# Patient Record
Sex: Female | Born: 1975
Health system: Southern US, Community
[De-identification: ages and names within clinical notes are randomized; demographics above are authoritative.]

## PROBLEM LIST (undated history)

## (undated) DIAGNOSIS — G720 Drug-induced myopathy: Secondary | ICD-10-CM

## (undated) DIAGNOSIS — R51 Headache: Secondary | ICD-10-CM

## (undated) DIAGNOSIS — G5602 Carpal tunnel syndrome, left upper limb: Secondary | ICD-10-CM

## (undated) DIAGNOSIS — E7800 Pure hypercholesterolemia, unspecified: Secondary | ICD-10-CM

## (undated) DIAGNOSIS — I10 Essential (primary) hypertension: Secondary | ICD-10-CM

## (undated) DIAGNOSIS — R519 Headache, unspecified: Secondary | ICD-10-CM

## (undated) DIAGNOSIS — E1140 Type 2 diabetes mellitus with diabetic neuropathy, unspecified: Secondary | ICD-10-CM

## (undated) DIAGNOSIS — T4145XA Adverse effect of unspecified anesthetic, initial encounter: Secondary | ICD-10-CM

## (undated) DIAGNOSIS — E109 Type 1 diabetes mellitus without complications: Secondary | ICD-10-CM

## (undated) DIAGNOSIS — M62838 Other muscle spasm: Secondary | ICD-10-CM

## (undated) DIAGNOSIS — J45909 Unspecified asthma, uncomplicated: Secondary | ICD-10-CM

## (undated) DIAGNOSIS — G2581 Restless legs syndrome: Secondary | ICD-10-CM

## (undated) DIAGNOSIS — E785 Hyperlipidemia, unspecified: Secondary | ICD-10-CM

## (undated) DIAGNOSIS — H4312 Vitreous hemorrhage, left eye: Secondary | ICD-10-CM

## (undated) DIAGNOSIS — J189 Pneumonia, unspecified organism: Secondary | ICD-10-CM

## (undated) DIAGNOSIS — K219 Gastro-esophageal reflux disease without esophagitis: Secondary | ICD-10-CM

## (undated) DIAGNOSIS — E11319 Type 2 diabetes mellitus with unspecified diabetic retinopathy without macular edema: Secondary | ICD-10-CM

## (undated) HISTORY — DX: Essential (primary) hypertension: I10

## (undated) HISTORY — PX: LASER PHOTO ABLATION: SHX5942

## (undated) HISTORY — DX: Other muscle spasm: M62.838

## (undated) HISTORY — PX: WISDOM TOOTH EXTRACTION: SHX21

## (undated) HISTORY — DX: Unspecified asthma, uncomplicated: J45.909

## (undated) HISTORY — DX: Pure hypercholesterolemia, unspecified: E78.00

## (undated) HISTORY — DX: Drug-induced myopathy: G72.0

## (undated) HISTORY — DX: Hyperlipidemia, unspecified: E78.5

---

## 1995-03-31 HISTORY — PX: DILATION AND CURETTAGE OF UTERUS: SHX78

## 1999-04-17 ENCOUNTER — Emergency Department (HOSPITAL_COMMUNITY): Admission: EM | Admit: 1999-04-17 | Discharge: 1999-04-18 | Payer: Self-pay | Admitting: Emergency Medicine

## 1999-04-17 ENCOUNTER — Inpatient Hospital Stay (HOSPITAL_COMMUNITY): Admission: AD | Admit: 1999-04-17 | Discharge: 1999-04-17 | Payer: Self-pay | Admitting: Obstetrics & Gynecology

## 1999-04-18 ENCOUNTER — Emergency Department (HOSPITAL_COMMUNITY): Admission: EM | Admit: 1999-04-18 | Discharge: 1999-04-18 | Payer: Self-pay | Admitting: Emergency Medicine

## 1999-04-19 ENCOUNTER — Emergency Department (HOSPITAL_COMMUNITY): Admission: EM | Admit: 1999-04-19 | Discharge: 1999-04-19 | Payer: Self-pay | Admitting: Emergency Medicine

## 2000-03-30 HISTORY — PX: TUBAL LIGATION: SHX77

## 2000-07-19 ENCOUNTER — Other Ambulatory Visit: Admission: RE | Admit: 2000-07-19 | Discharge: 2000-07-19 | Payer: Self-pay | Admitting: Obstetrics and Gynecology

## 2000-07-22 ENCOUNTER — Encounter: Admission: RE | Admit: 2000-07-22 | Discharge: 2000-10-20 | Payer: Self-pay | Admitting: Obstetrics and Gynecology

## 2000-08-31 ENCOUNTER — Encounter: Payer: Self-pay | Admitting: Obstetrics and Gynecology

## 2000-08-31 ENCOUNTER — Ambulatory Visit (HOSPITAL_COMMUNITY): Admission: RE | Admit: 2000-08-31 | Discharge: 2000-08-31 | Payer: Self-pay | Admitting: Obstetrics and Gynecology

## 2000-09-07 ENCOUNTER — Encounter: Payer: Self-pay | Admitting: Obstetrics and Gynecology

## 2000-09-07 ENCOUNTER — Ambulatory Visit (HOSPITAL_COMMUNITY): Admission: RE | Admit: 2000-09-07 | Discharge: 2000-09-07 | Payer: Self-pay | Admitting: Obstetrics and Gynecology

## 2000-09-23 ENCOUNTER — Inpatient Hospital Stay (HOSPITAL_COMMUNITY): Admission: AD | Admit: 2000-09-23 | Discharge: 2000-09-23 | Payer: Self-pay | Admitting: Obstetrics and Gynecology

## 2000-11-17 ENCOUNTER — Inpatient Hospital Stay (HOSPITAL_COMMUNITY): Admission: AD | Admit: 2000-11-17 | Discharge: 2000-11-17 | Payer: Self-pay | Admitting: Obstetrics and Gynecology

## 2000-11-18 ENCOUNTER — Inpatient Hospital Stay (HOSPITAL_COMMUNITY): Admission: AD | Admit: 2000-11-18 | Discharge: 2000-11-18 | Payer: Self-pay | Admitting: Obstetrics and Gynecology

## 2000-12-02 ENCOUNTER — Inpatient Hospital Stay (HOSPITAL_COMMUNITY): Admission: AD | Admit: 2000-12-02 | Discharge: 2000-12-02 | Payer: Self-pay | Admitting: Obstetrics and Gynecology

## 2000-12-08 ENCOUNTER — Inpatient Hospital Stay (HOSPITAL_COMMUNITY): Admission: AD | Admit: 2000-12-08 | Discharge: 2000-12-08 | Payer: Self-pay | Admitting: Obstetrics and Gynecology

## 2000-12-14 ENCOUNTER — Encounter (HOSPITAL_COMMUNITY): Admission: RE | Admit: 2000-12-14 | Discharge: 2001-01-13 | Payer: Self-pay | Admitting: Obstetrics and Gynecology

## 2000-12-14 ENCOUNTER — Inpatient Hospital Stay (HOSPITAL_COMMUNITY): Admission: AD | Admit: 2000-12-14 | Discharge: 2000-12-14 | Payer: Self-pay | Admitting: Obstetrics and Gynecology

## 2000-12-27 ENCOUNTER — Inpatient Hospital Stay (HOSPITAL_COMMUNITY): Admission: AD | Admit: 2000-12-27 | Discharge: 2000-12-27 | Payer: Self-pay | Admitting: Obstetrics and Gynecology

## 2001-01-13 ENCOUNTER — Inpatient Hospital Stay (HOSPITAL_COMMUNITY): Admission: AD | Admit: 2001-01-13 | Discharge: 2001-01-13 | Payer: Self-pay | Admitting: Obstetrics and Gynecology

## 2001-01-14 ENCOUNTER — Inpatient Hospital Stay (HOSPITAL_COMMUNITY): Admission: AD | Admit: 2001-01-14 | Discharge: 2001-01-17 | Payer: Self-pay | Admitting: Obstetrics and Gynecology

## 2001-01-14 ENCOUNTER — Encounter (INDEPENDENT_AMBULATORY_CARE_PROVIDER_SITE_OTHER): Payer: Self-pay

## 2001-01-19 ENCOUNTER — Encounter: Admission: RE | Admit: 2001-01-19 | Discharge: 2001-02-18 | Payer: Self-pay | Admitting: Obstetrics and Gynecology

## 2001-09-13 ENCOUNTER — Emergency Department (HOSPITAL_COMMUNITY): Admission: EM | Admit: 2001-09-13 | Discharge: 2001-09-13 | Payer: Self-pay | Admitting: *Deleted

## 2002-06-08 ENCOUNTER — Emergency Department (HOSPITAL_COMMUNITY): Admission: EM | Admit: 2002-06-08 | Discharge: 2002-06-08 | Payer: Self-pay | Admitting: Emergency Medicine

## 2002-06-08 ENCOUNTER — Encounter: Payer: Self-pay | Admitting: Emergency Medicine

## 2002-06-20 ENCOUNTER — Encounter: Admission: RE | Admit: 2002-06-20 | Discharge: 2002-06-20 | Payer: Self-pay | Admitting: Internal Medicine

## 2002-06-28 ENCOUNTER — Emergency Department (HOSPITAL_COMMUNITY): Admission: EM | Admit: 2002-06-28 | Discharge: 2002-06-28 | Payer: Self-pay

## 2003-01-21 ENCOUNTER — Emergency Department (HOSPITAL_COMMUNITY): Admission: EM | Admit: 2003-01-21 | Discharge: 2003-01-21 | Payer: Self-pay | Admitting: Emergency Medicine

## 2003-07-20 ENCOUNTER — Emergency Department (HOSPITAL_COMMUNITY): Admission: EM | Admit: 2003-07-20 | Discharge: 2003-07-20 | Payer: Self-pay | Admitting: Family Medicine

## 2003-08-02 ENCOUNTER — Encounter: Admission: RE | Admit: 2003-08-02 | Discharge: 2003-08-02 | Payer: Self-pay | Admitting: Internal Medicine

## 2003-08-02 ENCOUNTER — Ambulatory Visit (HOSPITAL_COMMUNITY): Admission: RE | Admit: 2003-08-02 | Discharge: 2003-08-02 | Payer: Self-pay | Admitting: Internal Medicine

## 2003-10-25 ENCOUNTER — Encounter: Admission: RE | Admit: 2003-10-25 | Discharge: 2003-10-25 | Payer: Self-pay | Admitting: Internal Medicine

## 2005-10-28 ENCOUNTER — Emergency Department (HOSPITAL_COMMUNITY): Admission: EM | Admit: 2005-10-28 | Discharge: 2005-10-28 | Payer: Self-pay | Admitting: Emergency Medicine

## 2005-11-02 ENCOUNTER — Emergency Department (HOSPITAL_COMMUNITY): Admission: EM | Admit: 2005-11-02 | Discharge: 2005-11-02 | Payer: Self-pay | Admitting: Emergency Medicine

## 2007-01-04 ENCOUNTER — Emergency Department (HOSPITAL_COMMUNITY): Admission: EM | Admit: 2007-01-04 | Discharge: 2007-01-04 | Payer: Self-pay | Admitting: Emergency Medicine

## 2007-01-10 ENCOUNTER — Ambulatory Visit (HOSPITAL_COMMUNITY): Admission: RE | Admit: 2007-01-10 | Discharge: 2007-01-10 | Payer: Self-pay | Admitting: Preventative Medicine

## 2007-06-03 ENCOUNTER — Emergency Department (HOSPITAL_COMMUNITY): Admission: EM | Admit: 2007-06-03 | Discharge: 2007-06-03 | Payer: Self-pay | Admitting: Emergency Medicine

## 2008-04-04 ENCOUNTER — Inpatient Hospital Stay (HOSPITAL_COMMUNITY): Admission: EM | Admit: 2008-04-04 | Discharge: 2008-04-09 | Payer: Self-pay | Admitting: Emergency Medicine

## 2009-11-17 ENCOUNTER — Emergency Department (HOSPITAL_COMMUNITY): Admission: EM | Admit: 2009-11-17 | Discharge: 2009-11-17 | Payer: Self-pay | Admitting: Emergency Medicine

## 2010-03-27 ENCOUNTER — Emergency Department (HOSPITAL_COMMUNITY)
Admission: EM | Admit: 2010-03-27 | Discharge: 2010-03-27 | Payer: Self-pay | Source: Home / Self Care | Admitting: Emergency Medicine

## 2010-06-09 LAB — URINALYSIS, ROUTINE W REFLEX MICROSCOPIC
Bilirubin Urine: NEGATIVE
Glucose, UA: 100 mg/dL — AB
Hgb urine dipstick: NEGATIVE
Nitrite: NEGATIVE
Protein, ur: NEGATIVE mg/dL
Urobilinogen, UA: 1 mg/dL (ref 0.0–1.0)
pH: 6.5 (ref 5.0–8.0)

## 2010-06-09 LAB — POCT I-STAT, CHEM 8
BUN: 20 mg/dL (ref 6–23)
Calcium, Ion: 1.16 mmol/L (ref 1.12–1.32)
Chloride: 105 meq/L (ref 96–112)
Creatinine, Ser: 0.7 mg/dL (ref 0.4–1.2)
Glucose, Bld: 164 mg/dL — ABNORMAL HIGH (ref 70–99)
HCT: 42 % (ref 36.0–46.0)
Hemoglobin: 14.3 g/dL (ref 12.0–15.0)
Potassium: 3.8 meq/L (ref 3.5–5.1)
Sodium: 141 meq/L (ref 135–145)
TCO2: 29 mmol/L (ref 0–100)

## 2010-06-09 LAB — GLUCOSE, CAPILLARY
Glucose-Capillary: 132 mg/dL — ABNORMAL HIGH (ref 70–99)
Glucose-Capillary: 205 mg/dL — ABNORMAL HIGH (ref 70–99)

## 2010-06-09 LAB — POCT PREGNANCY, URINE: Preg Test, Ur: NEGATIVE

## 2010-06-12 ENCOUNTER — Emergency Department (HOSPITAL_COMMUNITY)
Admission: EM | Admit: 2010-06-12 | Discharge: 2010-06-12 | Disposition: A | Discharge status: Home / Self Care | Payer: Medicaid Other | Attending: Emergency Medicine | Admitting: Emergency Medicine

## 2010-06-12 DIAGNOSIS — H53149 Visual discomfort, unspecified: Secondary | ICD-10-CM | POA: Insufficient documentation

## 2010-06-12 DIAGNOSIS — R112 Nausea with vomiting, unspecified: Secondary | ICD-10-CM | POA: Insufficient documentation

## 2010-06-12 DIAGNOSIS — E119 Type 2 diabetes mellitus without complications: Secondary | ICD-10-CM | POA: Insufficient documentation

## 2010-06-12 DIAGNOSIS — Z794 Long term (current) use of insulin: Secondary | ICD-10-CM | POA: Insufficient documentation

## 2010-06-12 DIAGNOSIS — G43909 Migraine, unspecified, not intractable, without status migrainosus: Secondary | ICD-10-CM | POA: Insufficient documentation

## 2010-06-12 DIAGNOSIS — I10 Essential (primary) hypertension: Secondary | ICD-10-CM | POA: Insufficient documentation

## 2010-06-16 ENCOUNTER — Emergency Department (HOSPITAL_COMMUNITY): Payer: Medicaid Other

## 2010-06-16 ENCOUNTER — Emergency Department (HOSPITAL_COMMUNITY)
Admission: EM | Admit: 2010-06-16 | Discharge: 2010-06-16 | Disposition: A | Discharge status: Home / Self Care | Payer: Medicaid Other | Attending: Emergency Medicine | Admitting: Emergency Medicine

## 2010-06-16 ENCOUNTER — Inpatient Hospital Stay (INDEPENDENT_AMBULATORY_CARE_PROVIDER_SITE_OTHER)
Admission: RE | Admit: 2010-06-16 | Discharge: 2010-06-16 | Disposition: A | Discharge status: Home / Self Care | Payer: Self-pay | Source: Ambulatory Visit | Attending: Emergency Medicine | Admitting: Emergency Medicine

## 2010-06-16 DIAGNOSIS — E119 Type 2 diabetes mellitus without complications: Secondary | ICD-10-CM | POA: Insufficient documentation

## 2010-06-16 DIAGNOSIS — M6281 Muscle weakness (generalized): Secondary | ICD-10-CM

## 2010-06-16 DIAGNOSIS — R209 Unspecified disturbances of skin sensation: Secondary | ICD-10-CM | POA: Insufficient documentation

## 2010-06-16 DIAGNOSIS — I10 Essential (primary) hypertension: Secondary | ICD-10-CM | POA: Insufficient documentation

## 2010-06-16 DIAGNOSIS — Z794 Long term (current) use of insulin: Secondary | ICD-10-CM | POA: Insufficient documentation

## 2010-06-16 DIAGNOSIS — R51 Headache: Secondary | ICD-10-CM | POA: Insufficient documentation

## 2010-06-16 DIAGNOSIS — F17200 Nicotine dependence, unspecified, uncomplicated: Secondary | ICD-10-CM | POA: Insufficient documentation

## 2010-06-16 DIAGNOSIS — R29898 Other symptoms and signs involving the musculoskeletal system: Secondary | ICD-10-CM | POA: Insufficient documentation

## 2010-06-16 LAB — COMPREHENSIVE METABOLIC PANEL
AST: 18 U/L (ref 0–37)
Albumin: 3.8 g/dL (ref 3.5–5.2)
BUN: 13 mg/dL (ref 6–23)
Calcium: 9.3 mg/dL (ref 8.4–10.5)
GFR calc Af Amer: >60 mL/min (ref >60)
Potassium: 4.1 mEq/L (ref 3.5–5.1)
Total Bilirubin: 0.2 mg/dL — ABNORMAL LOW (ref 0.3–1.2)
Total Protein: 6.8 g/dL (ref 6.0–8.3)

## 2010-06-16 LAB — DIFFERENTIAL
Basophils Relative: 0 % (ref 0–1)
Eosinophils Absolute: 0.1 10*3/uL (ref 0.0–0.7)
Eosinophils Relative: 1 % (ref 0–5)

## 2010-06-16 LAB — URINALYSIS, ROUTINE W REFLEX MICROSCOPIC
Protein, ur: NEGATIVE mg/dL
Urobilinogen, UA: 1 mg/dL (ref 0.0–1.0)

## 2010-06-16 LAB — CBC
HCT: 40.7 % (ref 36.0–46.0)
MCH: 30.7 pg (ref 26.0–34.0)
MCHC: 34.6 g/dL (ref 30.0–36.0)
MCV: 88.7 fL (ref 78.0–100.0)
Platelets: 223 10*3/uL (ref 150–400)
RDW: 12.2 % (ref 11.5–15.5)
WBC: 10.2 10*3/uL (ref 4.0–10.5)

## 2010-06-16 LAB — URINE MICROSCOPIC-ADD ON

## 2010-07-14 LAB — COMPREHENSIVE METABOLIC PANEL
ALT: 49 U/L — ABNORMAL HIGH (ref 0–35)
ALT: 50 U/L — ABNORMAL HIGH (ref 0–35)
ALT: 76 U/L — ABNORMAL HIGH (ref 0–35)
AST: 27 U/L (ref 0–37)
AST: 37 U/L (ref 0–37)
AST: 40 U/L — ABNORMAL HIGH (ref 0–37)
AST: 42 U/L — ABNORMAL HIGH (ref 0–37)
Albumin: 2.3 g/dL — ABNORMAL LOW (ref 3.5–5.2)
Albumin: 2.4 g/dL — ABNORMAL LOW (ref 3.5–5.2)
Albumin: 2.5 g/dL — ABNORMAL LOW (ref 3.5–5.2)
Albumin: 2.9 g/dL — ABNORMAL LOW (ref 3.5–5.2)
Alkaline Phosphatase: 100 U/L (ref 39–117)
Alkaline Phosphatase: 100 U/L (ref 39–117)
Alkaline Phosphatase: 101 U/L (ref 39–117)
BUN: 4 mg/dL — ABNORMAL LOW (ref 6–23)
BUN: 6 mg/dL (ref 6–23)
BUN: 6 mg/dL (ref 6–23)
CO2: 25 mEq/L (ref 19–32)
Calcium: 8.5 mg/dL (ref 8.4–10.5)
Calcium: 8.5 mg/dL (ref 8.4–10.5)
Chloride: 101 mEq/L (ref 96–112)
Chloride: 103 mEq/L (ref 96–112)
Chloride: 105 mEq/L (ref 96–112)
Chloride: 107 mEq/L (ref 96–112)
Creatinine, Ser: 0.55 mg/dL (ref 0.4–1.2)
Creatinine, Ser: 0.64 mg/dL (ref 0.4–1.2)
Creatinine, Ser: 0.8 mg/dL (ref 0.4–1.2)
GFR calc Af Amer: >60 mL/min (ref >60)
GFR calc Af Amer: >60 mL/min (ref >60)
GFR calc Af Amer: >60 mL/min (ref >60)
GFR calc Af Amer: >60 mL/min (ref >60)
GFR calc non Af Amer: >60 mL/min (ref >60)
Glucose, Bld: 148 mg/dL — ABNORMAL HIGH (ref 70–99)
Glucose, Bld: 155 mg/dL — ABNORMAL HIGH (ref 70–99)
Glucose, Bld: 199 mg/dL — ABNORMAL HIGH (ref 70–99)
Potassium: 3.4 mEq/L — ABNORMAL LOW (ref 3.5–5.1)
Potassium: 3.5 mEq/L (ref 3.5–5.1)
Potassium: 3.9 mEq/L (ref 3.5–5.1)
Potassium: 4.1 mEq/L (ref 3.5–5.1)
Sodium: 128 mEq/L — ABNORMAL LOW (ref 135–145)
Sodium: 134 mEq/L — ABNORMAL LOW (ref 135–145)
Sodium: 134 mEq/L — ABNORMAL LOW (ref 135–145)
Total Bilirubin: 1.4 mg/dL — ABNORMAL HIGH (ref 0.3–1.2)
Total Bilirubin: 1.5 mg/dL — ABNORMAL HIGH (ref 0.3–1.2)
Total Bilirubin: 1.6 mg/dL — ABNORMAL HIGH (ref 0.3–1.2)
Total Bilirubin: 1.7 mg/dL — ABNORMAL HIGH (ref 0.3–1.2)
Total Protein: 5.9 g/dL — ABNORMAL LOW (ref 6.0–8.3)
Total Protein: 6 g/dL (ref 6.0–8.3)
Total Protein: 6.9 g/dL (ref 6.0–8.3)

## 2010-07-14 LAB — CBC
HCT: 26.7 % — ABNORMAL LOW (ref 36.0–46.0)
HCT: 27.5 % — ABNORMAL LOW (ref 36.0–46.0)
HCT: 28.9 % — ABNORMAL LOW (ref 36.0–46.0)
HCT: 34.6 % — ABNORMAL LOW (ref 36.0–46.0)
Hemoglobin: 10 g/dL — ABNORMAL LOW (ref 12.0–15.0)
Hemoglobin: 11.9 g/dL — ABNORMAL LOW (ref 12.0–15.0)
Hemoglobin: 9.2 g/dL — ABNORMAL LOW (ref 12.0–15.0)
Hemoglobin: 9.5 g/dL — ABNORMAL LOW (ref 12.0–15.0)
MCV: 87.3 fL (ref 78.0–100.0)
MCV: 87.4 fL (ref 78.0–100.0)
MCV: 87.5 fL (ref 78.0–100.0)
Platelets: 109 10*3/uL — ABNORMAL LOW (ref 150–400)
Platelets: 162 10*3/uL (ref 150–400)
Platelets: 210 10*3/uL (ref 150–400)
RBC: 3.05 MIL/uL — ABNORMAL LOW (ref 3.87–5.11)
RBC: 3.16 MIL/uL — ABNORMAL LOW (ref 3.87–5.11)
RDW: 14.1 % (ref 11.5–15.5)
RDW: 14.2 % (ref 11.5–15.5)
WBC: 3.7 10*3/uL — ABNORMAL LOW (ref 4.0–10.5)
WBC: 4.3 10*3/uL (ref 4.0–10.5)
WBC: 4.5 10*3/uL (ref 4.0–10.5)
WBC: 4.5 10*3/uL (ref 4.0–10.5)

## 2010-07-14 LAB — GLUCOSE, CAPILLARY
Glucose-Capillary: 125 mg/dL — ABNORMAL HIGH (ref 70–99)
Glucose-Capillary: 143 mg/dL — ABNORMAL HIGH (ref 70–99)
Glucose-Capillary: 162 mg/dL — ABNORMAL HIGH (ref 70–99)
Glucose-Capillary: 166 mg/dL — ABNORMAL HIGH (ref 70–99)
Glucose-Capillary: 183 mg/dL — ABNORMAL HIGH (ref 70–99)
Glucose-Capillary: 186 mg/dL — ABNORMAL HIGH (ref 70–99)
Glucose-Capillary: 198 mg/dL — ABNORMAL HIGH (ref 70–99)
Glucose-Capillary: 225 mg/dL — ABNORMAL HIGH (ref 70–99)
Glucose-Capillary: 254 mg/dL — ABNORMAL HIGH (ref 70–99)
Glucose-Capillary: 263 mg/dL — ABNORMAL HIGH (ref 70–99)
Glucose-Capillary: 275 mg/dL — ABNORMAL HIGH (ref 70–99)
Glucose-Capillary: 288 mg/dL — ABNORMAL HIGH (ref 70–99)
Glucose-Capillary: 326 mg/dL — ABNORMAL HIGH (ref 70–99)

## 2010-07-14 LAB — URINE MICROSCOPIC-ADD ON

## 2010-07-14 LAB — URINALYSIS, ROUTINE W REFLEX MICROSCOPIC
Ketones, ur: 40 mg/dL — AB
Protein, ur: 100 mg/dL — AB
Specific Gravity, Urine: 1.045 — ABNORMAL HIGH (ref 1.005–1.030)

## 2010-07-14 LAB — HEMOGLOBIN A1C
Hgb A1c MFr Bld: 10.5 % — ABNORMAL HIGH (ref 4.6–6.1)
Mean Plasma Glucose: 255 mg/dL

## 2010-07-14 LAB — HEPATIC FUNCTION PANEL
Bilirubin, Direct: 0.9 mg/dL — ABNORMAL HIGH (ref 0.0–0.3)
Indirect Bilirubin: 1.2 mg/dL — ABNORMAL HIGH (ref 0.3–0.9)
Total Bilirubin: 2.1 mg/dL — ABNORMAL HIGH (ref 0.3–1.2)

## 2010-07-14 LAB — DIFFERENTIAL
Eosinophils Absolute: 0 10*3/uL (ref 0.0–0.7)
Lymphocytes Relative: 48 % — ABNORMAL HIGH (ref 12–46)
Lymphs Abs: 2.4 10*3/uL (ref 0.7–4.0)
Monocytes Absolute: 0.7 10*3/uL (ref 0.1–1.0)
Monocytes Relative: 14 % — ABNORMAL HIGH (ref 3–12)
Neutrophils Relative %: 37 % — ABNORMAL LOW (ref 43–77)

## 2010-07-14 LAB — CK TOTAL AND CKMB (NOT AT ARMC)
Relative Index: INVALID (ref 0.0–2.5)
Total CK: 52 U/L (ref 7–177)

## 2010-07-14 LAB — CULTURE, BLOOD (ROUTINE X 2): Culture: NO GROWTH

## 2010-07-14 LAB — LIPID PANEL
HDL: <10 mg/dL — ABNORMAL LOW (ref >39)
VLDL: 42 mg/dL — ABNORMAL HIGH (ref 0–40)

## 2010-07-14 LAB — MONONUCLEOSIS SCREEN: Mono Screen: NEGATIVE

## 2010-07-14 LAB — HEPATITIS PANEL, ACUTE
HCV Ab: NEGATIVE
Hep A IgM: NEGATIVE

## 2010-07-14 LAB — LIPASE, BLOOD: Lipase: 35 U/L (ref 11–59)

## 2010-08-12 NOTE — Discharge Summary (Signed)
[name], [name]              ACCOUNT NO.:  [id number]   MEDICAL RECORD NO.:  [id number]          PATIENT TYPE:  INP   LOCATION:  1516                         FACILITY:  [facility]   PHYSICIAN:  Hillery Aldo, M.D.   DATE OF BIRTH:  July 24, 1975   DATE OF ADMISSION:  04/03/2008  DATE OF DISCHARGE:  04/09/2008                               DISCHARGE SUMMARY   PRIMARY CARE PHYSICIAN:  The patient visits various physicians at  [facility].   DISCHARGE DIAGNOSES:  1. Acute hepatitis with jaundice.  2. Uncontrolled diabetes.  3. Viral illness with bronchitis and fever.  4. Hypertension.  5. Normocytic anemia.  6. Pancytopenia.  7. Anxiety.  8. Fatty liver.   DISCHARGE MEDICATIONS:  1. Klonopin 0.5 mg q.4 h p.r.n.  2. Amaryl 4 mg p.o. daily.   CONSULTATIONS:  None.   BRIEF ADMISSION HISTORY OF PRESENT ILLNESS:  The patient is a 35-year-  old female who presented to the hospital with a chief complaint of fever  and scleral icterus.  The patient was noted to have elevated liver  function studies on initial evaluation and subsequently was admitted for  further workup and treatment.  For the full details, please see the  dictated report done by Dr. Orvan Falconer.   PROCEDURES AND DIAGNOSTIC STUDIES:  1. Chest x-ray on April 03, 2008 showed central bronchial wall      thickening which may be representative of bronchitis or reactive      airways disease.  2. Abdominal ultrasound on April 05, 2008, showed fatty infiltration      of the liver.  No acute process or explanation for abdominal pain      and fever.  Prominent kidneys bilaterally which may be within      normal variation.   DISCHARGE LABORATORY VALUES:  Sodium was 137, potassium 4.1, chloride  105, bicarb 26, BUN 4, creatinine 0.55, glucose 140, total bilirubin  1.4, alkaline phosphatase 99, AST 37, ALT 47, total protein 6.0, albumin  2.5.  White blood cell count was 4.5, hemoglobin 10, hematocrit  28.9,  platelets 109.   HOSPITAL COURSE:  1. Acute hepatitis with jaundice thought to be secondary to a viral      illness.  The patient was admitted and a diagnostic evaluation was      undertaken.  Viral hepatitis studies were negative.  A mono spot      was done and was negative as well.  Abdominal ultrasound also did      not reveal any evidence of cholecystitis or abnormality of the      biliary tract system.  Over the course of her hospital stay, her      liver function studies were evaluated and monitored and improved      steadily.  At this point, the patient no longer has scleral icterus      and is asymptomatic.  It is felt that her acute hepatitis was      likely due to a viral illness and that she probably has some      concomitant splenomegaly with mild  pancytopenia, the patient was      encouraged to avoid any heavy lifting, avoid alcohol and excessive      Tylenol and follow up with her primary care physician for follow-up      and to assure her liver function studies have reverted to normal      along with her blood counts.  2. Uncontrolled diabetes.  The patient had been diet controlled prior      to presentation.  Hemoglobin A1c value was checked and found to be      10.1 which corresponds to a mean plasma glucose of 255.  While in      the hospital, the patient was maintained on a combination of Lantus      and sliding scale insulin.  We will discharge her on Amaryl and she      should follow up with her primary care physician for further      evaluation of her glycemic control and titration of her medications      as needed.  3. Hypertension.  The patient was not hypertensive here in the      hospital, and no medication has been started for this.  Apparently,      the patient maintained a low-salt diet which controlled her      hypertension.  4. Normocytic anemia/pancytopenia.  This is likely multifactorial.      She is a menstruating female, which likely  contributes to her      anemia.  She has mild thrombocytopenia and her white blood cell      count has now normalized.  I suspect this is secondary to her viral      illness with possible splenomegaly and sequestration.  She should      have a follow-up CBC done in 1-2 weeks by her primary care      physician to ensure that this is resolving.  5. Anxiety.  The patient was provided with Klonopin on an as-needed      basis.   DISPOSITION:  The patient is medically stable and will be discharged  home.  She is encouraged to follow up with the [facility]  Department in 1-2 weeks for lab work and further evaluation of her  diabetes control.   Time spent coordinating care for discharge and discharge instructions  equals 35 minutes.      Hillery Aldo, M.D.  Electronically Signed     CR/MEDQ  D:  04/09/2008  T:  04/09/2008  Job:  [id number]   cc:   [facility]

## 2010-08-12 NOTE — H&P (Signed)
[name], [name]              ACCOUNT NO.:  [id number]   MEDICAL RECORD NO.:  [id number]          PATIENT TYPE:  EMS   LOCATION:  ED                           FACILITY:  [facility]   PHYSICIAN:  Vania Rea, M.D. DATE OF BIRTH:  Oct 08, 1975   DATE OF ADMISSION:  04/04/2008  DATE OF DISCHARGE:                              HISTORY & PHYSICAL   PRIMARY CARE PHYSICIAN:  Unassigned.   CHIEF COMPLAINT:  Fever and jaundice.   HISTORY OF PRESENT ILLNESS:  This is a 35 year old Caucasian lady who  was been having fevers, generalized body ache, nausea and vomiting since  before Christmas.  The patient went to an Urgent Care Facility and says  she received a steroid injection and some antibiotics.  She does not  know the names of either of these medications.  She was told she would  feel better, but she continued to feel sick throughout Christmas and  eventually returned for follow-up yesterday.  In addition, she also  noted that her eyes started to get yellow.  The patient is reevaluated  at the Urgent Care today.  Jaundice was noted and a persistent fever was  noted, and she was sent to our emergency room for further assessment.   The patient does have a history of diabetes and used to take oral  medications, but says since she lost 15 pounds her blood sugars  normalized and she is suppose to be checking to make sure they keep  normal, but she has not been checking.  Again, we note that she recently  got a steroid injection.   The patient notes that she has a 51-year-old daughter and just before the  patient got sick, the 27-year-old daughter had a temperature up to 104,  but that resolved without any treatment.  There is nobody else in the  family sick.   PAST MEDICAL HISTORY:  1. Hypertension, controlled on diet.  2. Diabetes, controlled on diet.  3. Anxiety attacks.   MEDICATIONS:  Klonopin 0.5 mg every 4 hours p.r.n., but usually does not  have to take it more than once or  twice a day.   ALLERGIES:  MORPHINE AND DEMEROL BOTH CAUSE HIVES AND ITCHING.   SOCIAL HISTORY:  Smokes one-pack per day.  Denies alcohol or illicit  drug use.  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   FAMILY HISTORY:  Significant for diabetes in both parents.   REVIEW OF SYSTEMS:  Other than noted above, a 10-point review of systems  is unremarkable.   PHYSICAL EXAMINATION:  GENERAL:  A young Caucasian lady lying on the  stretcher, mildly ill-looking.  VITAL SIGNS:  Temperature is 102.2, pulse initially 151, now 84,  respirations 18, blood pressure 97/62.  She is saturating at 99% on room  air.  HEENT:  Her pupils are round and equal.  Her mucous membranes are pink.  She is mildly icteric, mildly dehydrated.  NECK:  No cervical  lymphadenopathy or thyromegaly.  No jugulovenous  distention.  CHEST:  Clear to auscultation bilaterally.  CARDIOVASCULAR:  Regular rhythm without murmur.  ABDOMEN:  Obese and soft and nontender.  EXTREMITIES:  Without edema.  She has 2+ dorsalis pedis pulses equal  bilaterally.  CENTRAL NERVOUS SYSTEM:  Cranial nerves II-XII are grossly intact and  she has no focal neurologic deficit.   LABORATORY DATA:  Her white count is 4.9, hemoglobin slightly low at  7.9, MCV 7.5, platelets 210.  She has only 37% neutrophil, 48%  lymphocytes.  Serum chemistry; her sodium is 128, potassium 3.8,  chloride 93, CO2 of 26, glucose 237, BUN 14,  creatinine 0.8, total  protein 6.9, albumin 2.9, AST slightly elevated at 42, ALT slightly  elevated at 76, alk phos elevated at 170, total bilirubin elevated at  2.9.  Two-view chest shows central bronchial wall thickening  representing bronchitis or active airway disease.  Her monospot screen  is negative.  Her rapid strep screen is negative.  Her lipase is  negative.  Urinalysis shows  urine specific gravity of 1.045, greater  than 1000 glucose, 40 ketones, 100 protein.  Microscopy shows rare  yeast, otherwise unremarkable.   ASSESSMENT:  1. Acute viral syndrome with deranged liver function.  Differential      includes acute viral hepatitis versus an acute viral syndrome with      mild hepatic toxicity from medications previously received.  2. Diabetes type 2, probably aggravated by steroids.  3. History of hypertension.  4. History of panic attacks.  5. Dehydration.   PLAN:  Will admit this lady for hydration, monitor her blood sugars,  check her hemoglobin A1c and will check an acute hepatitis.  In all  likelihood, this patient can be discharged home with symptomatic  treatment in a fairly short period.  Other plans as per orders.      Vania Rea, M.D.  Electronically Signed     LC/MEDQ  D:  04/04/2008  T:  04/04/2008  Job:  [id number]   cc:   Madilyn Fireman, PA-C  __________ [facility]

## 2010-08-15 NOTE — Discharge Summary (Signed)
[facility]  PatientOCEANE, [name] Visit Number: [id number] MRN: [id number]          Service Type: OBS Location: 910A 9106 01 Attending Physician:  Michaele Offer Dictated by:   Malachi Pro. Ambrose Mantle, M.D. Admit Date:  01/14/2001 Discharge Date: 01/17/2001                             Discharge Summary  HISTORY OF PRESENT ILLNESS:  This is a 35 year old white female, para 1-1-1-2, gravida 4, with estimated date of confinement of February 02, 2001, by 11 week ultrasound, had a non-stress test due to class B diabetes and hypertension. The non-stress test was reactive, however, her blood pressures were 160s to 180s over 100s to 110s, and with her being term, it was felt that she would benefit from delivery.  She had a prior cesarean section and desired a repeat cesarean section with tubal ligation.  The patient had been fairly well controlled with twice daily insulin.  She was treated with p.r.n. terbutaline. She developed hypertension at approximately 32 weeks, and was started on Procardia 30 mg q.d. when her PIH labs were normal.  Blood group and type was O- with a negative antibody, RPR was nonreactive, rubella immune, hepatitis B surface antigen negative, HIV negative, GC and chlamydia negative, triple screen normal.  Hemoglobin A1C was 8.4.  After admission to the hospital the patient had a repeat low transverse cervical cesarean section and tubal ligation by Dr. Derrel Nip.  The uterus was densely adherent to the rectus muscles.  She did have normal tubes and ovaries.  She delivered a viable female infant, 8 pounds, with Apgars of 8 at one and 9 at five minutes. Postpartum the patient did quite well.  She was treated with NPH insulin 10 units at night, and a sliding scale of regular insulin depending on her postprandial blood sugars.  Dr. Derrel Nip requested a consultation, but at this point the patient has not been seen by the nutrition  management team.  She is ambulating well, and is ready for discharge.  Her staples have been removed, and strips applied.  LABORATORY DATA:  The baby was Rh negative.  The mother was not an Rh immune globulin candidate.  Her initial hemoglobin was 14.7, hematocrit 42.7, white count 11,600, platelet count 183,000.  Followup hemoglobin 12.9, hematocrit 36.3.  PIH labs were normal.  Uric acid was 4.5, LDH 213, SGOT and PT 18 and 30, respectively.  FINAL DIAGNOSES: 1. Intrauterine pregnancy at 37+ weeks. 2. Hypertension. 3. Class B diabetes mellitus. 4. Voluntary sterilization.  OPERATION:  Low transverse cervical cesarean section and tubal ligation.  CONDITION ON DISCHARGE:  Improved.  DISCHARGE INSTRUCTIONS:  Our regular discharge instruction booklet.  DISCHARGE MEDICATIONS: 1. Darvocet-N 100 20 tablets one q.4-6h. p.r.n. pain. 2. NPH insulin 10 units at night. 3. Regular insulin depending on the one hour postprandial blood sugars.  If    the one hour postprandial blood sugar is less than 150, no regular insulin.    If it is between 151 and 200 then 3 units of regular insulin.  If it is    between 201 and 250 then 6 units of regular insulin.  If it is greater than    250 then 9 units of regular insulin.  ACTIVITY:  The patient is advised to avoid heavy lifting or strenuous activity.  Report any fever greater than 100 degrees.  Report  any unusual problems.  FOLLOWUP:  Return to the office in 10 to 14 days for followup examination.Dictated by:   Malachi Pro. Ambrose Mantle, M.D. Attending Physician:  Michaele Offer DD:  01/17/01 TD:  01/19/01 Job: [id number]

## 2010-08-15 NOTE — Op Note (Signed)
[facility]  Patient:    [name], [name] Visit Number: [id number] MRN: [id number]          Service Type: OBS Location: *N Attending Physician:  Oliver Pila Dictated by:   Zenaida Niece, M.D. Proc. Date: 01/14/01                             Operative Report  PREOPERATIVE DIAGNOSES:       Intrauterine pregnancy at 37 weeks, chronic hypertension, class B diabetes, prior cesarean section, desires surgical sterility.  POSTOPERATIVE DIAGNOSES:      Intrauterine pregnancy at 37 weeks, chronic hypertension, class B diabetes, prior cesarean section, desires surgical sterility.  PROCEDURE:                    Repeat low transverse cesarean section, bilateral partial salpingectomy.  SURGEON:                      Zenaida Niece, M.D.  ANESTHESIA:                   Spinal.  ESTIMATED BLOOD LOSS:         800 cc.  FINDINGS:                     Dense scarring of the uterus to the anterior abdominal wall, mostly to the rectus muscles.  She had otherwise normal appearing tubes and ovaries.  She delivered a viable female infant with Apgars of 8 and 9 that weighed 8 pounds.  PROCEDURE IN DETAIL:          Patient was taken to the operating room, placed in the sitting position.  Dr. Cristela Blue instilled spinal anesthesia and she was placed in the dorsal supine position with a left lateral tilt.  Her abdomen was prepped and draped in the usual sterile fashion and a Foley catheter inserted.  Her abdomen was entered via her previous Pfannenstiel incision.  I was able to get through the fascia down to the rectus muscles. The rectus muscles were then densely adhered to the anterior uterus.  I was not able to make a window in the muscles inferiorly enough to isolate the bladder and create a bladder flap and push the bladder inferiorly.  The uterus was then entered via a transverse incision which was extended bilaterally with bandage scissors.  Clear  amniotic fluid was noted upon rupturing the membranes.  The fetal vertex was delivered through the incision atraumatically.  The mouth and nares were suctioned and a loose nuchal cord was reduced.  Placenta delivered.  The remainder of the infant delivered.  The cord was doubly clamped and cut and the infant handed to the awaiting pediatric team.  Cord blood and a segment of cord for gas were obtained.  The placenta then delivered spontaneously.  The uterus was wiped dry with a clean lap pad and found to palpate normal.  The uterine incision was inspected and found to be free of extensions.  The incision was closed in one layer being a running locking layer with #1 Chromic.  The ______ required an embrocating layer also with #1 Chromic to aid in hemostasis.  The bladder was close to the inferior portion of the incision, but was able to be freed up inferiorly.  A defect in the myometrium superior to the incision was closed with Chromic to aid in  hemostasis.  I was then able to make windows in the scar tissue on each side to enable visualization of the tubes.  Both fallopian tubes were identified and traced to their fimbriated ends.  A knuckle of tube in each side was ligated with 0 plain gut suture and the knuckles were sharply removed.  On both sides both ostia were identified and the stumps were hemostatic.  The uterine incision was again inspected and bleeding controlled with electrocautery.  The rectus muscles were then closed in the midline with three interrupted sutures of #1 Chromic.  The subfascial space was irrigated and made hemostatic with electrocautery.  The fascia was closed in a running fashion starting at both ends and meeting in the middle with 0 Vicryl.  The subcutaneous tissue was irrigated and made hemostatic with electrocautery. The skin was then closed with staples and a sterile dressing.  Patient tolerated the procedure well and was taken to the recovery room in  stable condition. Dictated by:   Zenaida Niece, M.D. Attending Physician:  Oliver Pila DD:  01/14/01 TD:  01/17/01 Job: [id number]

## 2010-08-15 NOTE — H&P (Signed)
[facility]  Patient:    Diane, Hunt Visit Number: [id number] MRN: [id number]          Service Type: OBS Location: MATC Attending Physician:  Michaele Offer Dictated by:   Zenaida Niece, M.D. Admit Date:  01/13/2001 Discharge Date: 01/13/2001                           History and Physical  CHIEF COMPLAINT:              Hypertension.  HISTORY OF PRESENT ILLNESS:   This is a 35 year old white female gravida 4 para 1-1-1-2 with an EGA of 37+ weeks by an 11-week ultrasound with a due date of November 6 who was seen in antenatal testing for a nonstress test due to class B diabetes and hypertension.  The nonstress test was reactive; however, her blood pressures were 160s-180s/100s-110s and with her being term, it was felt that she would benefit from delivery.  She has a prior cesarean section and desires repeat cesarean section with tubal ligation, and is admitted for this at this time.  Prenatal care complicated by the above-mentioned class B diabetes.  She has been fairly well controlled with b.i.d. insulin.  She was treated with p.r.n. terbutaline for preterm contractions.  She developed hypertension at approximately 32 weeks and was started by Dr. Senaida Ores on Procardia XL 30 mg a day when her PIH labs were normal.  Her blood pressures were fairly well controlled - 140s-150s/100s in the office and 130s/70s at home.  However, on the day of admission she had the above-mentioned blood pressures.  She had been seen in triage on October 17 for elevated blood pressures in the office.  She had PIH labs which were normal and an NST which was reactive at that point.  PRENATAL LABORATORY DATA:     Blood type O negative with negative antibody screen.  RPR nonreactive.  Rubella immune.  Hepatitis B surface antigen negative.  HIV negative.  Gonorrhea and chlamydia negative.  Triple screen normal.  Hemoglobin A1c 8.4.  PAST OBSTETRICAL  HISTORY:     In 1993, she had a cesarean section at 40 weeks for fetal distress; baby weighed 7 pounds 6 ounces.  In October 1998 she had a repeat cesarean section at 36 weeks done for breech, preeclampsia, and Prader-Willi syndrome.  Baby weighed 4 pounds 8 ounces.  In 1997 she had a spontaneous abortion treated with D&C.  She did have gestational diabetes with her second pregnancy as well as preeclampsia.  PAST MEDICAL HISTORY:         Significant for class B diabetes for which she has been treated with insulin since April 2002.  She has occasional migraine headaches.  PAST SURGICAL HISTORY:        Cesarean section x 2 and a D&C.  ALLERGIES:                    MORPHINE and DEMEROL.  CURRENT MEDICATIONS:          1. Insulin 7 units of regular in the a.m.; in                                  the p.m. she gets 20 units of NPH and 7 units  of regular and she has been well controlled                                  on this.                               2. Procardia XL 30 mg a day.  SOCIAL HISTORY:               Patient is single but the father of the baby is involved and she denies alcohol, tobacco, or drug use.  FAMILY HISTORY:               Patient has a first cousin who had a child with mental retardation, and then the patients second child has Prader-Willi syndrome.  REVIEW OF SYSTEMS:            Negative.  PHYSICAL EXAMINATION:  VITAL SIGNS:                  Last weight 196 pounds.  Blood pressures 161-183/105-114.  NECK:                         Supple without lymphadenopathy or thyromegaly.  LUNGS:                        Clear to auscultation.  HEART:                        Regular rate and rhythm without murmur.  ABDOMEN:                      Gravid, nontender, with a fundal height of 39 cm on October 17 and a well-healed transverse incision.  EXTREMITIES:                  Have 1+ edema, are nontender, and DTRs are 2/4 and  symmetric.  VAGINAL:                      Today is deferred but on October 17 she was 3, 70, and -2 to -3 with a vertex presentation.  ASSESSMENT:                   Intrauterine pregnancy at 37 weeks with elevated blood pressures.  She has been on Procardia XL 30 mg but now has significantly elevated blood pressures.  She also had proteinuria in the office on October 17 although the remainder of her [facility] labs were normal.  She also has class B diabetes which has been well controlled with b.i.d. insulin.  Patient has a prior cesarean section and wishes to have this repeated, as well as a tubal ligation.  PLAN:                         Admit the patient for repeat cesarean section and tubal ligation. Dictated by:   Zenaida Niece, M.D. Attending Physician:  Michaele Offer DD:  01/14/01 TD:  01/15/01 Job: [id number]

## 2010-09-16 ENCOUNTER — Inpatient Hospital Stay (HOSPITAL_COMMUNITY): Payer: Medicaid Other

## 2010-09-16 ENCOUNTER — Inpatient Hospital Stay (HOSPITAL_COMMUNITY)
Admission: AD | Admit: 2010-09-16 | Discharge: 2010-09-16 | Disposition: A | Discharge status: Home / Self Care | Payer: Medicaid Other | Source: Ambulatory Visit | Attending: Obstetrics & Gynecology | Admitting: Obstetrics & Gynecology

## 2010-09-16 DIAGNOSIS — O99340 Other mental disorders complicating pregnancy, unspecified trimester: Secondary | ICD-10-CM | POA: Insufficient documentation

## 2010-09-16 DIAGNOSIS — R109 Unspecified abdominal pain: Secondary | ICD-10-CM | POA: Insufficient documentation

## 2010-09-16 DIAGNOSIS — O24919 Unspecified diabetes mellitus in pregnancy, unspecified trimester: Secondary | ICD-10-CM | POA: Insufficient documentation

## 2010-09-16 DIAGNOSIS — E119 Type 2 diabetes mellitus without complications: Secondary | ICD-10-CM | POA: Insufficient documentation

## 2010-09-16 DIAGNOSIS — F4310 Post-traumatic stress disorder, unspecified: Secondary | ICD-10-CM | POA: Insufficient documentation

## 2010-09-16 DIAGNOSIS — Z794 Long term (current) use of insulin: Secondary | ICD-10-CM | POA: Insufficient documentation

## 2010-09-16 DIAGNOSIS — Z79899 Other long term (current) drug therapy: Secondary | ICD-10-CM | POA: Insufficient documentation

## 2010-09-16 DIAGNOSIS — O99891 Other specified diseases and conditions complicating pregnancy: Secondary | ICD-10-CM | POA: Insufficient documentation

## 2010-09-16 LAB — WET PREP, GENITAL: Trich, Wet Prep: NONE SEEN

## 2010-09-16 LAB — COMPREHENSIVE METABOLIC PANEL
ALT: 26 U/L (ref 0–35)
AST: 22 U/L (ref 0–37)
Albumin: 3.4 g/dL — ABNORMAL LOW (ref 3.5–5.2)
CO2: 27 mEq/L (ref 19–32)
Calcium: 9.1 mg/dL (ref 8.4–10.5)
Chloride: 100 mEq/L (ref 96–112)
Creatinine, Ser: 0.69 mg/dL (ref 0.50–1.10)
GFR calc non Af Amer: >60 mL/min (ref >60)
Sodium: 137 mEq/L (ref 135–145)

## 2010-09-16 LAB — URINALYSIS, ROUTINE W REFLEX MICROSCOPIC
Glucose, UA: 100 mg/dL — AB
Ketones, ur: NEGATIVE mg/dL
Leukocytes, UA: NEGATIVE
Nitrite: NEGATIVE
Protein, ur: NEGATIVE mg/dL
pH: 6 (ref 5.0–8.0)

## 2010-09-16 LAB — CBC
MCHC: 34.5 g/dL (ref 30.0–36.0)
Platelets: 201 10*3/uL (ref 150–400)
RDW: 12.4 % (ref 11.5–15.5)
WBC: 10.1 10*3/uL (ref 4.0–10.5)

## 2010-09-16 LAB — POCT PREGNANCY, URINE: Preg Test, Ur: NEGATIVE

## 2010-09-17 LAB — GC/CHLAMYDIA PROBE AMP, GENITAL
Chlamydia, DNA Probe: NEGATIVE
GC Probe Amp, Genital: NEGATIVE

## 2010-10-29 ENCOUNTER — Other Ambulatory Visit (HOSPITAL_COMMUNITY)
Admission: RE | Admit: 2010-10-29 | Discharge: 2010-10-29 | Disposition: A | Discharge status: Home / Self Care | Payer: Medicaid Other | Source: Ambulatory Visit | Attending: Obstetrics and Gynecology | Admitting: Obstetrics and Gynecology

## 2010-10-29 ENCOUNTER — Encounter: Payer: Self-pay | Admitting: Obstetrics and Gynecology

## 2010-10-29 ENCOUNTER — Ambulatory Visit (INDEPENDENT_AMBULATORY_CARE_PROVIDER_SITE_OTHER): Payer: Medicaid Other | Admitting: Obstetrics and Gynecology

## 2010-10-29 VITALS — BP 122/86 | HR 112 | Temp 99.3°F | Ht 65.0 in | Wt 177.0 lb

## 2010-10-29 DIAGNOSIS — N938 Other specified abnormal uterine and vaginal bleeding: Secondary | ICD-10-CM

## 2010-10-29 DIAGNOSIS — Z01419 Encounter for gynecological examination (general) (routine) without abnormal findings: Secondary | ICD-10-CM | POA: Insufficient documentation

## 2010-10-29 DIAGNOSIS — N949 Unspecified condition associated with female genital organs and menstrual cycle: Secondary | ICD-10-CM

## 2010-10-29 DIAGNOSIS — R87810 Cervical high risk human papillomavirus (HPV) DNA test positive: Secondary | ICD-10-CM | POA: Insufficient documentation

## 2010-10-29 DIAGNOSIS — N946 Dysmenorrhea, unspecified: Secondary | ICD-10-CM

## 2010-10-29 MED ORDER — MEDROXYPROGESTERONE ACETATE 10 MG PO TABS: 10.0000 mg | ORAL_TABLET | Freq: Every day | ORAL | Status: DC

## 2010-10-29 NOTE — Progress Notes (Signed)
Addended by: Faythe Casa on: 10/29/2010 04:22 PM   Modules accepted: Orders

## 2010-10-29 NOTE — Progress Notes (Signed)
The patient is a 35 year old white female gravida 5 para 55. She is a juvenile diabetic on metformin plus insulin. Her main complaint is irregular and increasingly longer painful periods. She was recently seen in the emergency room where a pelvic ultrasound was completely normal with the exception of a retroverted uterus. Her hemoglobin was normal at that time. She wants to avoid Depo-Provera because of the problem with weight gain. We'll unable to treat her with oral contraceptives as she is a smoker. She describes the pain as being in her lower back and cramping as if she is in labor in the lower abdomen. My feeling is that the tube we retroverted uterus along with slightly increased bleeding with her periods is added to the problem. She has a tubal ligation. Therefore, we are starting her out on Provera tablets one a day for the first 10 days of each month. We will have her return in 6 Weeks to [facility] IUD. We were given her information literature on the IUD and talked to her about alternatives such as endometrial ablation.  The patient's last Pap smear was 5 years ago but they've always been normal.  Pelvic examination: External genitalia normal. BUS within normal limits. Vagina was clean and well rugated. The cervix is very anterior and parous. The uterus is in marked retroversion the adnexa could not be well palpated because of the habitus of the patient.  Pap smear was taken and the patient was given a prescription for Provera 10 mg a day sent to her pharmacy.

## 2010-11-17 ENCOUNTER — Telehealth: Payer: Self-pay | Admitting: *Deleted

## 2010-11-17 NOTE — Telephone Encounter (Signed)
Spoke w/pt and explained colposcopy procedure. She also expressed sx of UTI- pelvic heaviness and frequent urge to void but "nothing comes out". I offered to have pt come to clinic to submit urine specimen for u/a and culture. Pt states she has appt @ PCP later this week and will have it done there. No further questions.

## 2010-11-17 NOTE — Telephone Encounter (Signed)
Returned pt call. She stated that after her last visit, she took the 10 days of medication and did not get a period as she was expecting. She has had pre-menstrual-like cramping which has not resolved with ibuprofen. She states that she has to take care of her children and work but is not able to work because of the discomfort. She reports that she took naproxyn in the past with better results. I told pt that she may take naproxyn OTC- 2 tabs q 12h prn. Pt also had questions about her upcoming colposcopy appt for abnormal Pap. I was beginning to provide explanation to the pt and the call was disconnected. I called pt back and left a message that I would call her again to finish our conversation.

## 2010-12-03 ENCOUNTER — Ambulatory Visit: Payer: Medicaid Other | Admitting: Obstetrics and Gynecology

## 2010-12-22 LAB — CSF CULTURE W GRAM STAIN: Gram Stain: NONE SEEN

## 2010-12-22 LAB — URINE CULTURE: Colony Count: 80000

## 2010-12-22 LAB — DIFFERENTIAL
Basophils Absolute: 0
Eosinophils Absolute: 0
Eosinophils Relative: 0
Lymphocytes Relative: 40
Lymphs Abs: 2.7
Monocytes Absolute: 0.4

## 2010-12-22 LAB — CSF CELL COUNT WITH DIFFERENTIAL
RBC Count, CSF: 2500 — ABNORMAL HIGH
RBC Count, CSF: 80 — ABNORMAL HIGH
Tube #: 4

## 2010-12-22 LAB — COMPREHENSIVE METABOLIC PANEL
ALT: 20
AST: 17
Albumin: 3.7
Chloride: 104
Creatinine, Ser: 0.66
GFR calc Af Amer: >60
Sodium: 139
Total Bilirubin: 0.6

## 2010-12-22 LAB — URINALYSIS, ROUTINE W REFLEX MICROSCOPIC
Bilirubin Urine: NEGATIVE
Hgb urine dipstick: NEGATIVE
Ketones, ur: NEGATIVE
Nitrite: NEGATIVE
Protein, ur: NEGATIVE
Urobilinogen, UA: 0.2

## 2010-12-22 LAB — PREGNANCY, URINE: Preg Test, Ur: NEGATIVE

## 2010-12-22 LAB — CBC
MCV: 87.1
Platelets: 189
RBC: 4.07
WBC: 6.7

## 2010-12-22 LAB — URINE MICROSCOPIC-ADD ON

## 2011-01-07 ENCOUNTER — Encounter: Payer: Self-pay | Admitting: Family Medicine

## 2011-01-07 ENCOUNTER — Other Ambulatory Visit (HOSPITAL_COMMUNITY)
Admission: RE | Admit: 2011-01-07 | Discharge: 2011-01-07 | Disposition: A | Discharge status: Home / Self Care | Payer: Medicaid Other | Source: Ambulatory Visit | Attending: Family Medicine | Admitting: Family Medicine

## 2011-01-07 ENCOUNTER — Ambulatory Visit (INDEPENDENT_AMBULATORY_CARE_PROVIDER_SITE_OTHER): Payer: Medicaid Other | Admitting: Family Medicine

## 2011-01-07 VITALS — BP 149/98 | HR 111 | Temp 97.7°F | Resp 20 | Ht 63.0 in | Wt 180.1 lb

## 2011-01-07 DIAGNOSIS — Z01812 Encounter for preprocedural laboratory examination: Secondary | ICD-10-CM

## 2011-01-07 DIAGNOSIS — R87619 Unspecified abnormal cytological findings in specimens from cervix uteri: Secondary | ICD-10-CM | POA: Insufficient documentation

## 2011-01-07 DIAGNOSIS — R87810 Cervical high risk human papillomavirus (HPV) DNA test positive: Secondary | ICD-10-CM

## 2011-01-07 DIAGNOSIS — R87610 Atypical squamous cells of undetermined significance on cytologic smear of cervix (ASC-US): Secondary | ICD-10-CM

## 2011-01-07 LAB — POCT PREGNANCY, URINE: Preg Test, Ur: NEGATIVE

## 2011-01-07 NOTE — Progress Notes (Signed)
UPT is negative today. 

## 2011-01-07 NOTE — Progress Notes (Signed)
Colposcopy Procedure Note Patient is a 35-yo [id number] who had her first abnormal pap showing ASCUS, HPV+. She has no history of STI's. She had a BTL and is not on any hormonal medications.  Indications: Pap smear 2 months ago showed: ASCUS with POSITIVE high risk HPV. The prior pap showed no abnormalities.   Procedure Details  The risks and benefits of the procedure and Written informed consent obtained.  Speculum placed in vagina and excellent visualization of cervix achieved, cervix swabbed x 3 with acetic acid solution.  Findings: Cervix: no visible lesions; cervix swabbed with acetic acid and Lugol's solution. No SCJ was seen, cervix was involuted and parous. ECC was performed. INADEQUATE colposcopic examination. Vaginal inspection: vaginal colposcopy not performed. Vulvar colposcopy: vulvar colposcopy not performed.  Specimens: ECC  Complications: none.  Plan: Specimens labelled and sent to Pathology. Will base further treatment on Pathology findings but discussed need for repeat pap in 6 months. Treatment options discussed with patient. Return to discuss Pathology results in 2 weeks or when appointment available.

## 2011-01-29 ENCOUNTER — Ambulatory Visit (INDEPENDENT_AMBULATORY_CARE_PROVIDER_SITE_OTHER): Payer: Medicaid Other | Admitting: Advanced Practice Midwife

## 2011-01-29 ENCOUNTER — Encounter: Payer: Self-pay | Admitting: Advanced Practice Midwife

## 2011-01-29 ENCOUNTER — Other Ambulatory Visit (HOSPITAL_COMMUNITY)
Admission: RE | Admit: 2011-01-29 | Discharge: 2011-01-29 | Disposition: A | Discharge status: Home / Self Care | Payer: Medicaid Other | Source: Ambulatory Visit | Attending: Advanced Practice Midwife | Admitting: Advanced Practice Midwife

## 2011-01-29 VITALS — BP 135/92 | HR 124 | Temp 99.0°F | Wt 174.7 lb

## 2011-01-29 DIAGNOSIS — Z23 Encounter for immunization: Secondary | ICD-10-CM

## 2011-01-29 DIAGNOSIS — R87810 Cervical high risk human papillomavirus (HPV) DNA test positive: Secondary | ICD-10-CM | POA: Insufficient documentation

## 2011-01-29 DIAGNOSIS — Z01419 Encounter for gynecological examination (general) (routine) without abnormal findings: Secondary | ICD-10-CM | POA: Insufficient documentation

## 2011-01-29 DIAGNOSIS — IMO0001 Reserved for inherently not codable concepts without codable children: Secondary | ICD-10-CM | POA: Insufficient documentation

## 2011-01-29 DIAGNOSIS — Z87898 Personal history of other specified conditions: Secondary | ICD-10-CM

## 2011-01-29 DIAGNOSIS — Z8742 Personal history of other diseases of the female genital tract: Secondary | ICD-10-CM

## 2011-01-29 DIAGNOSIS — R6889 Other general symptoms and signs: Secondary | ICD-10-CM

## 2011-01-29 MED ORDER — INFLUENZA VIRUS VACC SPLIT PF IM SUSP
0.5000 mL | Freq: Once | INTRAMUSCULAR | Status: AC
  Administered 2011-01-29: 0.5 mL via INTRAMUSCULAR

## 2011-01-29 NOTE — Patient Instructions (Signed)
Abnormal Pap Test WHAT DOES A PAP TEST CHECK FOR? A Pap test checks the cells in the cervix for any infections or changes that may turn into cancer. The cells are checked to see if they look normal or if they show any changes (abnormal). Abnormal changes may be a sign of problems with your cervix. Cervical cells that are abnormal are called cervical dysplasia. Dysplasia is not cancer. It is a pre-cancerous change in the cells of your cervix. WHAT DOES AN ABNORMAL PAP TEST MEAN? Most times, an abnormal Pap test does not mean you have cancer. However, it does show that there may be a problem. Your doctor will want to do other tests to find out more about the abnormal cells.  Your abnormal Pap test results could show:  Small changes that should be carefully watched.   Dysplasia that could grow into cancer.   Cancer.  When dysplasia is found and treated early, it most often does not grow into cancer. WHAT WILL BE DONE ABOUT MY ABNORMAL PAP TEST? You may have:  A colposcopy test done. Your cevix will be looked at using a strong light and a microscope.   A cone biopsy. A small, cone-shaped sample of your cervix is taken out. The part that is taken out is the area where the abnormal cells are.   Cryosurgery. The abnormal cells on your cervix will be frozen.   Loop Electrical Excision Procedure (LEEP). The abnormal cells will be taken out.  WHAT IF I HAVE A DYSPLASIA OR A CANCER? You and your doctor may choose a hysterectomy as the best treatment for you. During this surgery, your womb (uterus) and your cervix will be taken out. WHAT SHOULD YOU DO AFTER BEING TREATED? Keep having Pap tests and checkups as often as your doctor tells you. Your cervical problem will be carefully watched so it does not get worse. Also, your doctor can watch for, and treat, any new problems that may come up. Document Released: 07/01/2010 Document Revised: 11/27/2010 Document Reviewed: 07/01/2010 [employer] Patient  Information 2012 [employer]. 

## 2011-01-29 NOTE — Progress Notes (Signed)
  Subjective:    Patient ID: [name], female    DOB: Jul 22, 1975, 35 y.o.   MRN: [id number]  HPI: ABBRIELLE BATTS is a 35 y.o. year old [id number] female who presents to OP clinic for review or Colposcopy and Pap result. Colposcopy samples were inadequate due to difficult visualization of the cervix.   Review of Systems: Neg. Normal Menses. Patient's last menstrual period was 01/10/2011.      Objective:   Physical Exam Pelvic exam: normal external genitalia, vulva, vagina, cervix, uterus and adnexa,  VULVA: normal appearing vulva with no masses, tenderness or lesions,  VAGINA: normal appearing vagina with normal color and discharge, no lesions,  CERVIX: normal appearing cervix without discharge or lesions, lesions absent, no discharge noted, cervical motion tenderness absent,  UTERUS: retroverted, mobile,  ADNEXA: normal adnexa in size, nontender and no masses, exam limited by severely anterior cervix,  PAP: Pap smear done today, thin-prep method, recent Pap result reviewed with patient.  Colposcopy 01/07/11: Benign squamous mucosa. No endocervical component present.    Assessment & Plan:  1. Hx ACSUS +HRHPV w/ neg colpo, endocervical component absent  Plan: 1. Pap sent 2. F/U in 6 months for repeat pap if normal today or sooner if indicated.  CHERENE, DOBBINS 01/29/2011 3:46 PM

## 2011-01-29 NOTE — Progress Notes (Signed)
Patient took provera initial 10 days, needs further instruction for further use

## 2011-04-22 ENCOUNTER — Encounter: Payer: Self-pay | Admitting: Physician Assistant

## 2011-04-22 ENCOUNTER — Ambulatory Visit (INDEPENDENT_AMBULATORY_CARE_PROVIDER_SITE_OTHER): Payer: Medicaid Other | Admitting: Physician Assistant

## 2011-04-22 VITALS — BP 143/91 | HR 117 | Temp 98.2°F | Wt 176.5 lb

## 2011-04-22 DIAGNOSIS — N949 Unspecified condition associated with female genital organs and menstrual cycle: Secondary | ICD-10-CM

## 2011-04-22 DIAGNOSIS — R102 Pelvic and perineal pain: Secondary | ICD-10-CM

## 2011-04-22 MED ORDER — DICLOFENAC SODIUM 75 MG PO TBEC: 75.0000 mg | DELAYED_RELEASE_TABLET | Freq: Two times a day (BID) | ORAL | Status: DC

## 2011-04-22 MED ORDER — CYCLOBENZAPRINE HCL 10 MG PO TABS: 10.0000 mg | ORAL_TABLET | Freq: Three times a day (TID) | ORAL | Status: DC | PRN

## 2011-04-22 MED ORDER — NORGESTIMATE-ETH ESTRADIOL 0.25-35 MG-MCG PO TABS: 1.0000 | ORAL_TABLET | Freq: Every day | ORAL | Status: DC

## 2011-04-22 NOTE — Progress Notes (Signed)
States hasn't taken provera- was unsure if she should still be taking it- but states still has painful strong contractions/pain whether bleeds or not

## 2011-04-22 NOTE — Patient Instructions (Addendum)
Back Exercises Back exercises help treat and prevent back injuries. The goal of back exercises is to increase the strength of your abdominal and back muscles and the flexibility of your back. These exercises should be started when you no longer have back pain. Back exercises include:  Pelvic Tilt. Lie on your back with your knees bent. Tilt your pelvis until the lower part of your back is against the floor. Hold this position 5 to 10 sec and repeat 5 to 10 times.   Knee to Chest. Pull first 1 knee up against your chest and hold for 20 to 30 seconds, repeat this with the other knee, and then both knees. This may be done with the other leg straight or bent, whichever feels better.   Sit-Ups or Curl-Ups. Bend your knees 90 degrees. Start with tilting your pelvis, and do a partial, slow sit-up, lifting your trunk only 30 to 45 degrees off the floor. Take at least 2 to 3 seconds for each sit-up. Do not do sit-ups with your knees out straight. If partial sit-ups are difficult, simply do the above but with only tightening your abdominal muscles and holding it as directed.   Hip-Lift. Lie on your back with your knees flexed 90 degrees. Push down with your feet and shoulders as you raise your hips a couple inches off the floor; hold for 10 seconds, repeat 5 to 10 times.   Back arches. Lie on your stomach, propping yourself up on bent elbows. Slowly press on your hands, causing an arch in your low back. Repeat 3 to 5 times. Any initial stiffness and discomfort should lessen with repetition over time.   Shoulder-Lifts. Lie face down with arms beside your body. Keep hips and torso pressed to floor as you slowly lift your head and shoulders off the floor.  Do not overdo your exercises, especially in the beginning. Exercises may cause you some mild back discomfort which lasts for a few minutes; however, if the pain is more severe, or lasts for more than 15 minutes, do not continue exercises until you see your  caregiver. Improvement with exercise therapy for back problems is slow.  See your caregivers for assistance with developing a proper back exercise program. Document Released: 04/23/2004 Document Revised: 11/12/2010 Document Reviewed: 03/16/2005 [facility] Patient Information 2012 [place], [place].Pelvic Pain Pelvic pain is pain below the belly button and located between your hips. Acute pain may last a few hours or days. Chronic pelvic pain may last weeks and months. The cause may be different for different types of pain. The pain may be dull or sharp, mild or severe and can interfere with your daily activities. Write down and tell your caregiver:   Exactly where the pain is located.   If it comes and goes or is there all the time.   When it happens (with sex, urination, bowel movement, etc.)   If the pain is related to your menstrual period or stress.  Your caregiver will take a full history and do a complete physical exam and Pap test. CAUSES   Painful menstrual periods (dysmenorrhea).   Normal ovulation (Mittelschmertz) that occurs in the middle of the menstrual cycle every month.   The pelvic organs get engorged with blood just before the menstrual period (pelvic congestive syndrome).   Scar tissue from an infection or past surgery (pelvic adhesions).   Cancer of the female pelvic organs. When there is pain with cancer, it has been there for a long time.   The  lining of the uterus (endometrium) abnormally grows in places like the pelvis and on the pelvic organs (endometriosis).   A form of endometriosis with the lining of the uterus present inside of the muscle tissue of the uterus (adenomyosis).   Fibroid tumor (noncancerous) in the uterus.   Bladder problems such as infection, bladder spasms of the muscle tissue of the bladder.   Intestinal problems (irritable bowel syndrome, colitis, an ulcer or gastrointestinal infection).   Polyps of the cervix or uterus.   Pregnancy in  the tube (ectopic pregnancy).   The opening of the cervix is too small for the menstrual blood to flow through it (cervical stenosis).   Physical or sexual abuse (past or present).   Musculo-skeletal problems from poor posture, problems with the vertebrae of the lower back or the uterine pelvic muscles falling (prolapse).   Psychological problems such as depression or stress.   IUD (intrauterine device) in the uterus.  DIAGNOSIS  Tests to make a diagnosis depends on the type, location, severity and what causes the pain to occur. Tests that may be needed include:  Blood tests.   Urine tests   Ultrasound.   X-rays.   CT Scan.   MRI.   Laparoscopy.   Major surgery.  TREATMENT  Treatment will depend on the cause of the pain, which includes:  Prescription or over-the-counter pain medication.   Antibiotics.   Birth control pills.   Hormone treatment.   Nerve blocking injections.   Physical therapy.   Antidepressants.   Counseling with a psychiatrist or psychologist.   Minor or major surgery.  HOME CARE INSTRUCTIONS   Only take over-the-counter or prescription medicines for pain, discomfort or fever as directed by your caregiver.   Follow your caregiver's advice to treat your pain.   Rest.   Avoid sexual intercourse if it causes the pain.   Apply warm or cold compresses (which ever works best) to the pain area.   Do relaxation exercises such as yoga or meditation.   Try acupuncture.   Avoid stressful situations.   Try group therapy.   If the pain is because of a stomach/intestinal upset, drink clear liquids, eat a bland light food diet until the symptoms go away.  SEEK MEDICAL CARE IF:   You need stronger prescription pain medication.   You develop pain with sexual intercourse.   You have pain with urination.   You develop a temperature of 102 F (38.9 C) with the pain.   You are still in pain after 4 hours of taking prescription medication  for the pain.   You need depression medication.   Your IUD is causing pain and you want it removed.  SEEK IMMEDIATE MEDICAL CARE IF:  You develop very severe pain or tenderness.   You faint, have chills, severe weakness or dehydration.   You develop heavy vaginal bleeding or passing solid tissue.   You develop a temperature of 102 F (38.9 C) with the pain.   You have blood in the urine.   You are being physically or sexually abused.   You have uncontrolled vomiting and diarrhea.   You are depressed and afraid of harming yourself or someone else.  Document Released: 04/23/2004 Document Revised: 11/26/2010 Document Reviewed: 01/19/2008 [facility] Patient Information 2012 [place], [place].

## 2011-04-22 NOTE — Progress Notes (Signed)
Call placed to [facility] rehab @ [facility] re: referral.  April will review notes, referral order and will call pt directly with appt info.

## 2011-04-22 NOTE — Progress Notes (Signed)
Chief Complaint:  Follow-up   [name] is  36 y.o. [id number].  Patient's last menstrual period was 04/08/2011.. She presents complaining of Follow-up  Onset is described as ongoing and has been present for several years. Pt reports original appt with Dr. Okey Dupre was for pelvic pain and abnormal bleeding and was given provera, which stopped her blding. States that she didn't have another period for 7 months. States pelvic pain is daily and is mildly relieved with NSAIDs. Currently under the care of neurology for back pain, on neurontin, med was increased this month.  Obstetrical/Gynecological History: Pertinent Gynecological History: Menses: flow is excessive with use of 9 pads or tampons on heaviest days Bleeding: irregular Contraception: tubal ligation DES exposure: unknown Blood transfusions: none Sexually transmitted diseases: no past history Previous GYN Procedures: colpo for ASCUS  Last Pap: abnormal: ASCUS Date: 01/2011  Past Medical History: Past Medical History  Diagnosis Date  . Diabetes mellitus     19 years ago  . Muscle spasms of lower extremity     legs and weakness  . Hypercholesteremia     Past Surgical History: Past Surgical History  Procedure Date  . Tubal ligation 2002  . Dilation and curettage of uterus 1997  . Wisdom tooth extraction     age 84  . Cesarean section 1993, 1998, 2002    Family History: Family History  Problem Relation Age of Onset  . Diabetes Mother   . Parkinsonism Mother   . Diabetes Father   . Hypertension Father   . Diabetes Brother     Social History: History  Substance Use Topics  . Smoking status: Current Everyday Smoker -- 1.0 packs/day for 6 years    Types: Cigarettes  . Smokeless tobacco: Never Used  . Alcohol Use: 0.6 - 1.2 oz/week    1-2 Glasses of wine per week     1-2 a year    Allergies:  Allergies  Allergen Reactions  . Demerol     Rash, itching  . Morphine And Related     Rash, itching   . Zocor  (Simvastatin) Itching     Review of Systems - History obtained from the patient General ROS: negative for - fatigue, fever, hot flashes, malaise, night sweats, sleep disturbance, weight gain or weight loss ENT ROS: negative Respiratory ROS: no cough, shortness of breath, or wheezing Cardiovascular ROS: no chest pain or dyspnea on exertion Gastrointestinal ROS: positive for - abd cramping Genito-Urinary ROS: no dysuria, trouble voiding, or hematuria positive for - irregular periods Musculoskeletal ROS: positive for - muscle pain and pain in buttock - bilateral Neurological ROS: no TIA or stroke symptoms   Physical Exam   Blood pressure 143/91, pulse 117, temperature 98.2 F (36.8 C), temperature source Oral, weight 176 lb 8 oz (80.06 kg), last menstrual period 04/08/2011. General: General appearance - alert, well appearing, and in no distress, oriented to person, place, and time and overweight Mental status - alert, oriented to person, place, and time, normal mood, behavior, speech, dress, motor activity, and thought processes, affect appropriate to mood Abdomen - soft, nontender, nondistended, no masses or organomegaly no CVA tenderness Focused Gynecological Exam: CERVIX: cervical motion tenderness absent, UTERUS: uterus is normal size, shape, consistency and nontender, retroflexed, ADNEXA: normal adnexa in size, nontender and no masses  Assessment: Chronic pelvic pain Patient Active Problem List  Diagnoses  . Abnormal HPV Pap smear, poor sample   Plan: Pain likely musculoskeletal Referred to [facility] for pelvic floor  exercises; handout also given to patient Return for evaluation in 8 weeks  [name] E. 04/22/2011,2:55 PM

## 2011-05-01 ENCOUNTER — Ambulatory Visit: Payer: Medicaid Other | Attending: Obstetrics & Gynecology | Admitting: Physical Therapy

## 2011-05-01 DIAGNOSIS — IMO0001 Reserved for inherently not codable concepts without codable children: Secondary | ICD-10-CM | POA: Insufficient documentation

## 2011-05-01 DIAGNOSIS — M629 Disorder of muscle, unspecified: Secondary | ICD-10-CM | POA: Insufficient documentation

## 2011-05-01 DIAGNOSIS — M2420 Disorder of ligament, unspecified site: Secondary | ICD-10-CM | POA: Insufficient documentation

## 2011-05-07 ENCOUNTER — Inpatient Hospital Stay (HOSPITAL_COMMUNITY): Payer: Medicaid Other

## 2011-05-07 ENCOUNTER — Inpatient Hospital Stay (HOSPITAL_COMMUNITY)
Admission: AD | Admit: 2011-05-07 | Discharge: 2011-05-08 | Disposition: A | Discharge status: Home / Self Care | Payer: Medicaid Other | Source: Ambulatory Visit | Attending: Obstetrics & Gynecology | Admitting: Obstetrics & Gynecology

## 2011-05-07 ENCOUNTER — Encounter (HOSPITAL_COMMUNITY): Payer: Self-pay | Admitting: *Deleted

## 2011-05-07 DIAGNOSIS — R109 Unspecified abdominal pain: Secondary | ICD-10-CM | POA: Insufficient documentation

## 2011-05-07 DIAGNOSIS — R102 Pelvic and perineal pain: Secondary | ICD-10-CM

## 2011-05-07 DIAGNOSIS — N946 Dysmenorrhea, unspecified: Secondary | ICD-10-CM | POA: Insufficient documentation

## 2011-05-07 DIAGNOSIS — N949 Unspecified condition associated with female genital organs and menstrual cycle: Secondary | ICD-10-CM | POA: Insufficient documentation

## 2011-05-07 LAB — URINALYSIS, ROUTINE W REFLEX MICROSCOPIC
Glucose, UA: NEGATIVE mg/dL
Leukocytes, UA: NEGATIVE
Protein, ur: NEGATIVE mg/dL
Specific Gravity, Urine: <1.005 — ABNORMAL LOW (ref 1.005–1.030)
pH: 6 (ref 5.0–8.0)

## 2011-05-07 LAB — WET PREP, GENITAL: Trich, Wet Prep: NONE SEEN

## 2011-05-07 LAB — URINE MICROSCOPIC-ADD ON

## 2011-05-07 MED ORDER — KETOROLAC TROMETHAMINE 60 MG/2ML IM SOLN
60.0000 mg | Freq: Once | INTRAMUSCULAR | Status: AC
  Administered 2011-05-07: 60 mg via INTRAMUSCULAR
  Filled 2011-05-07: qty 2

## 2011-05-07 NOTE — ED Provider Notes (Signed)
History     Chief Complaint  Patient presents with  . Abdominal Pain   HPI 36 y.o. [id number] with low abd and bilateral upper leg pain. LMP on Tuesday, heavy bleeding intermittently. H/O chronic pelvic pain and painful periods, started OCPs 2 weeks ago for heavy bleeding. Took Diclofenac, Flexeril and Aleve earlier tonight, which she takes on a regular basis for chronic pain.    Past Medical History  Diagnosis Date  . Diabetes mellitus     19 years ago  . Muscle spasms of lower extremity     legs and weakness  . Hypercholesteremia     Past Surgical History  Procedure Date  . Tubal ligation 2002  . Dilation and curettage of uterus 1997  . Wisdom tooth extraction     age 43  . Cesarean section 1993, 1998, 2002    Family History  Problem Relation Age of Onset  . Diabetes Mother   . Parkinsonism Mother   . Diabetes Father   . Hypertension Father   . Diabetes Brother     History  Substance Use Topics  . Smoking status: Current Everyday Smoker -- 1.0 packs/day for 6 years    Types: Cigarettes  . Smokeless tobacco: Never Used  . Alcohol Use: 0.6 - 1.2 oz/week    1-2 Glasses of wine per week     1-2 a year    Allergies:  Allergies  Allergen Reactions  . Demerol Itching and Rash  . Morphine And Related Itching and Rash       . Zocor (Simvastatin) Itching    Prescriptions prior to admission  Medication Sig Dispense Refill  . acetaminophen (TYLENOL) 500 MG tablet Take 500 mg by mouth as needed.       Marland Kitchen azithromycin (ZITHROMAX) 250 MG tablet Take 250 mg by mouth daily.        . CHLORPHENIRAMINE MALEATE PO Take 1 tablet by mouth 2 (two) times daily as needed.        . cyclobenzaprine (FLEXERIL) 10 MG tablet Take 1 tablet (10 mg total) by mouth every 8 (eight) hours as needed for muscle spasms.  30 tablet  3  . diclofenac (VOLTAREN) 75 MG EC tablet Take 1 tablet (75 mg total) by mouth 2 (two) times daily with a meal.  60 tablet  2  . gabapentin (NEURONTIN) 300 MG  capsule Take 300 mg by mouth 3 (three) times daily.       Marland Kitchen ibuprofen (ADVIL,MOTRIN) 200 MG tablet Take 200 mg by mouth every 6 (six) hours as needed.      . insulin glargine (LANTUS) 100 UNIT/ML injection Inject 30 Units into the skin every morning.        . medroxyPROGESTERone (PROVERA) 10 MG tablet Take 1 tablet (10 mg total) by mouth daily.  30 tablet  4  . metFORMIN (GLUCOPHAGE) 500 MG tablet Take 500 mg by mouth 2 (two) times daily.        . Multiple Vitamin (MULTIVITAMIN) tablet Take 1 tablet by mouth daily.        . norgestimate-ethinyl estradiol (SPRINTEC 28) 0.25-35 MG-MCG tablet Take 1 tablet by mouth daily.  1 Package  11    Review of Systems  Constitutional: Negative.   Respiratory: Negative.   Cardiovascular: Negative.   Gastrointestinal: Positive for abdominal pain. Negative for nausea, vomiting, diarrhea and constipation.  Genitourinary: Negative for dysuria, urgency, frequency, hematuria and flank pain.       Positive for bleeding   Musculoskeletal:  Negative.   Neurological: Negative.   Psychiatric/Behavioral: Negative.    Physical Exam   Blood pressure 141/86, pulse 113, temperature 99.4 F (37.4 C), resp. rate 18, height 5\' 3"  (1.6 m), weight 179 lb (81.194 kg), last menstrual period 05/04/2011, SpO2 99.00%.  Physical Exam  Constitutional: She is oriented to person, place, and time. She appears well-developed and well-nourished. No distress.  HENT:  Head: Normocephalic and atraumatic.  Cardiovascular: Normal rate, regular rhythm and normal heart sounds.   Respiratory: Effort normal and breath sounds normal. No respiratory distress.  GI: Soft. Bowel sounds are normal. She exhibits no distension and no mass. There is no tenderness. There is no rebound and no guarding.  Genitourinary: There is no rash or lesion on the right labia. There is no rash or lesion on the left labia. Uterus is tender. Uterus is not deviated, not enlarged and not fixed. Cervix exhibits no  motion tenderness, no discharge and no friability. Right adnexum displays tenderness. Right adnexum displays no mass and no fullness. Left adnexum displays tenderness. Left adnexum displays no mass and no fullness. There is bleeding (small) around the vagina. No erythema or tenderness around the vagina. No vaginal discharge found.  Neurological: She is alert and oriented to person, place, and time.  Skin: Skin is warm and dry.  Psychiatric: She has a normal mood and affect.    MAU Course  Procedures Results for orders placed during the hospital encounter of 05/07/11 (from the past 24 hour(s))  URINALYSIS, ROUTINE W REFLEX MICROSCOPIC     Status: Abnormal   Collection Time   05/07/11  9:30 PM      Component Value Range   Color, Urine YELLOW  YELLOW    APPearance CLEAR  CLEAR    Specific Gravity, Urine <1.005 (*) 1.005 - 1.030    pH 6.0  5.0 - 8.0    Glucose, UA NEGATIVE  NEGATIVE (mg/dL)   Hgb urine dipstick LARGE (*) NEGATIVE    Bilirubin Urine NEGATIVE  NEGATIVE    Ketones, ur NEGATIVE  NEGATIVE (mg/dL)   Protein, ur NEGATIVE  NEGATIVE (mg/dL)   Urobilinogen, UA 1.0  0.0 - 1.0 (mg/dL)   Nitrite NEGATIVE  NEGATIVE    Leukocytes, UA NEGATIVE  NEGATIVE   URINE MICROSCOPIC-ADD ON     Status: Normal   Collection Time   05/07/11  9:30 PM      Component Value Range   Squamous Epithelial / LPF RARE  RARE    WBC, UA 0-2  <3 (WBC/hpf)  WET PREP, GENITAL     Status: Abnormal   Collection Time   05/07/11 10:40 PM      Component Value Range   Yeast Wet Prep HPF POC NONE SEEN  NONE SEEN    Trich, Wet Prep NONE SEEN  NONE SEEN    Clue Cells Wet Prep HPF POC NONE SEEN  NONE SEEN    WBC, Wet Prep HPF POC FEW (*) NONE SEEN      Assessment and Plan  36 y.o. [id number] with chronic pelvic pain/dysmenorrhea F/U in clinic PRN  FRAZIER,NATALIE 05/07/2011, 10:35 PM

## 2011-05-07 NOTE — Progress Notes (Signed)
Pt reports abd pain for several days, worsening today. Started bcp's for heavy bleeding about 2 weeks ago, started period on Tuesday and had heavy bleeding yesterday but stopped today. Also today started having pain in bilateral upper legs

## 2011-05-07 NOTE — Progress Notes (Signed)
SAYS  HAS BEEN GOING TO GYN CLINIC SINCE LAST YEAR- HAD ABNL PAP SMEAR- BX- THEN SENT TO PHYSICAL THERAPIST FOR LOWER BACK PAIN- HAS WEAK MUSCLES-   AT [facility]-   HAS NOT  GONE YET-  APPOINTMENT   IS 05-15-2011-  PT TAKES  MEDS- MUSCLE RELAX AND PAIN MED AND BCP- TAKES BCP REG.-  IS IN 2 ND OF PACK- SHE STARTED  BLEEDING- BRIGHT RED ON Tuesday-  THEN STOPPED  WED NIGHT .  NO BLEEDING NOW.   TODAY AT 4 PM STARTED HAVING PAIN IN LEGS AND THIGHS-   TOOK PAIN AND RELAXANT MED TODAY   AFTER 4 PM.

## 2011-05-08 LAB — GC/CHLAMYDIA PROBE AMP, GENITAL
Chlamydia, DNA Probe: NEGATIVE
GC Probe Amp, Genital: NEGATIVE

## 2011-05-11 NOTE — ED Provider Notes (Signed)
Attestation of Attending Supervision of Advanced Practitioner: Evaluation and management procedures were performed by the PA/NP/CNM/OB Fellow under my supervision/collaboration. Chart reviewed, and agree with management and plan.  Jaynie Collins, M.D. 05/11/2011 1:44 PM

## 2011-05-15 ENCOUNTER — Ambulatory Visit: Payer: Medicaid Other | Admitting: Physical Therapy

## 2011-05-29 ENCOUNTER — Ambulatory Visit: Payer: Medicaid Other | Attending: Obstetrics & Gynecology | Admitting: Physical Therapy

## 2011-05-29 DIAGNOSIS — IMO0001 Reserved for inherently not codable concepts without codable children: Secondary | ICD-10-CM | POA: Insufficient documentation

## 2011-05-29 DIAGNOSIS — M629 Disorder of muscle, unspecified: Secondary | ICD-10-CM | POA: Insufficient documentation

## 2011-05-29 DIAGNOSIS — M2420 Disorder of ligament, unspecified site: Secondary | ICD-10-CM | POA: Insufficient documentation

## 2011-06-11 ENCOUNTER — Ambulatory Visit: Payer: Medicaid Other | Admitting: Physical Therapy

## 2011-07-30 ENCOUNTER — Other Ambulatory Visit: Payer: Self-pay | Admitting: Physician Assistant

## 2011-07-30 ENCOUNTER — Ambulatory Visit: Payer: Medicaid Other | Admitting: Obstetrics & Gynecology

## 2011-08-12 ENCOUNTER — Telehealth: Payer: Self-pay | Admitting: *Deleted

## 2011-08-12 NOTE — Telephone Encounter (Signed)
Message left by [facility] with refill request by pt for cyclobenzaprene.  Call routed to [facility] CNM for disposition.

## 2011-08-12 NOTE — Telephone Encounter (Signed)
Received a call from [facility] on [address]- requesting refill for flexeril and diclofenac.   Per chart review [place] has pending refills for both from 07/30/11- called [facility] and notified will send to [facility] for approval or denial and they should receive via eprescribe.

## 2011-08-13 NOTE — Telephone Encounter (Signed)
She may have 1 refill on both, needs FU appt in clinic.

## 2011-08-14 NOTE — Telephone Encounter (Signed)
Called prescription in to [facility]  For one refill for Diclofenac, they state they have received the refill on the flexeril, also notified pharmacy patient will need to be seen for any further refills. Called patient and unable to leave a message.

## 2011-09-06 ENCOUNTER — Other Ambulatory Visit: Payer: Self-pay | Admitting: Physician Assistant

## 2011-11-21 ENCOUNTER — Emergency Department (HOSPITAL_COMMUNITY)
Admission: EM | Admit: 2011-11-21 | Discharge: 2011-11-21 | Disposition: A | Discharge status: Home / Self Care | Payer: Medicaid Other | Attending: Emergency Medicine | Admitting: Emergency Medicine

## 2011-11-21 ENCOUNTER — Encounter (HOSPITAL_COMMUNITY): Payer: Self-pay | Admitting: Family Medicine

## 2011-11-21 DIAGNOSIS — E7800 Pure hypercholesterolemia, unspecified: Secondary | ICD-10-CM | POA: Insufficient documentation

## 2011-11-21 DIAGNOSIS — T50905A Adverse effect of unspecified drugs, medicaments and biological substances, initial encounter: Secondary | ICD-10-CM

## 2011-11-21 DIAGNOSIS — E1169 Type 2 diabetes mellitus with other specified complication: Secondary | ICD-10-CM | POA: Insufficient documentation

## 2011-11-21 DIAGNOSIS — R0602 Shortness of breath: Secondary | ICD-10-CM | POA: Insufficient documentation

## 2011-11-21 DIAGNOSIS — Z794 Long term (current) use of insulin: Secondary | ICD-10-CM | POA: Insufficient documentation

## 2011-11-21 DIAGNOSIS — R739 Hyperglycemia, unspecified: Secondary | ICD-10-CM

## 2011-11-21 DIAGNOSIS — F17200 Nicotine dependence, unspecified, uncomplicated: Secondary | ICD-10-CM | POA: Insufficient documentation

## 2011-11-21 DIAGNOSIS — Z79899 Other long term (current) drug therapy: Secondary | ICD-10-CM | POA: Insufficient documentation

## 2011-11-21 LAB — COMPREHENSIVE METABOLIC PANEL
ALT: 16 U/L (ref 0–35)
AST: 12 U/L (ref 0–37)
Albumin: 3.9 g/dL (ref 3.5–5.2)
Calcium: 10.3 mg/dL (ref 8.4–10.5)
Creatinine, Ser: 0.73 mg/dL (ref 0.50–1.10)
Sodium: 137 mEq/L (ref 135–145)
Total Protein: 7.9 g/dL (ref 6.0–8.3)

## 2011-11-21 LAB — CBC WITH DIFFERENTIAL/PLATELET
Basophils Absolute: 0 10*3/uL (ref 0.0–0.1)
Basophils Relative: 0 % (ref 0–1)
Eosinophils Absolute: 0 10*3/uL (ref 0.0–0.7)
Eosinophils Relative: 0 % (ref 0–5)
Lymphocytes Relative: 11 % — ABNORMAL LOW (ref 12–46)
MCH: 30.7 pg (ref 26.0–34.0)
MCHC: 35.3 g/dL (ref 30.0–36.0)
MCV: 87.1 fL (ref 78.0–100.0)
Monocytes Absolute: 0.2 10*3/uL (ref 0.1–1.0)
Platelets: 265 10*3/uL (ref 150–400)
RDW: 11.7 % (ref 11.5–15.5)
WBC: 14.3 10*3/uL — ABNORMAL HIGH (ref 4.0–10.5)

## 2011-11-21 MED ORDER — ALBUTEROL SULFATE (5 MG/ML) 0.5% IN NEBU
2.5000 mg | INHALATION_SOLUTION | RESPIRATORY_TRACT | Status: DC
  Administered 2011-11-21: 2.5 mg via RESPIRATORY_TRACT
  Filled 2011-11-21: qty 0.5

## 2011-11-21 MED ORDER — SODIUM CHLORIDE 0.9 % IV BOLUS (SEPSIS)
1000.0000 mL | Freq: Once | INTRAVENOUS | Status: AC
  Administered 2011-11-21: 1000 mL via INTRAVENOUS

## 2011-11-21 MED ORDER — IPRATROPIUM BROMIDE 0.02 % IN SOLN
0.5000 mg | RESPIRATORY_TRACT | Status: DC
  Administered 2011-11-21: 0.5 mg via RESPIRATORY_TRACT
  Filled 2011-11-21: qty 2.5

## 2011-11-21 NOTE — ED Notes (Signed)
Per pt was started on steroids yesterdasy and since her blood sugars have been elevated. Was instructed by her doctor to increase insulin last night.

## 2011-11-21 NOTE — ED Notes (Signed)
CBG- 264 

## 2011-11-21 NOTE — ED Provider Notes (Signed)
History     CSN: [id number]  Arrival date & time 11/21/11  1522   First MD Initiated Contact with Patient 11/21/11 2028      Chief Complaint  Patient presents with  . Hyperglycemia    (Consider location/radiation/quality/duration/timing/severity/associated sxs/prior treatment) HPI [name] is a 36 y.o. female presenting to the emergency department because she was put on steroids for an asthma exacerbation by her primary care physician and she is concerned because her blood sugars have been elevated. Last night she increased her insulin last night according to her primary care physician's instructions. Today she still feels mildly short of breath, as conservative elevated blood sugars. She does endorse some mild polydipsia and polyuria. She's having no chest pain, no productive cough, no fevers and no chills. No diaphoresis.  Patient has no history of DVT or PE, she is not immobilized, denies trauma or cancer treatment, she denies any hemoptysis. She is taking Sprintec which is an oral contraceptive.    Past Medical History  Diagnosis Date  . Diabetes mellitus     19 years ago  . Muscle spasms of lower extremity     legs and weakness  . Hypercholesteremia     Past Surgical History  Procedure Date  . Tubal ligation 2002  . Dilation and curettage of uterus 1997  . Wisdom tooth extraction     age 46  . Cesarean section 1993, 1998, 2002    Family History  Problem Relation Age of Onset  . Diabetes Mother   . Parkinsonism Mother   . Diabetes Father   . Hypertension Father   . Diabetes Brother     History  Substance Use Topics  . Smoking status: Current Everyday Smoker -- 1.0 packs/day for 6 years    Types: Cigarettes  . Smokeless tobacco: Never Used  . Alcohol Use: 0.6 - 1.2 oz/week    1-2 Glasses of wine per week     1-2 a year    OB History    Grav Para Term Preterm Abortions TAB SAB Ect Mult Living   4 3 2 1 1     3       Review of  Systems CONSTITUTIONAL: No fever, chills or systemic signs of infection HEENT: No facial pain, sinus congestion, rhinorrhea, no acute visual loss  NECK: No swelling or masses are reported PULMONARY:  positive for cough, no sputum production,  denies hemoptysis.  she has been short of breath  intermittently which resolves with albuterol. CARDIAC: No palpitations, chest pain or pressure. ABDOMINAL: The patient is denying any abdominal pain, nausea, vomiting or diarrhea. No melena or hemotochezia. GENITOURINARY: No burning with urination or frequency. EXTREMITIES:  no arthralgias or myalgias  NEUROLOGIC: Denying any focal or lateralizing neurologic impairments. ENDOCRINE:Endorses mild polyuria and polydipsia  Allergies  Demerol; Morphine and related; and Zocor  Home Medications   Current Outpatient Rx  Name Route Sig Dispense Refill  . GABAPENTIN 300 MG PO CAPS Oral Take 300 mg by mouth 2 (two) times daily.     . INSULIN GLARGINE 100 UNIT/ML [place] SOLN Subcutaneous Inject 36 Units into the skin every morning.     Marland Kitchen METFORMIN HCL 500 MG PO TABS Oral Take 500 mg by mouth 2 (two) times daily.      Marland Kitchen ONE-DAILY MULTI VITAMINS PO TABS Oral Take 1 tablet by mouth daily.      Marland Kitchen NORGESTIMATE-ETH ESTRADIOL 0.25-35 MG-MCG PO TABS Oral Take 1 tablet by mouth daily. 1 Package 11  .  PRAVASTATIN SODIUM 20 MG PO TABS Oral Take 20 mg by mouth daily.      BP 178/86  Pulse 97  Temp 98.3 F (36.8 C) (Oral)  Resp 18  SpO2 98%  LMP 10/27/2011  Physical Exam VITAL SIGNS:  Per chart CONSTITUTIONAL: Awake, oriented, appears non-toxic HENT: Atraumatic, normocephalic, oral mucosa pink and moist, airway patent, no edema or erythema EYES: Conjunctiva clear, PERRL, EOMI NECK: Supple, non-tender, no masses, trachea midline CARDIOVASCULAR: Normal heart rate, Normal rhythm, No murmurs, No rubs, No gallops PULMONARY/CHEST: Clear to auscultation, no rhonchi, or rales. Very mild expiratory wheeze. Symmetrical  breath sounds. Non-tender. ABDOMINAL: Non-distended, normal bowel sounds, soft, non-tender. EXTREMITIES: No clubbing, cyanosis, or edema SKIN: Warm, Dry, No erythema, No rash  , Lamictal as a ED Course  Procedures (including critical care time)  Labs Reviewed  GLUCOSE, CAPILLARY - Abnormal; Notable for the following:    Glucose-Capillary 318 (*)     All other components within normal limits  CBC WITH DIFFERENTIAL - Abnormal; Notable for the following:    WBC 14.3 (*)     Neutrophils Relative 88 (*)     Neutro Abs 12.6 (*)     Lymphocytes Relative 11 (*)     Monocytes Relative 1 (*)     All other components within normal limits  COMPREHENSIVE METABOLIC PANEL - Abnormal; Notable for the following:    Glucose, Bld 325 (*)     Total Bilirubin 0.2 (*)     All other components within normal limits  GLUCOSE, CAPILLARY - Abnormal; Notable for the following:    Glucose-Capillary 261 (*)     All other components within normal limits   No results found.   1. Hyperglycemia, drug-induced   2. Shortness of breath       MDM  [name] is a 36 y.o. female presenting with hyperglycemia due to the fact that she is also taking prednisone for an asthma exacerbation. She is mildly dehydrated and slightly tachycardic to 118, this resolved upon normal saline fluid bolus and on my exit interview, the patient's heart rate was 89 beats per minute.   Do not think the patient has a PE. Patient Wells Score is 1 for tachycardia, PE is not the #1 diagnosis on my differential, mild dehydration due to hyperglycemia is. Which places her in a low-risk group for PE, I do not think any further testing is needed at this time for PE.   Low risk group 0-1: 1.3% chance of PE in an ED population. Alternative study had scores ? 4 as 'PE Unlikely' and had a 3% incidence of PE.    We discussed keeping her on the prednisone for one more day and using her inhaler more frequently and she felt like she needed it,  she is not wheezing on my repeat exam at all, and she was minimally wheezing on my first exam, I think  Her exacerbatio over at this point.  Patient's oxygen saturation has been greater than 96% into 3x I visited her in her room. She is in no respiratory distress, and she can be followed up as an outpatient for her asthma and her diabetes.  I told her to keep her primary care physician's advice adjusting her insulin. I will not give her any insulin at this time  as I do not think it is indicated to treat an elevated number, with her being asymptomatic otherwise. I explained the diagnosis in detail and have given standard ER return precautions  including chest pain, shortness of breath etc, and precautions specific to the patients diagnosis. The patient understands and accepts the medical plan as it's been dictated and I have answered all questions. Discharge instructions concerning home care and prescriptions have been given.  The patient is STABLE and is discharged to home in good condition.     Jones Skene, MD 11/22/11 [phone]

## 2011-11-21 NOTE — ED Notes (Signed)
Pt was started on antibiotics 9 days ago for respiratory infection.  Went to doctor yesterday due not feeling any better after finishing antibiotics.  Was started on low dose steroid due to hx of DM.  Blood sugars have increased got up to 450 today, was instructed by on call nurse to come to ED for eval.  Pt not in distress, a&O. Will continue to monitor.

## 2012-06-16 ENCOUNTER — Telehealth: Payer: Self-pay | Admitting: Family Medicine

## 2012-06-16 MED ORDER — VARENICLINE TARTRATE 1 MG PO TABS: 1.0000 mg | ORAL_TABLET | Freq: Two times a day (BID) | ORAL | Status: DC

## 2012-06-16 NOTE — Telephone Encounter (Signed)
Kim, pease call out Chantix continuing month pack and 1 refill

## 2012-06-16 NOTE — Telephone Encounter (Signed)
Patient called left mess that refill approved of Chantix and has been sent to Pharmacy.

## 2012-06-16 NOTE — Telephone Encounter (Signed)
Ok to refill a "continuing dose pack" for 1 month

## 2012-06-16 NOTE — Telephone Encounter (Signed)
Patient calling for refill of Chantix and was told to call and report BP readings first. BP is 110/90 today.

## 2012-07-27 ENCOUNTER — Other Ambulatory Visit: Payer: Self-pay | Admitting: Family Medicine

## 2012-08-06 IMAGING — US US TRANSVAGINAL NON-OB
1 series · 14 of 25 positions shown · non-contrast
Comparison: None.

CLINICAL DATA: Pelvic pain with dysfunctional uterine bleeding.
LMP 08/29/2010

TRANSABDOMINAL AND TRANSVAGINAL ULTRASOUND OF PELVIS
TECHNIQUE: Both transabdominal and transvaginal ultrasound
examinations of the pelvis were performed. Transabdominal technique
was performed for global imaging of the pelvis including uterus,
ovaries, adnexal regions, and pelvic cul-de-sac.

[Series 1: us pelvis complete · 14 of 61 slices shown]
[im 1/61]
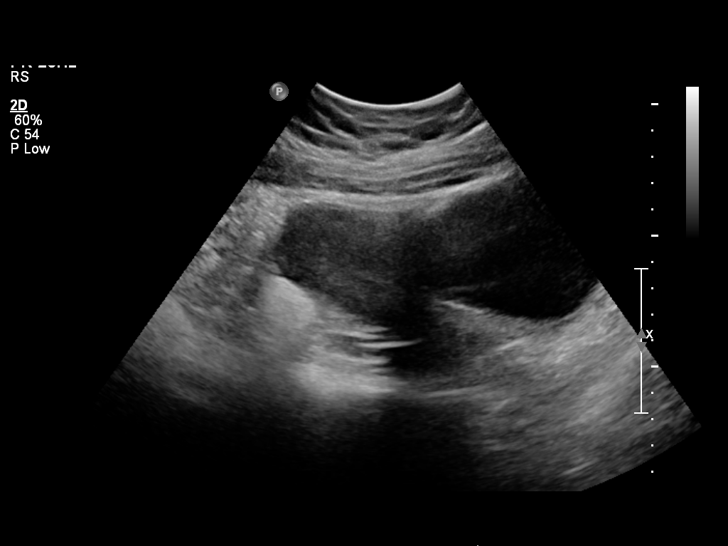
[im 6/61]
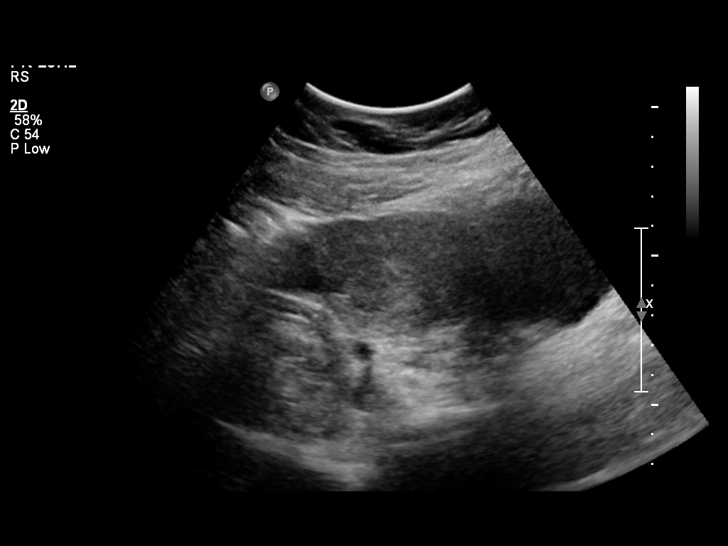
[im 11/61]
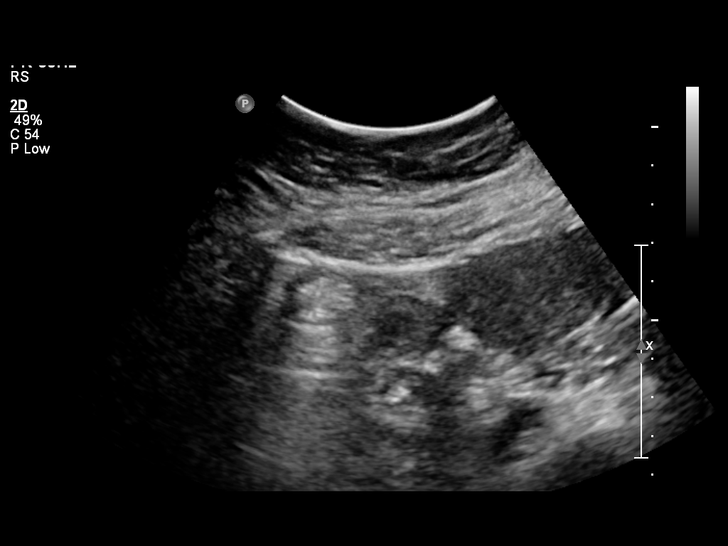
[im 16/61]
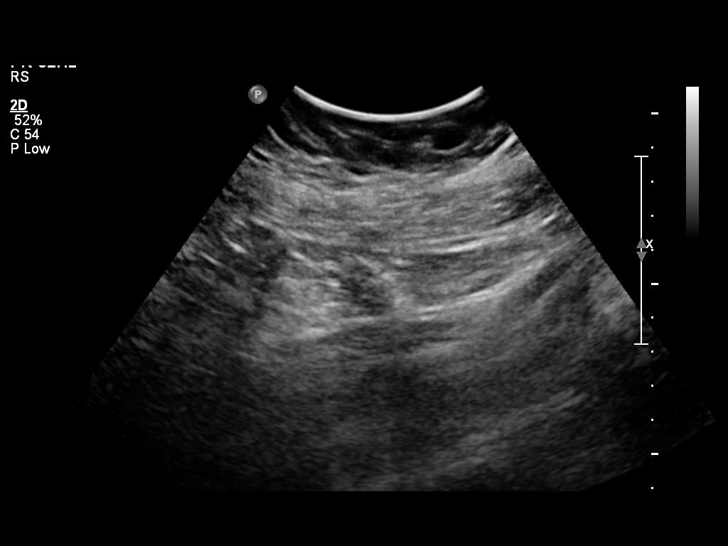
[im 21/61]
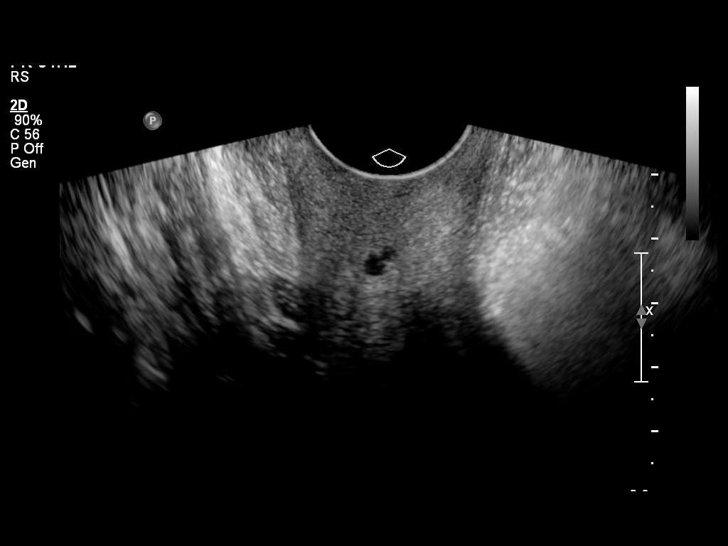
[im 23/61]
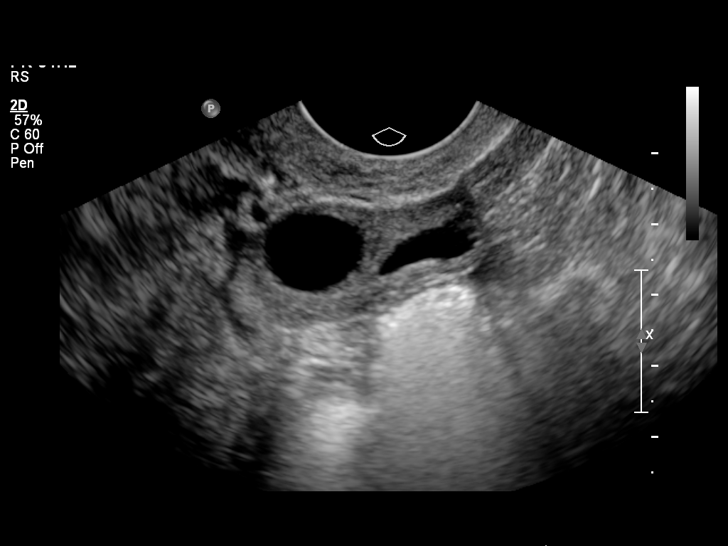
[im 28/61]
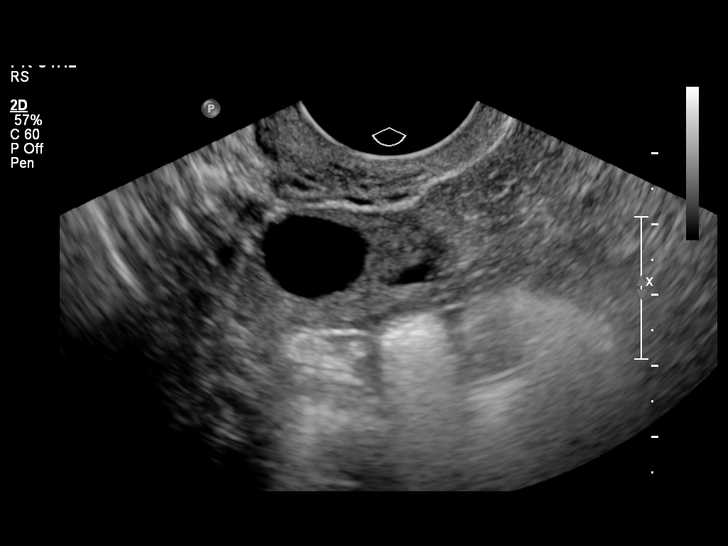
[im 33/61]
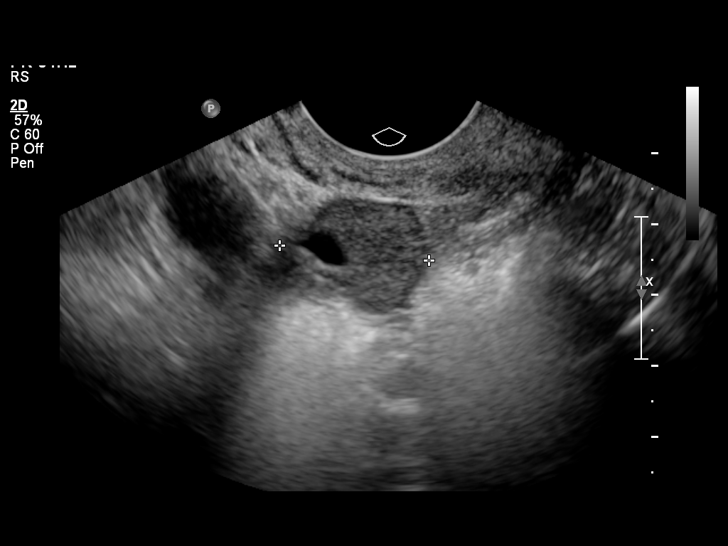
[im 38/61]
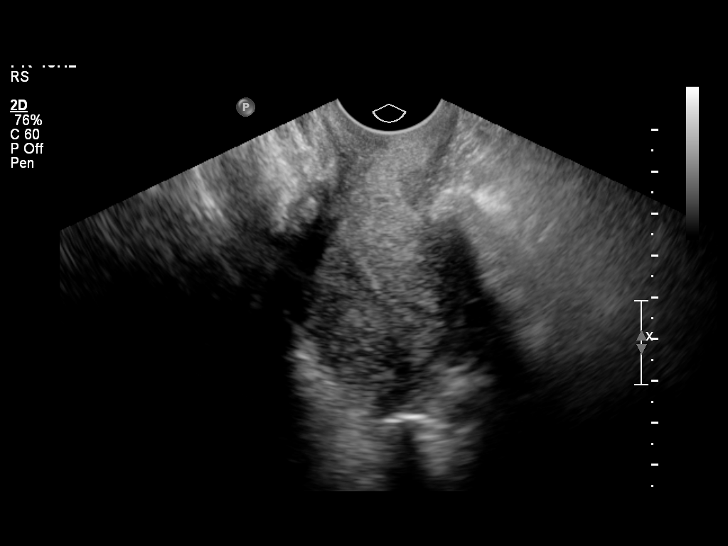
[im 41/61]
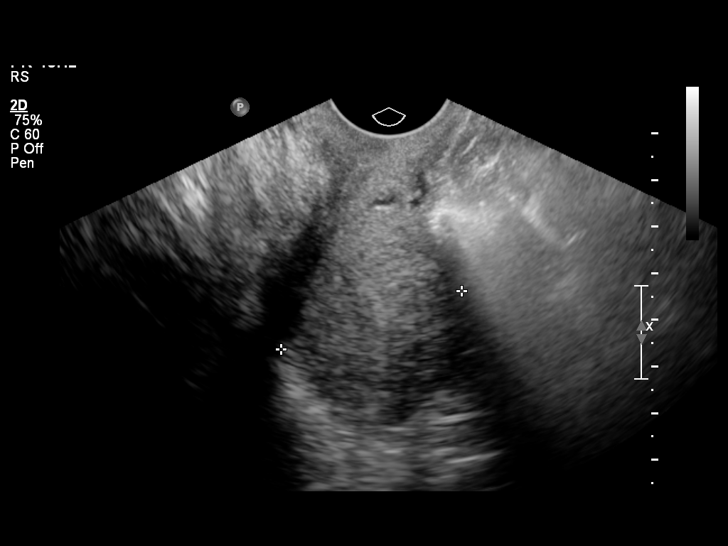
[im 46/61]
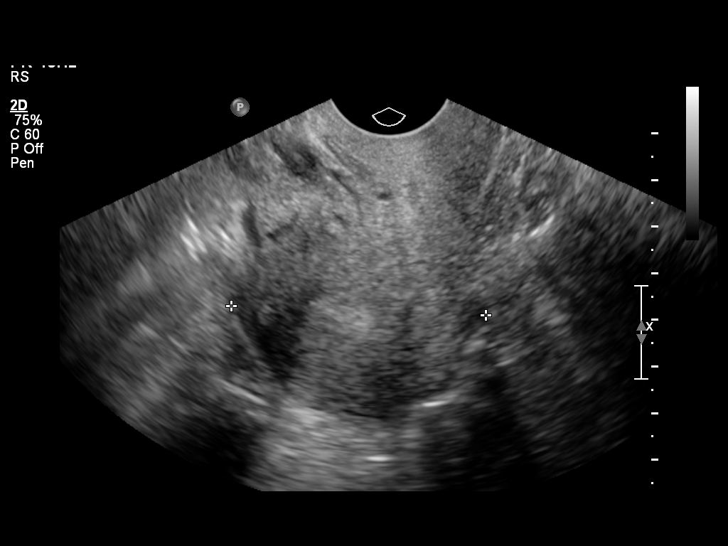
[im 51/61]
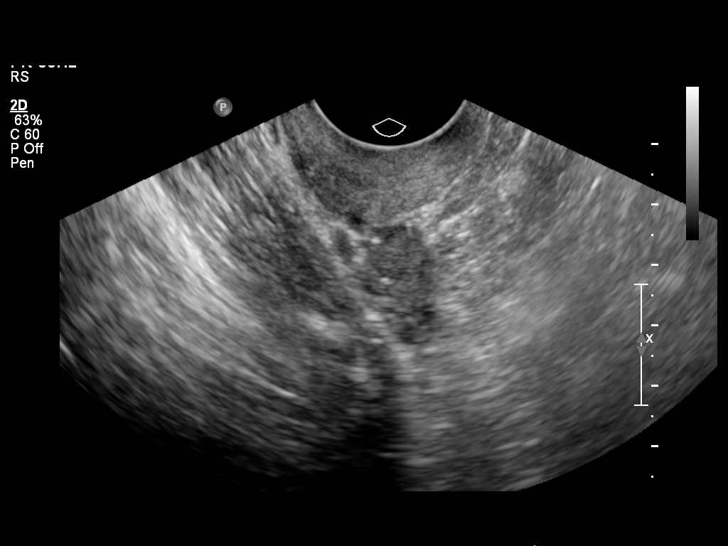
[im 56/61]
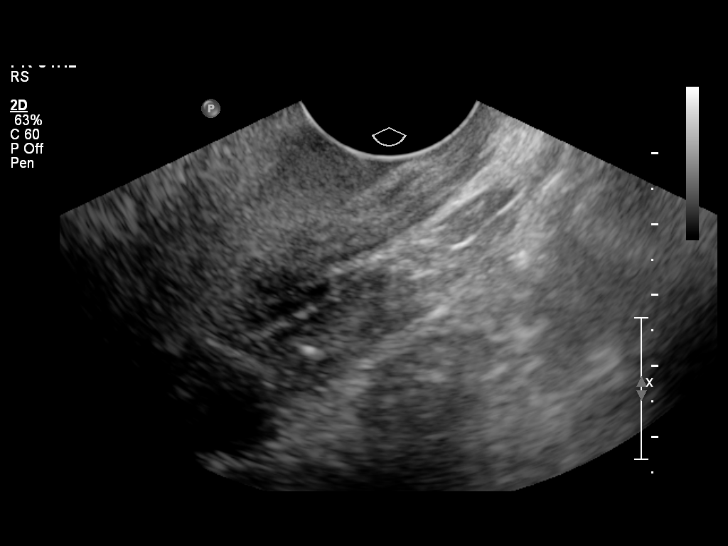
[im 61/61]
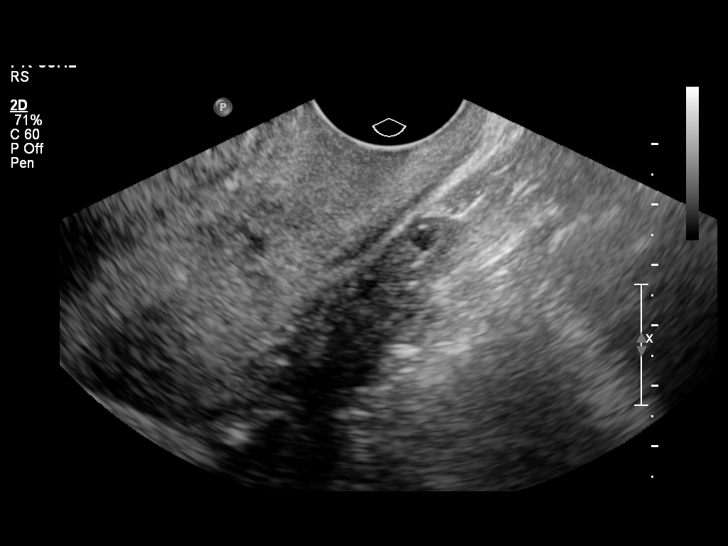

[14 of 25 positions shown; findings below may reference images not displayed]

It was necessary to proceed with endovaginal exam following the
transabdomnial exam to visualize the endometrium and ovaries.
FINDINGS: Uterus: The uterus is retroflexed and has transverse positioning on
endovaginal exam.  Demonstrates a length of 7.2 cm, AP depth of
cm and a transverse width of 5.5 cm.  A homogeneous uterine
myometrium is noted

Endometrium: Is thin and echogenic with an AP width of 4 mm.  No
areas of focal thickening or heterogeneity are noted

Right ovary:  Measures 3.7 x 1.8 by 2.1 cm and contains a dominant
follicle

Left ovary: Has a normal appearance measuring 2.8 x 1.4 x 2.1 cm

Other findings: No pelvic fluid or separate adnexal masses are
seen.
IMPRESSION: Unremarkable pelvic ultrasound with no focal abnormality noted.

## 2012-08-12 ENCOUNTER — Other Ambulatory Visit: Payer: Self-pay | Admitting: Family Medicine

## 2012-08-12 NOTE — Telephone Encounter (Signed)
Medication refilled per protocol. 

## 2012-10-04 ENCOUNTER — Other Ambulatory Visit: Payer: Self-pay | Admitting: Family Medicine

## 2012-10-12 ENCOUNTER — Other Ambulatory Visit: Payer: Self-pay | Admitting: Family Medicine

## 2012-11-03 ENCOUNTER — Other Ambulatory Visit: Payer: Self-pay | Admitting: Physician Assistant

## 2012-11-30 ENCOUNTER — Other Ambulatory Visit: Payer: Self-pay | Admitting: Family Medicine

## 2012-12-26 ENCOUNTER — Encounter: Payer: Self-pay | Admitting: Family Medicine

## 2012-12-26 ENCOUNTER — Ambulatory Visit (INDEPENDENT_AMBULATORY_CARE_PROVIDER_SITE_OTHER): Payer: Self-pay | Admitting: Family Medicine

## 2012-12-26 VITALS — BP 120/88 | HR 100 | Temp 98.3°F | Resp 18 | Ht 63.0 in | Wt 171.0 lb

## 2012-12-26 DIAGNOSIS — J45909 Unspecified asthma, uncomplicated: Secondary | ICD-10-CM

## 2012-12-26 DIAGNOSIS — E119 Type 2 diabetes mellitus without complications: Secondary | ICD-10-CM

## 2012-12-26 MED ORDER — PREDNISONE 10 MG PO TABS: ORAL_TABLET | ORAL | Status: DC

## 2012-12-26 MED ORDER — AZITHROMYCIN 250 MG PO TABS: ORAL_TABLET | ORAL | Status: DC

## 2012-12-26 NOTE — Assessment & Plan Note (Signed)
Needs f/u in office for DM and labs For now will give her Novology Short acting to cover meals while on prednisone She has had many instances where CBG shoot up to 400's

## 2012-12-26 NOTE — Assessment & Plan Note (Signed)
Depo medrol given in office, start prednisone taper Antibiotics albuterol

## 2012-12-26 NOTE — Patient Instructions (Signed)
Take mucniex Use inhaler every 4 hours Take steroids and antibiotics Given yourself - Novolog sliding scale with each meal    150-200 - 2 units    201-250 - 4 units    251-300- 6 units   301-350- 8 units   351-400 10 units Schedule f/u Dr. Tanya Nones- 4 weeks for diabetes, come fasting for labs

## 2012-12-26 NOTE — Progress Notes (Signed)
  Subjective:    Patient ID: [name], female    DOB: March 25, 1976, 37 y.o.   MRN: [id number]  HPI  Pt here with cough, congestion, wheezing for the past 2 weeks. Cough productive of yellow sputum thick in nature. Sinus drainage of clear mucous. Cough worse at night. Taking mucinex , using albuterol as needed. Continues to smoke but cut back significantly. No sick contacts, no fever. Has history of DM as well, every time she has been on prednisone requires SSI to cover meals due to hyperglycemia.    Review of Systems  GEN- denies fatigue, fever, weight loss,weakness, recent illness HEENT- denies eye drainage, change in vision, nasal discharge, CVS- denies chest pain, palpitations RESP- denies SOB,+ cough, +wheeze ABD- denies N/V, change in stools, abd pain Endo- denies hypoglycemia, polyuria, polydipsia Neuro- denies headache, dizziness, syncope, seizure activity      Objective:   Physical Exam GEN- NAD, alert and oriented x3 HEENT- PERRL, EOMI, non injected sclera, pink conjunctiva, MMM, oropharynx mild injection, TM clear bilat no effusion, no maxillary sinus tenderness,nares clear Neck- Supple, no LAD CVS- RRR, no murmur RESP-Bilateral wheezing, course BS, good air movement EXT- No edema Pulses- Radial 2+         Assessment & Plan:

## 2012-12-31 ENCOUNTER — Other Ambulatory Visit: Payer: Self-pay | Admitting: Physician Assistant

## 2012-12-31 ENCOUNTER — Other Ambulatory Visit: Payer: Self-pay | Admitting: Family Medicine

## 2013-01-02 NOTE — Telephone Encounter (Signed)
Diabetic supplies refilled 

## 2013-01-23 ENCOUNTER — Encounter: Payer: Self-pay | Admitting: Family Medicine

## 2013-01-23 ENCOUNTER — Ambulatory Visit (INDEPENDENT_AMBULATORY_CARE_PROVIDER_SITE_OTHER): Payer: Self-pay | Admitting: Family Medicine

## 2013-01-23 VITALS — BP 122/82 | HR 88 | Temp 97.3°F | Resp 20 | Wt 170.0 lb

## 2013-01-23 DIAGNOSIS — J42 Unspecified chronic bronchitis: Secondary | ICD-10-CM

## 2013-01-23 LAB — COMPLETE METABOLIC PANEL WITHOUT GFR
ALT: 17 U/L (ref 0–35)
AST: 14 U/L (ref 0–37)
Albumin: 4.1 g/dL (ref 3.5–5.2)
Alkaline Phosphatase: 56 U/L (ref 39–117)
BUN: 10 mg/dL (ref 6–23)
CO2: 23 meq/L (ref 19–32)
Calcium: 9.2 mg/dL (ref 8.4–10.5)
Chloride: 102 meq/L (ref 96–112)
Creat: 0.62 mg/dL (ref 0.50–1.10)
GFR, Est African American: >89 mL/min
GFR, Est Non African American: >89 mL/min
Glucose, Bld: 190 mg/dL — ABNORMAL HIGH (ref 70–99)
Potassium: 3.7 meq/L (ref 3.5–5.3)
Sodium: 136 meq/L (ref 135–145)
Total Bilirubin: 0.3 mg/dL (ref 0.3–1.2)
Total Protein: 6.7 g/dL (ref 6.0–8.3)

## 2013-01-23 LAB — LIPID PANEL
Cholesterol: 172 mg/dL (ref 0–200)
LDL Cholesterol: 94 mg/dL (ref 0–99)
Total CHOL/HDL Ratio: 5.7 Ratio
Triglycerides: 242 mg/dL — ABNORMAL HIGH (ref <150)
VLDL: 48 mg/dL — ABNORMAL HIGH (ref 0–40)

## 2013-01-23 LAB — HEMOGLOBIN A1C
Hgb A1c MFr Bld: 9.6 % — ABNORMAL HIGH
Mean Plasma Glucose: 229 mg/dL — ABNORMAL HIGH

## 2013-01-23 LAB — MICROALBUMIN, URINE: Microalb, Ur: 4.56 mg/dL — ABNORMAL HIGH (ref 0.00–1.89)

## 2013-01-23 MED ORDER — INSULIN ASPART 100 UNIT/ML ~~LOC~~ SOLN: 7.0000 [IU] | Freq: Three times a day (TID) | SUBCUTANEOUS | Status: DC

## 2013-01-23 NOTE — Progress Notes (Signed)
Subjective:    Patient ID: [name], female    DOB: Jul 21, 1975, 37 y.o.   MRN: [id number]  HPI Patient has been diabetic since age 25 when she became pregnant. At the time she weighed 100-110 pounds. She has never been morbidly obese. Based on his history I feel that she is most likely a type I.5 diabetic. She is currently on Lantus 30 units subcutaneous daily, and metformin 500 mg by mouth twice a day. She states her fasting blood sugars range 100-140 but typically less than 120. However her postprandial sugars are 200-220. She denies hypoglycemia.  She was seen at the end of September for bronchitis and was placed on prednisone. She continues to smoke. She tried Chantix but was unable to for the medication. She is having to use albuterol frequently at night due to coughing and wheezing. She denies any fevers chills or hemoptysis. Past Medical History  Diagnosis Date  . Diabetes mellitus     19 years ago  . Muscle spasms of lower extremity     legs and weakness  . Hypercholesteremia   . Asthma    Current Outpatient Prescriptions on File Prior to Visit  Medication Sig Dispense Refill  . ACCU-CHEK AVIVA PLUS test strip USE AS DIRECTED  100 each  11  . ACCU-CHEK SOFTCLIX LANCETS lancets USE AS DIRECTED  100 each  11  . BD PEN NEEDLE NANO U/F 32G X 4 MM MISC USE AS DIRECTED 3 TIMES A DAY  100 each  11  . gabapentin (NEURONTIN) 300 MG capsule Take 300 mg by mouth 2 (two) times daily.       Marland Kitchen LANTUS SOLOSTAR 100 UNIT/ML SOPN INJECT 38 UNITS SUBCUTANEOUSLY DAILY  1 pen  2  . losartan (COZAAR) 100 MG tablet TAKE 1 TABLET EVERY DAY  30 tablet  2  . metFORMIN (GLUCOPHAGE) 500 MG tablet Take 500 mg by mouth 2 (two) times daily.        . Multiple Vitamin (MULTIVITAMIN) tablet Take 1 tablet by mouth daily.        Marland Kitchen PROAIR HFA 108 (90 BASE) MCG/ACT inhaler 2 PUFFS EVERY 6 HOURS AS NEEDED  8.5 each  3  . azithromycin (ZITHROMAX) 250 MG tablet Take 2 tablets x 1 day, then 1 tablet daily x 4 days   6 tablet  0  . norgestimate-ethinyl estradiol (SPRINTEC 28) 0.25-35 MG-MCG tablet Take 1 tablet by mouth daily.  1 Package  11  . predniSONE (DELTASONE) 10 MG tablet Take 40mg  daily x 3 days, then 20 mg x 3 days, then 10mg  x 3 days  21 tablet  0   No current facility-administered medications on file prior to visit.   Allergies  Allergen Reactions  . Demerol Itching and Rash  . Morphine And Related Itching and Rash       . Zocor [Simvastatin] Itching and Other (See Comments)    Leg pain   History   Social History  . Marital Status: Single    Spouse Name: N/A    Number of Children: N/A  . Years of Education: N/A   Occupational History  . Not on file.   Social History Main Topics  . Smoking status: Current Every Day Smoker -- 1.00 packs/day for 6 years    Types: Cigarettes  . Smokeless tobacco: Never Used  . Alcohol Use: .6 - 1.2 oz/week    1-2 Glasses of wine per week     Comment: 1-2 a year  . Drug  Use: No  . Sexual Activity: Yes    Partners: Male    Birth Control/ Protection: None, Surgical     Comment: tubal    Other Topics Concern  . Not on file   Social History Narrative  . No narrative on file      Review of Systems  All other systems reviewed and are negative.       Objective:   Physical Exam  Vitals reviewed. Neck: Neck supple. No JVD present.  Cardiovascular: Normal rate, regular rhythm, normal heart sounds and intact distal pulses.  Exam reveals no gallop and no friction rub.   No murmur heard. Pulmonary/Chest: Effort normal and breath sounds normal. No respiratory distress. She has no wheezes. She has no rales. She exhibits no tenderness.  Abdominal: Soft. Bowel sounds are normal. She exhibits no distension and no mass. There is no tenderness. There is no rebound and no guarding.  Musculoskeletal: She exhibits no edema and no tenderness.  Lymphadenopathy:    She has no cervical adenopathy.  Skin: Skin is warm. No rash noted. No erythema. No  pallor.  Psychiatric: She has a normal mood and affect. Her behavior is normal. Judgment and thought content normal.          Assessment & Plan:  1. Type II or unspecified type diabetes mellitus without mention of complication, uncontrolled Continue Lantus 38 units subcutaneous daily. Add NovoLog 7 units with meals. Recheck fasting blood sugars and 2 hour postprandial sugars in one week to titrate insulin further. Also check a fasting lipid panel and a urine microalbumin. Her goal LDL is less than 100. - Microalbumin, urine - Hemoglobin A1c - COMPLETE METABOLIC PANEL WITH GFR - Lipid panel  2. Unspecified chronic bronchitis I emphasized that she must quit smoking. Her exam is normal today. Therefore I believe she is a chronic bronchitis. I recommended Symbicort 160/0.5, 2 puffs inhaled twice a day for the next 2 weeks. Once cough improves she can discontinue medication.

## 2013-01-26 ENCOUNTER — Telehealth: Payer: Self-pay | Admitting: Family Medicine

## 2013-01-26 NOTE — Telephone Encounter (Signed)
Pt aware on uncontrolled diabetes.  Told to increase her Novolog by 7 units with each meal.  Cautioned her about renal failure.  Told her to due daily FBS and 2hr pp and bring to see Dr in one week

## 2013-01-26 NOTE — Telephone Encounter (Signed)
Message copied by Donne Anon on Thu Jan 26, 2013  5:05 PM ------      Message from: Lynnea Ferrier      Created: Tue Jan 24, 2013  7:14 AM       a1c is 9.6 showing sugars are out of control.  Microalbumen is in urine indicating damage to her kidneys from diabetes.  She needs to add novolog 7 units before EVERY meal.  And bring me fbs and 2 hr pps in 1 week. ------

## 2013-01-29 ENCOUNTER — Other Ambulatory Visit: Payer: Self-pay | Admitting: Physician Assistant

## 2013-01-30 NOTE — Telephone Encounter (Signed)
Medication refilled per protocol. 

## 2013-02-02 ENCOUNTER — Encounter: Payer: Self-pay | Admitting: Family Medicine

## 2013-02-02 ENCOUNTER — Ambulatory Visit (INDEPENDENT_AMBULATORY_CARE_PROVIDER_SITE_OTHER): Payer: Self-pay | Admitting: Family Medicine

## 2013-02-02 VITALS — BP 110/78 | HR 100 | Temp 98.7°F | Resp 16 | Ht 63.0 in | Wt 176.0 lb

## 2013-02-02 MED ORDER — INSULIN ASPART 100 UNIT/ML FLEXPEN: 7.0000 [IU] | PEN_INJECTOR | Freq: Three times a day (TID) | SUBCUTANEOUS | Status: DC

## 2013-02-02 NOTE — Progress Notes (Signed)
Subjective:    Patient ID: [name], female    DOB: 1975/12/05, 37 y.o.   MRN: [id number]  HPI 01/23/13 Patient has been diabetic since age 74 when she became pregnant. At the time she weighed 100-110 pounds. She has never been morbidly obese. Based on his history I feel that she is most likely a type I.5 diabetic. She is currently on Lantus 30 units subcutaneous daily, and metformin 500 mg by mouth twice a day. She states her fasting blood sugars range 100-140 but typically less than 120. However her postprandial sugars are 200-220. She denies hypoglycemia.  She was seen at the end of September for bronchitis and was placed on prednisone. She continues to smoke. She tried Chantix but was unable to for the medication. She is having to use albuterol frequently at night due to coughing and wheezing. She denies any fevers chills or hemoptysis.  At that time, my plan was: 1. Type II or unspecified type diabetes mellitus without mention of complication, uncontrolled Continue Lantus 38 units subcutaneous daily. Add NovoLog 7 units with meals. Recheck fasting blood sugars and 2 hour postprandial sugars in one week to titrate insulin further. Also check a fasting lipid panel and a urine microalbumin. Her goal LDL is less than 100. - Microalbumin, urine - Hemoglobin A1c - COMPLETE METABOLIC PANEL WITH GFR - Lipid panel  2. Unspecified chronic bronchitis I emphasized that she must quit smoking. Her exam is normal today. Therefore I believe she is a chronic bronchitis. I recommended Symbicort 160/0.5, 2 puffs inhaled twice a day for the next 2 weeks. Once cough improves she can discontinue medication. Office Visit on 01/23/2013  Component Date Value Range Status  . Microalb, Ur 01/23/2013 4.56* 0.00 - 1.89 mg/dL Final  . Hemoglobin W0J 01/23/2013 9.6* <5.7 % Final   Comment:                                                                                                 According to the ADA  Clinical Practice Recommendations for 2011, when                          HbA1c is used as a screening test:                                                       >=6.5%   Diagnostic of Diabetes Mellitus                                     (if abnormal result is confirmed)  5.7-6.4%   Increased risk of developing Diabetes Mellitus                                                     References:Diagnosis and Classification of Diabetes Mellitus,Diabetes                          Care,2011,34(Suppl 1):S62-S69 and Standards of Medical Care in                                  Diabetes - 2011,Diabetes Care,2011,34 (Suppl 1):S11-S61.                             . Mean Plasma Glucose 01/23/2013 229* <117 mg/dL Final  . Sodium 09/81/1914 136  135 - 145 mEq/L Final  . Potassium 01/23/2013 3.7  3.5 - 5.3 mEq/L Final  . Chloride 01/23/2013 102  96 - 112 mEq/L Final  . CO2 01/23/2013 23  19 - 32 mEq/L Final  . Glucose, Bld 01/23/2013 190* 70 - 99 mg/dL Final  . BUN 78/29/5621 10  6 - 23 mg/dL Final  . Creat 30/86/5784 0.62  0.50 - 1.10 mg/dL Final  . Total Bilirubin 01/23/2013 0.3  0.3 - 1.2 mg/dL Final  . Alkaline Phosphatase 01/23/2013 56  39 - 117 U/L Final  . AST 01/23/2013 14  0 - 37 U/L Final  . ALT 01/23/2013 17  0 - 35 U/L Final  . Total Protein 01/23/2013 6.7  6.0 - 8.3 g/dL Final  . Albumin 69/62/9528 4.1  3.5 - 5.2 g/dL Final  . Calcium 41/32/4401 9.2  8.4 - 10.5 mg/dL Final  . GFR, Est African American 01/23/2013 >89   Final  . GFR, Est Non African American 01/23/2013 >89   Final   Comment:                            The estimated GFR is a calculation valid for adults (>=81 years old)                          that uses the CKD-EPI algorithm to adjust for age and sex. It is                            not to be used for children, pregnant women, hospitalized patients,                             patients on dialysis, or with rapidly  changing kidney function.                          According to the NKDEP, eGFR >89 is normal, 60-89 shows mild                          impairment, 30-59 shows moderate impairment, 15-29 shows severe  impairment and <15 is ESRD.                             Marland Kitchen Cholesterol 01/23/2013 172  0 - 200 mg/dL Final   Comment: ATP III Classification:                                < 200        mg/dL        Desirable                               200 - 239     mg/dL        Borderline High                               >= 240        mg/dL        High                             . Triglycerides 01/23/2013 242* <150 mg/dL Final  . HDL 60/45/4098 30* >39 mg/dL Final  . Total CHOL/HDL Ratio 01/23/2013 5.7   Final  . VLDL 01/23/2013 48* 0 - 40 mg/dL Final  . LDL Cholesterol 01/23/2013 94  0 - 99 mg/dL Final   Comment:                            Total Cholesterol/HDL Ratio:CHD Risk                                                 Coronary Heart Disease Risk Table                                                                 Men       Women                                   1/2 Average Risk              3.4        3.3                                       Average Risk              5.0        4.4                                    2X Average Risk              9.6  7.1                                    3X Average Risk             23.4       11.0                          Use the calculated Patient Ratio above and the CHD Risk table                           to determine the patient's CHD Risk.                          ATP III Classification (LDL):                                < 100        mg/dL         Optimal                               100 - 129     mg/dL         Near or Above Optimal                               130 - 159     mg/dL         Borderline High                               160 - 189     mg/dL         High                                > 190        mg/dL          Very High                              She is here today for a recheck.  Fasting blood sugars ranging 100-140. Two-hour postprandial sugars are ranging 180-220. She is currently using 7 units of NovoLog with meals. The majority of her high values or in the evenings after supper.   Past Medical History  Diagnosis Date  . Diabetes mellitus     19 years ago  . Muscle spasms of lower extremity     legs and weakness  . Hypercholesteremia   . Asthma    Current Outpatient Prescriptions on File Prior to Visit  Medication Sig Dispense Refill  . ACCU-CHEK AVIVA PLUS test strip USE AS DIRECTED  100 each  11  . ACCU-CHEK SOFTCLIX LANCETS lancets USE AS DIRECTED  100 each  11  . azithromycin (ZITHROMAX) 250 MG tablet Take 2 tablets x 1 day, then 1 tablet daily x 4 days  6 tablet  0  . BD PEN NEEDLE NANO U/F 32G X 4 MM MISC USE AS DIRECTED 3 TIMES  A DAY  100 each  11  . gabapentin (NEURONTIN) 300 MG capsule Take 300 mg by mouth 2 (two) times daily.       . insulin aspart (NOVOLOG) 100 UNIT/ML injection Inject 7 Units into the skin 3 (three) times daily before meals.  3 vial  PRN  . LANTUS SOLOSTAR 100 UNIT/ML SOPN INJECT 38 UNITS SUBCUTANEOUSLY DAILY  15 pen  1  . losartan (COZAAR) 100 MG tablet TAKE 1 TABLET EVERY DAY  30 tablet  2  . metFORMIN (GLUCOPHAGE) 500 MG tablet Take 500 mg by mouth 2 (two) times daily.        . Multiple Vitamin (MULTIVITAMIN) tablet Take 1 tablet by mouth daily.        . norgestimate-ethinyl estradiol (SPRINTEC 28) 0.25-35 MG-MCG tablet Take 1 tablet by mouth daily.  1 Package  11  . PROAIR HFA 108 (90 BASE) MCG/ACT inhaler 2 PUFFS EVERY 6 HOURS AS NEEDED  8.5 each  3   No current facility-administered medications on file prior to visit.   Allergies  Allergen Reactions  . Demerol Itching and Rash  . Morphine And Related Itching and Rash       . Zocor [Simvastatin] Itching and Other (See Comments)    Leg pain   History   Social History  . Marital Status:  Single    Spouse Name: N/A    Number of Children: N/A  . Years of Education: N/A   Occupational History  . Not on file.   Social History Main Topics  . Smoking status: Current Every Day Smoker -- 1.00 packs/day for 6 years    Types: Cigarettes  . Smokeless tobacco: Never Used  . Alcohol Use: .6 - 1.2 oz/week    1-2 Glasses of wine per week     Comment: 1-2 a year  . Drug Use: No  . Sexual Activity: Yes    Partners: Male    Birth Control/ Protection: None, Surgical     Comment: tubal    Other Topics Concern  . Not on file   Social History Narrative  . No narrative on file      Review of Systems  All other systems reviewed and are negative.       Objective:   Physical Exam  Vitals reviewed. Neck: Neck supple. No JVD present.  Cardiovascular: Normal rate, regular rhythm, normal heart sounds and intact distal pulses.  Exam reveals no gallop and no friction rub.   No murmur heard. Pulmonary/Chest: Effort normal and breath sounds normal. No respiratory distress. She has no wheezes. She has no rales. She exhibits no tenderness.  Abdominal: Soft. Bowel sounds are normal. She exhibits no distension and no mass. There is no tenderness. There is no rebound and no guarding.  Musculoskeletal: She exhibits no edema and no tenderness.  Lymphadenopathy:    She has no cervical adenopathy.  Skin: Skin is warm. No rash noted. No erythema. No pallor.  Psychiatric: She has a normal mood and affect. Her behavior is normal. Judgment and thought content normal.          Assessment & Plan:  1. Type II or unspecified type diabetes mellitus without mention of complication, uncontrolled Continue Lantus 38 units subcutaneous daily. Continue NovoLog 7 units with breakfast, 7 units with lunch, increase NovoLog to 10-15 units with supper depending upon her sugars. We want to try to achieve two-hour postprandial sugars under 160.  Then by administering 10 units and gradually increased to 15  units if she is not seeing postprandial sugars under 160.  Recheck in 3 months.

## 2013-03-01 ENCOUNTER — Telehealth: Payer: Self-pay | Admitting: Family Medicine

## 2013-03-01 ENCOUNTER — Other Ambulatory Visit: Payer: Self-pay | Admitting: Physician Assistant

## 2013-03-01 ENCOUNTER — Other Ambulatory Visit: Payer: Self-pay | Admitting: Family Medicine

## 2013-03-01 MED ORDER — ALBUTEROL SULFATE HFA 108 (90 BASE) MCG/ACT IN AERS: 1.0000 | INHALATION_SPRAY | Freq: Four times a day (QID) | RESPIRATORY_TRACT | Status: DC | PRN

## 2013-03-01 NOTE — Telephone Encounter (Signed)
Medication refilled per protocol. 

## 2013-03-01 NOTE — Telephone Encounter (Signed)
[facility] approved Losartan for dates 02/24/13 - 02/19/2014 - Pharmacy aware.      [email or web address] key for [facility]

## 2013-03-29 ENCOUNTER — Other Ambulatory Visit: Payer: Self-pay | Admitting: Family Medicine

## 2013-03-31 ENCOUNTER — Other Ambulatory Visit: Payer: Self-pay | Admitting: Family Medicine

## 2013-03-31 ENCOUNTER — Other Ambulatory Visit: Payer: Self-pay | Admitting: Physician Assistant

## 2013-04-30 ENCOUNTER — Other Ambulatory Visit: Payer: Self-pay | Admitting: Physician Assistant

## 2013-04-30 ENCOUNTER — Other Ambulatory Visit: Payer: Self-pay | Admitting: Family Medicine

## 2013-05-01 ENCOUNTER — Other Ambulatory Visit: Payer: Self-pay | Admitting: Family Medicine

## 2013-05-01 MED ORDER — INSULIN GLARGINE 100 UNIT/ML SOLOSTAR PEN: PEN_INJECTOR | SUBCUTANEOUS | Status: DC

## 2013-05-01 NOTE — Telephone Encounter (Signed)
Medication refilled per protocol. 

## 2013-05-01 NOTE — Telephone Encounter (Signed)
Rx Refilled  

## 2013-05-30 ENCOUNTER — Encounter: Payer: Self-pay | Admitting: Family Medicine

## 2013-05-30 ENCOUNTER — Telehealth: Payer: Self-pay | Admitting: Family Medicine

## 2013-05-30 MED ORDER — INSULIN ASPART 100 UNIT/ML FLEXPEN: 7.0000 [IU] | PEN_INJECTOR | Freq: Three times a day (TID) | SUBCUTANEOUS | Status: DC

## 2013-05-30 NOTE — Telephone Encounter (Signed)
Medication refill for one time only.  Patient needs to be seen.  Letter sent for patient to call and schedule 

## 2013-06-24 ENCOUNTER — Other Ambulatory Visit: Payer: Self-pay | Admitting: Family Medicine

## 2013-06-26 NOTE — Telephone Encounter (Signed)
Requires office visit before any further refills can be given.  

## 2013-07-22 ENCOUNTER — Other Ambulatory Visit: Payer: Self-pay | Admitting: Family Medicine

## 2013-07-29 ENCOUNTER — Other Ambulatory Visit: Payer: Self-pay | Admitting: Family Medicine

## 2013-08-22 ENCOUNTER — Encounter: Payer: Self-pay | Admitting: Family Medicine

## 2013-08-22 ENCOUNTER — Ambulatory Visit (INDEPENDENT_AMBULATORY_CARE_PROVIDER_SITE_OTHER): Payer: Medicaid Other | Admitting: Family Medicine

## 2013-08-22 VITALS — BP 142/80 | HR 94 | Temp 99.1°F | Resp 14 | Ht 63.0 in | Wt 168.0 lb

## 2013-08-22 DIAGNOSIS — E119 Type 2 diabetes mellitus without complications: Secondary | ICD-10-CM

## 2013-08-22 LAB — HEMOGLOBIN A1C
Hgb A1c MFr Bld: 9.2 % — ABNORMAL HIGH (ref <5.7)
Mean Plasma Glucose: 217 mg/dL — ABNORMAL HIGH (ref <117)

## 2013-08-22 LAB — COMPLETE METABOLIC PANEL WITH GFR
ALBUMIN: 4.3 g/dL (ref 3.5–5.2)
ALT: 20 U/L (ref 0–35)
AST: 20 U/L (ref 0–37)
Alkaline Phosphatase: 55 U/L (ref 39–117)
BUN: 9 mg/dL (ref 6–23)
CALCIUM: 9.8 mg/dL (ref 8.4–10.5)
CO2: 26 meq/L (ref 19–32)
Chloride: 102 mEq/L (ref 96–112)
Creat: 0.72 mg/dL (ref 0.50–1.10)
GLUCOSE: 201 mg/dL — AB (ref 70–99)
POTASSIUM: 4.4 meq/L (ref 3.5–5.3)
SODIUM: 137 meq/L (ref 135–145)
TOTAL PROTEIN: 6.8 g/dL (ref 6.0–8.3)
Total Bilirubin: 0.3 mg/dL (ref 0.2–1.2)

## 2013-08-22 LAB — MICROALBUMIN, URINE: Microalb, Ur: 0.94 mg/dL (ref 0.00–1.89)

## 2013-08-22 NOTE — Progress Notes (Signed)
Subjective:    Patient ID: [name], female    DOB: 1976-01-14, 38 y.o.   MRN: [id number]  HPI Patient is here today for followup of her diabetes. She is currently using Lantus 38 units subcutaneous daily and NovoLog 7-12 units with meals depending on her pre-meal blood sugar.  Unfortunately she has been unable to check her blood sugar the last 3 weeks due to defective meter. Prior to her meter breaking, her fasting blood sugars were between 100 110. Her postprandial sugars were under 200. She is having used her albuterol frequently due to wheezing and asthma. Unfortunate she continues to smoke. She feels like she needs to resume her preventative medication. Past Medical History  Diagnosis Date  . Diabetes mellitus     19 years ago  . Muscle spasms of lower extremity     legs and weakness  . Hypercholesteremia   . Asthma    Current Outpatient Prescriptions on File Prior to Visit  Medication Sig Dispense Refill  . ACCU-CHEK AVIVA PLUS test strip USE AS DIRECTED  100 each  8  . ACCU-CHEK SOFTCLIX LANCETS lancets USE AS DIRECTED  100 each  9  . albuterol (PROAIR HFA) 108 (90 BASE) MCG/ACT inhaler Inhale 1-2 puffs into the lungs every 6 (six) hours as needed for wheezing or shortness of breath.  8.5 each  11  . BD PEN NEEDLE NANO U/F 32G X 4 MM MISC USE AS DIRECTED 3 TIMES A DAY  100 each  9  . gabapentin (NEURONTIN) 300 MG capsule Take 300 mg by mouth 2 (two) times daily.       . insulin aspart (NOVOLOG) 100 UNIT/ML injection Inject 7 Units into the skin 3 (three) times daily before meals.  3 vial  PRN  . Insulin Glargine (LANTUS SOLOSTAR) 100 UNIT/ML Solostar Pen INJECT 38 UNITS SUBCUTANEOUSLY DAILY  15 pen  3  . losartan (COZAAR) 100 MG tablet TAKE 1 TABLET EVERY DAY  30 tablet  0  . metFORMIN (GLUCOPHAGE) 500 MG tablet TAKE 1 TABLET BY MOUTH TWICE A DAY  60 tablet  5  . Multiple Vitamin (MULTIVITAMIN) tablet Take 1 tablet by mouth daily.        Marland Kitchen NOVOLOG FLEXPEN 100 UNIT/ML  FlexPen INJECT 7 UNITS INTO THE SKIN 3 (THREE) TIMES DAILY WITH MEALS.  15 pen  6  . PROAIR HFA 108 (90 BASE) MCG/ACT inhaler 2 PUFFS EVERY 6 HOURS AS NEEDED  8.5 each  11  . PROAIR HFA 108 (90 BASE) MCG/ACT inhaler 2 PUFFS EVERY 6 HOURS AS NEEDED  8.5 each  3  . SPRINTEC 28 0.25-35 MG-MCG tablet TAKE 1 TABLET EVERY DAY  28 tablet  11   No current facility-administered medications on file prior to visit.   Allergies  Allergen Reactions  . Demerol Itching and Rash  . Morphine And Related Itching and Rash       . Zocor [Simvastatin] Itching and Other (See Comments)    Leg pain   History   Social History  . Marital Status: Single    Spouse Name: N/A    Number of Children: N/A  . Years of Education: N/A   Occupational History  . Not on file.   Social History Main Topics  . Smoking status: Current Every Day Smoker -- 1.00 packs/day for 6 years    Types: Cigarettes  . Smokeless tobacco: Never Used  . Alcohol Use: 0.6 - 1.2 oz/week    1-2 Glasses of wine  per week     Comment: 1-2 a year  . Drug Use: No  . Sexual Activity: Yes    Partners: Male    Birth Control/ Protection: None, Surgical     Comment: tubal    Other Topics Concern  . Not on file   Social History Narrative  . No narrative on file      Review of Systems  All other systems reviewed and are negative.      Objective:   Physical Exam  Vitals reviewed. Constitutional: She appears well-developed and well-nourished. No distress.  HENT:  Nose: Nose normal.  Mouth/Throat: Oropharynx is clear and moist. No oropharyngeal exudate.  Eyes: Conjunctivae are normal. No scleral icterus.  Neck: Neck supple. No thyromegaly present.  Cardiovascular: Normal rate, regular rhythm and normal heart sounds.   No murmur heard. Pulmonary/Chest: Effort normal and breath sounds normal. No respiratory distress. She has no wheezes. She has no rales.  Abdominal: Soft. Bowel sounds are normal.  Lymphadenopathy:    She has no  cervical adenopathy.  Skin: She is not diaphoretic.          Assessment & Plan:  1. Type II or unspecified type diabetes mellitus without mention of complication, not stated as uncontrolled Check hemoglobin A1c. Hemoglobin A1c is much greater and 7, I would add januvia to help bring down her postprandial sugars. If her hemoglobin A1c is between 7-8, d/c novolog with meals and add [place].  If less than 7, make no changes.  Also check a urine microalbumin.  I gave her symbicort 160/4.5 2 puff inh bid and encouraged smoking cessation. - HgB A1c - COMPLETE METABOLIC PANEL WITH GFR - Microalbumin, urine

## 2013-08-25 ENCOUNTER — Other Ambulatory Visit: Payer: Medicaid Other | Admitting: Family Medicine

## 2013-08-28 ENCOUNTER — Ambulatory Visit (INDEPENDENT_AMBULATORY_CARE_PROVIDER_SITE_OTHER): Payer: Medicaid Other | Admitting: Family Medicine

## 2013-08-28 ENCOUNTER — Other Ambulatory Visit: Payer: Self-pay | Admitting: Family Medicine

## 2013-08-28 ENCOUNTER — Encounter: Payer: Self-pay | Admitting: Family Medicine

## 2013-08-28 VITALS — BP 160/90 | HR 110 | Temp 98.5°F | Resp 18 | Ht 63.0 in | Wt 170.0 lb

## 2013-08-28 DIAGNOSIS — Z124 Encounter for screening for malignant neoplasm of cervix: Secondary | ICD-10-CM

## 2013-08-28 DIAGNOSIS — I10 Essential (primary) hypertension: Secondary | ICD-10-CM

## 2013-08-28 MED ORDER — ACCU-CHEK AVIVA PLUS W/DEVICE KIT: PACK | Status: DC

## 2013-08-28 MED ORDER — AMLODIPINE BESYLATE 5 MG PO TABS: 5.0000 mg | ORAL_TABLET | Freq: Every day | ORAL | Status: DC

## 2013-08-28 MED ORDER — SITAGLIPTIN PHOSPHATE 100 MG PO TABS: 100.0000 mg | ORAL_TABLET | Freq: Every day | ORAL | Status: DC

## 2013-08-28 NOTE — Addendum Note (Signed)
Addended by: Shary Decamp B on: 08/28/2013 09:27 AM   Modules accepted: Orders

## 2013-08-28 NOTE — Progress Notes (Signed)
Subjective:    Patient ID: [name], female    DOB: October 01, 1975, 38 y.o.   MRN: [id number]  HPI  Patient is a very pleasant 38 year old white female who is here today for her Pap smear. Of note her blood pressure is significantly elevated today at 160/90. She is nervous about receiving her Pap smear. However her blood pressure was also elevated at her last office visit. She also has a history of smoking and diabetes. Therefore I would like to keep her blood pressure less than 140/90. Past Medical History  Diagnosis Date  . Diabetes mellitus     19 years ago  . Muscle spasms of lower extremity     legs and weakness  . Hypercholesteremia   . Asthma    Current Outpatient Prescriptions on File Prior to Visit  Medication Sig Dispense Refill  . ACCU-CHEK AVIVA PLUS test strip USE AS DIRECTED  100 each  8  . ACCU-CHEK SOFTCLIX LANCETS lancets USE AS DIRECTED  100 each  9  . albuterol (PROAIR HFA) 108 (90 BASE) MCG/ACT inhaler Inhale 1-2 puffs into the lungs every 6 (six) hours as needed for wheezing or shortness of breath.  8.5 each  11  . BD PEN NEEDLE NANO U/F 32G X 4 MM MISC USE AS DIRECTED 3 TIMES A DAY  100 each  9  . gabapentin (NEURONTIN) 300 MG capsule Take 300 mg by mouth 2 (two) times daily.       . insulin aspart (NOVOLOG) 100 UNIT/ML injection Inject 7 Units into the skin 3 (three) times daily before meals.  3 vial  PRN  . Insulin Glargine (LANTUS SOLOSTAR) 100 UNIT/ML Solostar Pen INJECT 38 UNITS SUBCUTANEOUSLY DAILY  15 pen  3  . losartan (COZAAR) 100 MG tablet TAKE 1 TABLET EVERY DAY  30 tablet  0  . metFORMIN (GLUCOPHAGE) 500 MG tablet TAKE 1 TABLET BY MOUTH TWICE A DAY  60 tablet  5  . Multiple Vitamin (MULTIVITAMIN) tablet Take 1 tablet by mouth daily.        Marland Kitchen NOVOLOG FLEXPEN 100 UNIT/ML FlexPen INJECT 7 UNITS INTO THE SKIN 3 (THREE) TIMES DAILY WITH MEALS.  15 pen  6  . PROAIR HFA 108 (90 BASE) MCG/ACT inhaler 2 PUFFS EVERY 6 HOURS AS NEEDED  8.5 each  11  . PROAIR  HFA 108 (90 BASE) MCG/ACT inhaler 2 PUFFS EVERY 6 HOURS AS NEEDED  8.5 each  3  . SPRINTEC 28 0.25-35 MG-MCG tablet TAKE 1 TABLET EVERY DAY  28 tablet  11   No current facility-administered medications on file prior to visit.   Allergies  Allergen Reactions  . Demerol Itching and Rash  . Morphine And Related Itching and Rash       . Zocor [Simvastatin] Itching and Other (See Comments)    Leg pain   History   Social History  . Marital Status: Single    Spouse Name: N/A    Number of Children: N/A  . Years of Education: N/A   Occupational History  . Not on file.   Social History Main Topics  . Smoking status: Current Every Day Smoker -- 1.00 packs/day for 6 years    Types: Cigarettes  . Smokeless tobacco: Never Used  . Alcohol Use: 0.6 - 1.2 oz/week    1-2 Glasses of wine per week     Comment: 1-2 a year  . Drug Use: No  . Sexual Activity: Yes    Partners: Male  Birth Control/ Protection: None, Surgical     Comment: tubal    Other Topics Concern  . Not on file   Social History Narrative  . No narrative on file   Family History  Problem Relation Age of Onset  . Diabetes Mother   . Parkinsonism Mother   . Diabetes Father   . Hypertension Father   . Diabetes Brother      Review of Systems  All other systems reviewed and are negative.      Objective:   Physical Exam  Vitals reviewed. Cardiovascular: Normal rate and regular rhythm.   No murmur heard. Pulmonary/Chest: Effort normal and breath sounds normal. No respiratory distress. She has no wheezes. She has no rales.  Genitourinary: Vagina normal and uterus normal. No vaginal discharge found.   Dried blood in posterior fornix but patient recently finished her period.       Assessment & Plan:  1. Encounter for Papanicolaou smear of cervix Pap smear was sent to pathology - PAP, Thin Prep w/HPV rflx HPV Type 16/18  2. HTN (hypertension) Add amlodipine 5 mg by mouth daily and recheck blood pressure  in 3-4 weeks. Also recommended smoking cessation. - amLODipine (NORVASC) 5 MG tablet; Take 1 tablet (5 mg total) by mouth daily.  Dispense: 30 tablet; Refill: 11

## 2013-08-30 LAB — PAP, THIN PREP W/HPV RFLX HPV TYPE 16/18: HPV DNA High Risk: DETECTED — AB

## 2013-08-30 LAB — HPV TYPE 16/18
HPV GENOTYPE, 16: DETECTED — AB
HPV Genotype, 18: NOT DETECTED

## 2013-09-26 ENCOUNTER — Other Ambulatory Visit: Payer: Self-pay | Admitting: Physician Assistant

## 2013-09-26 ENCOUNTER — Other Ambulatory Visit: Payer: Self-pay | Admitting: Family Medicine

## 2013-09-27 NOTE — Telephone Encounter (Signed)
Refill appropriate and filled per protocol. 

## 2013-10-11 ENCOUNTER — Telehealth: Payer: Self-pay | Admitting: Family Medicine

## 2013-10-11 NOTE — Telephone Encounter (Signed)
BP readings 122/91, 124/89, 119/86, 106/87, 121/95, 146/103, 137/102, 157/100, 146/106 Pulse              101,115,106,139,118,105,121,105,126  Last 2 readings is without medication as she did not get it filled in case you wanted to changer her dose.

## 2013-10-12 MED ORDER — AMLODIPINE BESYLATE 10 MG PO TABS: 10.0000 mg | ORAL_TABLET | Freq: Every day | ORAL | Status: DC

## 2013-10-12 NOTE — Telephone Encounter (Signed)
I'd increase amlodipine to 10 mg poqday.

## 2013-10-12 NOTE — Telephone Encounter (Signed)
Pt aware and med sent to pharm 

## 2013-11-07 ENCOUNTER — Encounter: Payer: Self-pay | Admitting: Family Medicine

## 2013-11-07 ENCOUNTER — Ambulatory Visit (INDEPENDENT_AMBULATORY_CARE_PROVIDER_SITE_OTHER): Payer: 59 | Admitting: Family Medicine

## 2013-11-07 VITALS — BP 120/76 | HR 98 | Temp 98.0°F | Resp 20 | Wt 167.0 lb

## 2013-11-07 DIAGNOSIS — R109 Unspecified abdominal pain: Secondary | ICD-10-CM

## 2013-11-07 LAB — CBC WITH DIFFERENTIAL/PLATELET
BASOS ABS: 0 10*3/uL (ref 0.0–0.1)
Basophils Relative: 0 % (ref 0–1)
EOS ABS: 0.1 10*3/uL (ref 0.0–0.7)
Eosinophils Relative: 1 % (ref 0–5)
HCT: 43 % (ref 36.0–46.0)
Hemoglobin: 14.6 g/dL (ref 12.0–15.0)
LYMPHS ABS: 3.9 10*3/uL (ref 0.7–4.0)
LYMPHS PCT: 41 % (ref 12–46)
MCH: 30 pg (ref 26.0–34.0)
MCHC: 34 g/dL (ref 30.0–36.0)
MCV: 88.5 fL (ref 78.0–100.0)
Monocytes Absolute: 0.4 10*3/uL (ref 0.1–1.0)
Monocytes Relative: 4 % (ref 3–12)
NEUTROS PCT: 54 % (ref 43–77)
Neutro Abs: 5.2 10*3/uL (ref 1.7–7.7)
PLATELETS: 191 10*3/uL (ref 150–400)
RBC: 4.86 MIL/uL (ref 3.87–5.11)
RDW: 13 % (ref 11.5–15.5)
WBC: 9.6 10*3/uL (ref 4.0–10.5)

## 2013-11-07 LAB — SEDIMENTATION RATE: SED RATE: 5 mm/h (ref 0–22)

## 2013-11-07 LAB — TSH: TSH: 1.344 u[IU]/mL (ref 0.350–4.500)

## 2013-11-07 MED ORDER — BUDESONIDE-FORMOTEROL FUMARATE 160-4.5 MCG/ACT IN AERO: 2.0000 | INHALATION_SPRAY | Freq: Two times a day (BID) | RESPIRATORY_TRACT | Status: DC

## 2013-11-07 NOTE — Progress Notes (Signed)
Subjective:    Patient ID: [name], female    DOB: 07/28/1975, 38 y.o.   MRN: [id number]  HPI Patient is here complaining of several months of diarrhea, bloating, intermittent constipation and abdominal pain.  She describes the abdominal pain is a burning sensation in epigastric area. His main by eating. She reports early satiety nausea and occasional vomiting. She also has bloating and diarrhea on a daily basis. She denies any melena or hematochezia. She also has intermittent constipation about every 3 or 4 days. She denies any rectal pain. She denies any weight loss. She denies any rashes. She denies any exacerbating or alleviating triggers. Past Medical History  Diagnosis Date  . Diabetes mellitus     19 years ago  . Muscle spasms of lower extremity     legs and weakness  . Hypercholesteremia   . Asthma    Current Outpatient Prescriptions on File Prior to Visit  Medication Sig Dispense Refill  . ACCU-CHEK AVIVA PLUS test strip USE AS DIRECTED  100 each  8  . ACCU-CHEK SOFTCLIX LANCETS lancets USE AS DIRECTED  100 each  9  . amLODipine (NORVASC) 10 MG tablet Take 1 tablet (10 mg total) by mouth daily.  90 tablet  3  . BD PEN NEEDLE NANO U/F 32G X 4 MM MISC USE AS DIRECTED 3 TIMES A DAY  100 each  9  . Blood Glucose Monitoring Suppl (ACCU-CHEK AVIVA PLUS) W/DEVICE KIT Use as directed  1 kit  0  . gabapentin (NEURONTIN) 300 MG capsule Take 300 mg by mouth 2 (two) times daily.       . insulin aspart (NOVOLOG) 100 UNIT/ML injection Inject 7 Units into the skin 3 (three) times daily before meals.  3 vial  PRN  . Insulin Glargine (LANTUS SOLOSTAR) 100 UNIT/ML Solostar Pen INJECT 38 UNITS SUBCUTANEOUSLY DAILY  15 pen  3  . losartan (COZAAR) 100 MG tablet TAKE 1 TABLET EVERY DAY  30 tablet  11  . metFORMIN (GLUCOPHAGE) 500 MG tablet TAKE 1 TABLET BY MOUTH TWICE A DAY  60 tablet  5  . Multiple Vitamin (MULTIVITAMIN) tablet Take 1 tablet by mouth daily.        Marland Kitchen NOVOLOG FLEXPEN 100  UNIT/ML FlexPen INJECT 7 UNITS INTO THE SKIN 3 (THREE) TIMES DAILY WITH MEALS.  15 pen  6  . PROAIR HFA 108 (90 BASE) MCG/ACT inhaler 2 PUFFS EVERY 6 HOURS AS NEEDED  8.5 each  3  . SPRINTEC 28 0.25-35 MG-MCG tablet TAKE 1 TABLET EVERY DAY  28 tablet  11  . sitaGLIPtin (JANUVIA) 100 MG tablet Take 1 tablet (100 mg total) by mouth daily.  30 tablet  3   No current facility-administered medications on file prior to visit.   Past Surgical History  Procedure Laterality Date  . Tubal ligation  2002  . Dilation and curettage of uterus  1997  . Wisdom tooth extraction      age 36  . Cesarean section  1993, 1998, 2002   Allergies  Allergen Reactions  . Demerol Itching and Rash  . Morphine And Related Itching and Rash       . Zocor [Simvastatin] Itching and Other (See Comments)    Leg pain   History   Social History  . Marital Status: Single    Spouse Name: N/A    Number of Children: N/A  . Years of Education: N/A   Occupational History  . Not on file.   Social  History Main Topics  . Smoking status: Current Every Day Smoker -- 1.00 packs/day for 6 years    Types: Cigarettes  . Smokeless tobacco: Never Used  . Alcohol Use: 0.6 - 1.2 oz/week    1-2 Glasses of wine per week     Comment: 1-2 a year  . Drug Use: No  . Sexual Activity: Yes    Partners: Male    Birth Control/ Protection: None, Surgical     Comment: tubal    Other Topics Concern  . Not on file   Social History Narrative  . No narrative on file      Review of Systems  All other systems reviewed and are negative.      Objective:   Physical Exam  Vitals reviewed. Cardiovascular: Normal rate, regular rhythm and normal heart sounds.   No murmur heard. Pulmonary/Chest: Effort normal and breath sounds normal. No respiratory distress. She has no wheezes. She has no rales. She exhibits no tenderness.  Abdominal: Soft. Bowel sounds are normal. She exhibits no distension. There is no tenderness. There is no  rebound and no guarding.          Assessment & Plan:  1. Abdominal pain, unspecified site Differential diagnosis includes irritable bowel syndrome, inflammatory bowel disease, celiac disease, ulcer, gastroparesis, thyroid problems. Screen for inflammatory bowel disease by checking a CBC as well as a sedimentation rate. Check a thyroid cause by checking a TSH. Check a celiac panel to evaluate for celiac disease. Start the patient on dexilant 60 mg poqday for possible PUD and restora 1 poqday for possible IBS.  Recheck in 2 weeks. If symptoms persist and lab work is normal, consider GI referral for possible colonoscopy or possibly a gastric emptying study to evaluate for gastroparesis - CBC with Differential - Sedimentation rate - TSH - Celiac panel 10

## 2013-11-08 LAB — CELIAC PANEL 10
ENDOMYSIAL SCREEN: NEGATIVE
Gliadin IgA: 10.2 U/mL (ref <20)
Gliadin IgG: 6.4 U/mL (ref <20)
IgA: 240 mg/dL (ref 69–380)
Tissue Transglut Ab: 8.3 U/mL (ref <20)
Tissue Transglutaminase Ab, IgA: 8.6 U/mL (ref <20)

## 2013-11-23 ENCOUNTER — Encounter: Payer: Self-pay | Admitting: Family Medicine

## 2013-11-23 ENCOUNTER — Ambulatory Visit (INDEPENDENT_AMBULATORY_CARE_PROVIDER_SITE_OTHER): Payer: 59 | Admitting: Family Medicine

## 2013-11-23 VITALS — BP 100/60 | HR 110 | Temp 98.6°F | Resp 14 | Ht 63.0 in | Wt 169.0 lb

## 2013-11-23 DIAGNOSIS — R109 Unspecified abdominal pain: Secondary | ICD-10-CM

## 2013-11-23 DIAGNOSIS — K219 Gastro-esophageal reflux disease without esophagitis: Secondary | ICD-10-CM

## 2013-11-23 MED ORDER — PANTOPRAZOLE SODIUM 40 MG PO TBEC: 40.0000 mg | DELAYED_RELEASE_TABLET | Freq: Every day | ORAL | Status: DC

## 2013-11-23 NOTE — Progress Notes (Signed)
Subjective:    Patient ID: [name], female    DOB: 03/13/1976, 38 y.o.   MRN: [id number]  HPI 11/07/13 Patient is here complaining of several months of diarrhea, bloating, intermittent constipation and abdominal pain.  She describes the abdominal pain is a burning sensation in epigastric area. His main by eating. She reports early satiety nausea and occasional vomiting. She also has bloating and diarrhea on a daily basis. She denies any melena or hematochezia. She also has intermittent constipation about every 3 or 4 days. She denies any rectal pain. She denies any weight loss. She denies any rashes. She denies any exacerbating or alleviating triggers.  At that time, my plan was: 1. Abdominal pain, unspecified site Differential diagnosis includes irritable bowel syndrome, inflammatory bowel disease, celiac disease, ulcer, gastroparesis, thyroid problems. Screen for inflammatory bowel disease by checking a CBC as well as a sedimentation rate. Check a thyroid cause by checking a TSH. Check a celiac panel to evaluate for celiac disease. Start the patient on dexilant 60 mg poqday for possible PUD and restora 1 poqday for possible IBS.  Recheck in 2 weeks. If symptoms persist and lab work is normal, consider GI referral for possible colonoscopy or possibly a gastric emptying study to evaluate for gastroparesis - CBC with Differential - Sedimentation rate - TSH - Celiac panel 10  11/23/13 The burning pain in the patient's epigastric area resolved on dexilant and returned immediately upon discontinuation. She was unable to get restora.  She continues to have occasional diarrhea intermittent constipation. Her lab work was completely normal including a sedimentation rate of 5 making inflammatory bowel disease unlikely. Her most recent labwork as listed below: Office Visit on 11/07/2013  Component Date Value Ref Range Status  . WBC 11/07/2013 9.6  4.0 - 10.5 K/uL Final  . RBC 11/07/2013 4.86  3.87 -  5.11 MIL/uL Final  . Hemoglobin 11/07/2013 14.6  12.0 - 15.0 g/dL Final  . HCT 11/07/2013 43.0  36.0 - 46.0 % Final  . MCV 11/07/2013 88.5  78.0 - 100.0 fL Final  . MCH 11/07/2013 30.0  26.0 - 34.0 pg Final  . MCHC 11/07/2013 34.0  30.0 - 36.0 g/dL Final  . RDW 11/07/2013 13.0  11.5 - 15.5 % Final  . Platelets 11/07/2013 191  150 - 400 K/uL Final  . Neutrophils Relative % 11/07/2013 54  43 - 77 % Final  . Neutro Abs 11/07/2013 5.2  1.7 - 7.7 K/uL Final  . Lymphocytes Relative 11/07/2013 41  12 - 46 % Final  . Lymphs Abs 11/07/2013 3.9  0.7 - 4.0 K/uL Final  . Monocytes Relative 11/07/2013 4  3 - 12 % Final  . Monocytes Absolute 11/07/2013 0.4  0.1 - 1.0 K/uL Final  . Eosinophils Relative 11/07/2013 1  0 - 5 % Final  . Eosinophils Absolute 11/07/2013 0.1  0.0 - 0.7 K/uL Final  . Basophils Relative 11/07/2013 0  0 - 1 % Final  . Basophils Absolute 11/07/2013 0.0  0.0 - 0.1 K/uL Final  . Smear Review 11/07/2013 Criteria for review not met   Final  . Sed Rate 11/07/2013 5  0 - 22 mm/hr Final  . TSH 11/07/2013 1.344  0.350 - 4.500 uIU/mL Final  . IgA 11/07/2013 240  69 - 380 mg/dL Final  . Endomysial Screen 11/07/2013 NEGATIVE  NEGATIVE Final  . Tissue Transglutaminase Ab, IgA 11/07/2013 8.6  <20 U/mL Final   Comment:    Reference Range:                                  <  20     Negative                                  20-25   Equivocal                                  >25     Positive                                                     Between 2-3% of Celiac patients have selective IgA deficiency. If the                          tTG IgA result is negative but Celiac disease is still suspected,                          total IgA should be measured to identify possible selective IgA                          deficiency and to rule out a false negative. In cases of IgA                          deficiency, measurement of tTG IgG should be considered.                             . Gliadin  IgG 11/07/2013 6.4  <20 U/mL Final   Comment:    Reference Range:                                  <20     Negative                                  20-25   Equivocal                                  >25     Positive                             . Gliadin IgA 11/07/2013 10.2  <20 U/mL Final   Comment:    Reference Range:                                  <20     Negative                                  20-25   Equivocal                                  >  25     Positive                             . Tissue Transglut Ab 11/07/2013 8.3  <20 U/mL Final   Comment:    Reference Range:                                  <20     Negative                                  20-25   Equivocal                                  >25     Positive                               Past Medical History  Diagnosis Date  . Diabetes mellitus     19 years ago  . Muscle spasms of lower extremity     legs and weakness  . Hypercholesteremia   . Asthma    Current Outpatient Prescriptions on File Prior to Visit  Medication Sig Dispense Refill  . ACCU-CHEK AVIVA PLUS test strip USE AS DIRECTED  100 each  8  . ACCU-CHEK SOFTCLIX LANCETS lancets USE AS DIRECTED  100 each  9  . amLODipine (NORVASC) 10 MG tablet Take 1 tablet (10 mg total) by mouth daily.  90 tablet  3  . BD PEN NEEDLE NANO U/F 32G X 4 MM MISC USE AS DIRECTED 3 TIMES A DAY  100 each  9  . Blood Glucose Monitoring Suppl (ACCU-CHEK AVIVA PLUS) W/DEVICE KIT Use as directed  1 kit  0  . budesonide-formoterol (SYMBICORT) 160-4.5 MCG/ACT inhaler Inhale 2 puffs into the lungs 2 (two) times daily.  1 Inhaler  3  . gabapentin (NEURONTIN) 300 MG capsule Take 300 mg by mouth 2 (two) times daily.       . insulin aspart (NOVOLOG) 100 UNIT/ML injection Inject 7 Units into the skin 3 (three) times daily before meals.  3 vial  PRN  . Insulin Glargine (LANTUS SOLOSTAR) 100 UNIT/ML Solostar Pen INJECT 38 UNITS SUBCUTANEOUSLY DAILY  15 pen  3  . losartan (COZAAR) 100  MG tablet TAKE 1 TABLET EVERY DAY  30 tablet  11  . metFORMIN (GLUCOPHAGE) 500 MG tablet TAKE 1 TABLET BY MOUTH TWICE A DAY  60 tablet  5  . Multiple Vitamin (MULTIVITAMIN) tablet Take 1 tablet by mouth daily.        Marland Kitchen NOVOLOG FLEXPEN 100 UNIT/ML FlexPen INJECT 7 UNITS INTO THE SKIN 3 (THREE) TIMES DAILY WITH MEALS.  15 pen  6  . PROAIR HFA 108 (90 BASE) MCG/ACT inhaler 2 PUFFS EVERY 6 HOURS AS NEEDED  8.5 each  3  . sitaGLIPtin (JANUVIA) 100 MG tablet Take 1 tablet (100 mg total) by mouth daily.  30 tablet  3  . SPRINTEC 28 0.25-35 MG-MCG tablet TAKE 1 TABLET EVERY DAY  28 tablet  11   No current facility-administered medications on file prior to visit.   Past Surgical History  Procedure Laterality Date  . Tubal ligation  2002  . Dilation  and curettage of uterus  1997  . Wisdom tooth extraction      age 33  . Cesarean section  1993, 1998, 2002   Allergies  Allergen Reactions  . Demerol Itching and Rash  . Morphine And Related Itching and Rash       . Zocor [Simvastatin] Itching and Other (See Comments)    Leg pain   History   Social History  . Marital Status: Single    Spouse Name: N/A    Number of Children: N/A  . Years of Education: N/A   Occupational History  . Not on file.   Social History Main Topics  . Smoking status: Current Every Day Smoker -- 1.00 packs/day for 6 years    Types: Cigarettes  . Smokeless tobacco: Never Used  . Alcohol Use: 0.6 - 1.2 oz/week    1-2 Glasses of wine per week     Comment: 1-2 a year  . Drug Use: No  . Sexual Activity: Yes    Partners: Male    Birth Control/ Protection: None, Surgical     Comment: tubal    Other Topics Concern  . Not on file   Social History Narrative  . No narrative on file      Review of Systems  All other systems reviewed and are negative.      Objective:   Physical Exam  Vitals reviewed. Cardiovascular: Normal rate, regular rhythm and normal heart sounds.   No murmur heard. Pulmonary/Chest:  Effort normal and breath sounds normal. No respiratory distress. She has no wheezes. She has no rales. She exhibits no tenderness.  Abdominal: Soft. Bowel sounds are normal. She exhibits no distension. There is no tenderness. There is no rebound and no guarding.          Assessment & Plan:  Gastroesophageal reflux disease without esophagitis - Plan: pantoprazole (PROTONIX) 40 MG tablet  Abdominal pain, unspecified site  Based on the patient's history and labs, I believe the most likely explanation is irritable bowel syndrome complicated by gastroesophageal reflux disease.  Begin proton exporting milligrams by mouth daily for GERD. Begin a probiotic for bowel syndrome. Recheck in one month. The patient continues to have crampy abdominal pain and diarrhea, consult gastroenterology for endoscopy to rule out inflammatory bowel disease and also possibly gastroparesis.

## 2013-12-10 ENCOUNTER — Other Ambulatory Visit: Payer: Self-pay | Admitting: Family Medicine

## 2014-01-01 ENCOUNTER — Ambulatory Visit (INDEPENDENT_AMBULATORY_CARE_PROVIDER_SITE_OTHER): Payer: 59 | Admitting: *Deleted

## 2014-01-01 DIAGNOSIS — Z111 Encounter for screening for respiratory tuberculosis: Secondary | ICD-10-CM

## 2014-01-03 ENCOUNTER — Ambulatory Visit: Payer: 59 | Admitting: *Deleted

## 2014-01-03 DIAGNOSIS — Z111 Encounter for screening for respiratory tuberculosis: Secondary | ICD-10-CM

## 2014-01-03 LAB — TB SKIN TEST
INDURATION: 0 mm
TB Skin Test: NEGATIVE

## 2014-01-19 ENCOUNTER — Telehealth: Payer: Self-pay | Admitting: Family Medicine

## 2014-01-19 MED ORDER — ONETOUCH ULTRA SYSTEM W/DEVICE KIT: PACK | Status: DC

## 2014-01-19 NOTE — Telephone Encounter (Signed)
[phone] [facility]  Pt is wanting one touch meter and one touch strips she said something about lantus  She is wanting this because the insurance will not pay for what she does have

## 2014-01-19 NOTE — Telephone Encounter (Signed)
One touch sent to pharm with supplies. Pt aware

## 2014-01-29 ENCOUNTER — Encounter: Payer: Self-pay | Admitting: Family Medicine

## 2014-03-07 ENCOUNTER — Ambulatory Visit (INDEPENDENT_AMBULATORY_CARE_PROVIDER_SITE_OTHER): Payer: 59 | Admitting: Family Medicine

## 2014-03-07 ENCOUNTER — Encounter: Payer: Self-pay | Admitting: Family Medicine

## 2014-03-07 VITALS — BP 132/78 | HR 76 | Temp 99.0°F | Resp 12 | Wt 165.0 lb

## 2014-03-07 DIAGNOSIS — J0100 Acute maxillary sinusitis, unspecified: Secondary | ICD-10-CM

## 2014-03-07 DIAGNOSIS — E119 Type 2 diabetes mellitus without complications: Secondary | ICD-10-CM

## 2014-03-07 DIAGNOSIS — J209 Acute bronchitis, unspecified: Secondary | ICD-10-CM

## 2014-03-07 LAB — LIPID PANEL
Cholesterol: 196 mg/dL (ref 0–200)
HDL: 31 mg/dL — ABNORMAL LOW (ref >39)
LDL Cholesterol: 120 mg/dL — ABNORMAL HIGH (ref 0–99)
TRIGLYCERIDES: 226 mg/dL — AB (ref <150)
Total CHOL/HDL Ratio: 6.3 Ratio
VLDL: 45 mg/dL — AB (ref 0–40)

## 2014-03-07 LAB — BASIC METABOLIC PANEL
BUN: 11 mg/dL (ref 6–23)
CALCIUM: 9.5 mg/dL (ref 8.4–10.5)
CHLORIDE: 101 meq/L (ref 96–112)
CO2: 23 meq/L (ref 19–32)
Creat: 0.79 mg/dL (ref 0.50–1.10)
GLUCOSE: 253 mg/dL — AB (ref 70–99)
Potassium: 4 mEq/L (ref 3.5–5.3)
SODIUM: 136 meq/L (ref 135–145)

## 2014-03-07 LAB — HEMOGLOBIN A1C
Hgb A1c MFr Bld: 10.2 % — ABNORMAL HIGH (ref <5.7)
Mean Plasma Glucose: 246 mg/dL — ABNORMAL HIGH (ref <117)

## 2014-03-07 MED ORDER — PREDNISONE 20 MG PO TABS: 40.0000 mg | ORAL_TABLET | Freq: Every day | ORAL | Status: DC

## 2014-03-07 MED ORDER — FLUCONAZOLE 150 MG PO TABS: 150.0000 mg | ORAL_TABLET | Freq: Once | ORAL | Status: DC

## 2014-03-07 MED ORDER — AMOXICILLIN-POT CLAVULANATE 875-125 MG PO TABS: 1.0000 | ORAL_TABLET | Freq: Two times a day (BID) | ORAL | Status: DC

## 2014-03-07 NOTE — Patient Instructions (Signed)
Take antibiotics as prescribed Increase lantus to 40 units Prednisone for bronchitis, use inhaler Get flu shot F/U 2 months Dr. Dennard Schaumann for Diabetes

## 2014-03-07 NOTE — Progress Notes (Signed)
Patient ID: [name], female   DOB: 1976/02/03, 38 y.o.   MRN: [id number]   Subjective:    Patient ID: [name], female    DOB: 28-Jan-1976, 38 y.o.   MRN: [id number]  Patient presents for Sinus Infection  patient here with sinus pressure and drainage worsening of the past 3 weeks. She's also had some cough with wheezing has had to wake up in the melanotic T use her rescue inhaler for the past week. She has history of asthma and she continues to smoke. She has not had any recent fever no shortness of breath otherwise. She has taken some over-the-counter medication with minimal improvement. Also type 2 diabetes was uncontrolled her last A1c was back in May it was 9.2%. She is taking her Lantus and her NovoLog states she does not check her blood sugars on a regular basis since she started her new job about 2 months ago. When she last checked her blood sugar this week it was 220    Review Of Systems:  GEN- denies fatigue, fever, weight loss,weakness, recent illness HEENT- denies eye drainage, change in vision, +nasal discharge, CVS- denies chest pain, palpitations RESP- denies SOB, +cough, +wheeze ABD- denies N/V, change in stools, abd pain Neuro- denies headache, dizziness, syncope, seizure activity       Objective:    BP 132/78 mmHg  Pulse 76  Temp(Src) 99 F (37.2 C) (Oral)  Resp 12  Wt 165 lb (74.844 kg) GEN- NAD, alert and oriented x3 HEENT- PERRL, EOMI, non injected sclera, pink conjunctiva, MMM, oropharynx mild injection, TM clear bilat no effusion,  + maxillary sinus tenderness, inflammed turbinates,  Nasal drainage  Neck- Supple, no LAD CVS- RRR, no murmur RESP-scattered bilat wheeze- expiratory, normal WOB, no rales EXT- No edema Pulses- Radial 2+          Assessment & Plan:      Problem List Items Addressed This Visit      Unprioritized   Type II diabetes mellitus    Increase lantus to 40 units since steroids needed today Continue SSI Check A1C as  overdue    Relevant Orders      Lipid panel      Hemoglobin A1c      Basic metabolic panel    Other Visit Diagnoses    Acute maxillary sinusitis, recurrence not specified    -  Primary    Treat withh augmetnin based on duroation of symptoms, uncontrolled DM, mucinex    Relevant Medications       AMOXICILLIN-POT CLAVULANATE 875-125 MG PO TABS       fluconazole (DIFLUCAN) tablet 150 mg       predniSONE (DELTASONE) tablet    Acute bronchitis, unspecified organism        Prednisone given, history of asthma and smoker, has albuterol       Note: This dictation was prepared with Dragon dictation along with smaller phrase technology. Any transcriptional errors that result from this process are unintentional.

## 2014-03-07 NOTE — Assessment & Plan Note (Signed)
Increase lantus to 40 units since steroids needed today Continue SSI Check A1C as overdue

## 2014-03-20 ENCOUNTER — Encounter: Payer: Self-pay | Admitting: Family Medicine

## 2014-03-20 ENCOUNTER — Encounter: Payer: Self-pay | Admitting: *Deleted

## 2014-03-20 ENCOUNTER — Telehealth: Payer: Self-pay | Admitting: Family Medicine

## 2014-03-20 ENCOUNTER — Ambulatory Visit (INDEPENDENT_AMBULATORY_CARE_PROVIDER_SITE_OTHER): Payer: 59 | Admitting: Family Medicine

## 2014-03-20 VITALS — BP 136/80 | HR 120 | Temp 98.2°F | Resp 22 | Ht 63.0 in | Wt 165.0 lb

## 2014-03-20 DIAGNOSIS — R000 Tachycardia, unspecified: Secondary | ICD-10-CM

## 2014-03-20 DIAGNOSIS — H539 Unspecified visual disturbance: Secondary | ICD-10-CM

## 2014-03-20 MED ORDER — SPRINTEC 28 0.25-35 MG-MCG PO TABS: 1.0000 | ORAL_TABLET | Freq: Every day | ORAL | Status: DC

## 2014-03-20 MED ORDER — NORGESTIMATE-ETH ESTRADIOL 0.25-35 MG-MCG PO TABS: 1.0000 | ORAL_TABLET | Freq: Every day | ORAL | Status: DC

## 2014-03-20 NOTE — Progress Notes (Signed)
Subjective:    Patient ID: [name], female    DOB: 01/10/1976, 38 y.o.   MRN: [id number]  HPI 2 days ago, the patient suddenly developed black vertical lines in her left eye. Shortly thereafter her entire vision became red and then black. Since that time she has extremely blurry vision in her left eye. She is unable to discern shapes. There is just white and red color. She also has blurry vision in her right eye which has been chronic. She is able to see the upper hemisphere of her vision in the right eye..  She also has a dull headache and some stiffness in her neck. She denies any fevers or chills. She denies any pulsatile headache. She denies any photophobia or phonophobia. She denies any nausea or vomiting. She denies any head trauma or concussion. She does smoke and she does have poorly controlled diabetes along with hypertension. Past Medical History  Diagnosis Date  . Diabetes mellitus     19 years ago  . Muscle spasms of lower extremity     legs and weakness  . Hypercholesteremia   . Asthma    Past Surgical History  Procedure Laterality Date  . Tubal ligation  2002  . Dilation and curettage of uterus  1997  . Wisdom tooth extraction      age 37  . Cesarean section  1993, 1998, 2002   Current Outpatient Prescriptions on File Prior to Visit  Medication Sig Dispense Refill  . amLODipine (NORVASC) 10 MG tablet Take 1 tablet (10 mg total) by mouth daily. 90 tablet 3  . BD PEN NEEDLE NANO U/F 32G X 4 MM MISC USE AS DIRECTED 3 TIMES A DAY 100 each 9  . Blood Glucose Monitoring Suppl (ONE TOUCH ULTRA SYSTEM KIT) W/DEVICE KIT Also needs Lancets #200/11 refills and Strips #200/ 11 refills - Checks BS 4-5 times daily - DX E11.9 1 each 0  . budesonide-formoterol (SYMBICORT) 160-4.5 MCG/ACT inhaler Inhale 2 puffs into the lungs 2 (two) times daily. 1 Inhaler 3  . fluconazole (DIFLUCAN) 150 MG tablet Take 1 tablet (150 mg total) by mouth once. 1 tablet 0  . gabapentin (NEURONTIN) 300  MG capsule Take 300 mg by mouth 2 (two) times daily.     . Insulin Glargine (LANTUS SOLOSTAR) 100 UNIT/ML Solostar Pen INJECT 38 UNITS SUBCUTANEOUSLY DAILY (Patient taking differently: INJECT 40  UNITS SUBCUTANEOUSLY DAILY) 15 pen 3  . losartan (COZAAR) 100 MG tablet TAKE 1 TABLET EVERY DAY 30 tablet 11  . Multiple Vitamin (MULTIVITAMIN) tablet Take 1 tablet by mouth daily.      Marland Kitchen NOVOLOG FLEXPEN 100 UNIT/ML FlexPen INJECT 7 UNITS INTO THE SKIN 3 (THREE) TIMES DAILY WITH MEALS. 15 pen 6  . pantoprazole (PROTONIX) 40 MG tablet Take 1 tablet (40 mg total) by mouth daily. 30 tablet 3  . PROAIR HFA 108 (90 BASE) MCG/ACT inhaler 2 PUFFS EVERY 6 HOURS AS NEEDED 8.5 each 3   No current facility-administered medications on file prior to visit.   Allergies  Allergen Reactions  . Demerol Itching and Rash  . Morphine And Related Itching and Rash       . Zocor [Simvastatin] Itching and Other (See Comments)    Leg pain   History   Social History  . Marital Status: Single    Spouse Name: N/A    Number of Children: N/A  . Years of Education: N/A   Occupational History  . Not on file.   Social  History Main Topics  . Smoking status: Current Every Day Smoker -- 1.00 packs/day for 6 years    Types: Cigarettes  . Smokeless tobacco: Never Used  . Alcohol Use: 0.6 - 1.2 oz/week    1-2 Glasses of wine per week     Comment: 1-2 a year  . Drug Use: No  . Sexual Activity:    Partners: Male    Birth Control/ Protection: None, Surgical     Comment: tubal    Other Topics Concern  . Not on file   Social History Narrative      Review of Systems  All other systems reviewed and are negative.      Objective:   Physical Exam  Constitutional: She is oriented to person, place, and time.  Eyes: Conjunctivae and EOM are normal. Pupils are equal, round, and reactive to light. Right eye exhibits no discharge. Left eye exhibits no discharge. No scleral icterus.  Cardiovascular: Regular rhythm and  normal heart sounds.   Pulmonary/Chest: Effort normal and breath sounds normal.  Neurological: She is alert and oriented to person, place, and time. She has normal reflexes. She displays normal reflexes. No cranial nerve deficit. She exhibits normal muscle tone. Coordination normal.  Vitals reviewed. On funduscopic exam, the right retina shows no papilledema or retinopathy. I'm unable to get a clear view of the left retina. On funduscopic exam there is a blurry red haze but I'm unable to clarify.        Assessment & Plan:  Monocular visual disturbance - Plan: CBC with Differential, TSH, COMPLETE METABOLIC PANEL WITH GFR  Tachycardia  Differential diagnosis includes embolic stroke to the left eye, retinal hemorrhage, retinal tear, retinal detachment, ocular migraine. Patient was referred urgently to a retinal specialist. The retinal specialist will occur in first thing tomorrow morning. If retinal exam reveals no pathology, we will begin a thorough diagnostic workup for embolic sources of stroke including carotid Dopplers, MRI of the brain, echocardiogram of the heart. I will check a sedimentation rate today to evaluate for temporal arteritis although this is unlikely given her age. I counseled smoking cessation. I will start the patient on Bystolic 5 mg by mouth daily for tachycardia. I will await the results of her eye exam. 

## 2014-03-20 NOTE — Telephone Encounter (Signed)
Pharmacy says insurance does not pay for generic birth control.  Order sent for name brand only "DAW" Sprintec

## 2014-03-21 ENCOUNTER — Telehealth: Payer: Self-pay | Admitting: *Deleted

## 2014-03-21 ENCOUNTER — Encounter (INDEPENDENT_AMBULATORY_CARE_PROVIDER_SITE_OTHER): Payer: 59 | Admitting: Ophthalmology

## 2014-03-21 DIAGNOSIS — I10 Essential (primary) hypertension: Secondary | ICD-10-CM

## 2014-03-21 DIAGNOSIS — H35033 Hypertensive retinopathy, bilateral: Secondary | ICD-10-CM

## 2014-03-21 DIAGNOSIS — E10351 Type 1 diabetes mellitus with proliferative diabetic retinopathy with macular edema: Secondary | ICD-10-CM

## 2014-03-21 DIAGNOSIS — H43813 Vitreous degeneration, bilateral: Secondary | ICD-10-CM

## 2014-03-21 DIAGNOSIS — E10311 Type 1 diabetes mellitus with unspecified diabetic retinopathy with macular edema: Secondary | ICD-10-CM

## 2014-03-21 DIAGNOSIS — H43812 Vitreous degeneration, left eye: Secondary | ICD-10-CM

## 2014-03-21 LAB — HM DIABETES EYE EXAM

## 2014-03-21 NOTE — Telephone Encounter (Signed)
Lisa from Dr. Tempie Hoist office stating that you wanted them to call them back after seeing the pt. Please call them back at [phone] talk to Dr. Zigmund Daniel

## 2014-03-26 NOTE — Telephone Encounter (Signed)
Patient had vitreous hemorrrhage in left eye and Dr. Zigmund Daniel used laser that day with follow up in 1 month.  I discussed case with him today and he recommends no vascular work up for embolic stroke.  She needs to see me to better control her diabetes.

## 2014-03-29 NOTE — Telephone Encounter (Signed)
LMTRC

## 2014-04-04 ENCOUNTER — Other Ambulatory Visit: Payer: Self-pay | Admitting: Family Medicine

## 2014-04-04 NOTE — Telephone Encounter (Signed)
Medication refilled per protocol. 

## 2014-04-06 NOTE — Telephone Encounter (Signed)
Tried calling pt again lmtrc

## 2014-04-09 ENCOUNTER — Encounter (INDEPENDENT_AMBULATORY_CARE_PROVIDER_SITE_OTHER): Payer: 59 | Admitting: Ophthalmology

## 2014-04-09 DIAGNOSIS — E11351 Type 2 diabetes mellitus with proliferative diabetic retinopathy with macular edema: Secondary | ICD-10-CM

## 2014-04-09 DIAGNOSIS — E11311 Type 2 diabetes mellitus with unspecified diabetic retinopathy with macular edema: Secondary | ICD-10-CM

## 2014-04-12 ENCOUNTER — Encounter: Payer: Self-pay | Admitting: Family Medicine

## 2014-04-16 NOTE — Telephone Encounter (Signed)
Pt has appt 05/08/14

## 2014-04-23 ENCOUNTER — Encounter: Payer: Self-pay | Admitting: Family Medicine

## 2014-04-23 ENCOUNTER — Ambulatory Visit (INDEPENDENT_AMBULATORY_CARE_PROVIDER_SITE_OTHER): Payer: 59 | Admitting: Family Medicine

## 2014-04-23 VITALS — BP 126/74 | HR 100 | Temp 98.7°F | Resp 16 | Ht 63.0 in | Wt 171.0 lb

## 2014-04-23 DIAGNOSIS — E119 Type 2 diabetes mellitus without complications: Secondary | ICD-10-CM

## 2014-04-23 DIAGNOSIS — J441 Chronic obstructive pulmonary disease with (acute) exacerbation: Secondary | ICD-10-CM

## 2014-04-23 DIAGNOSIS — IMO0001 Reserved for inherently not codable concepts without codable children: Secondary | ICD-10-CM

## 2014-04-23 DIAGNOSIS — Z794 Long term (current) use of insulin: Secondary | ICD-10-CM

## 2014-04-23 MED ORDER — LEVOFLOXACIN 500 MG PO TABS
500.0000 mg | ORAL_TABLET | Freq: Every day | ORAL | Status: DC
Start: 2014-04-23 — End: 2014-05-08

## 2014-04-23 NOTE — Progress Notes (Signed)
Subjective:    Patient ID: [name], female    DOB: September 06, 1975, 39 y.o.   MRN: [id number]  HPI Please see my last office visit. The patient experienced monocular vision loss due to retinal hemorrhage secondary to her diabetes. She has underwent several surgeries to the ophthalmologist and has recovered some of the vision in her left eye. Unfortunately her diabetes remains uncontrolled. She is currently taking Levemir 38 units once daily and 7 units of NovoLog with meals according to her. Her most recent hemoglobin A1c was well above 10. I doubt that she is truly checking her sugars as often as she says that she is adamant that her fasting blood sugars are typically 160 and her 2 hour postprandial sugars are usually around 200. The main reason she is here today is because she has had a cough and wheezing and a productive cough now for more than 6 weeks. She was seen in December and treated for sinus infection but the cough continues to linger. Unfortunate she smokes and she also has underlying asthma. She's been using her Symbicort once daily. She denies any purulent mucus. She denies any fevers or chills. She does report some shortness of breath and wheezing. Past Medical History  Diagnosis Date  . Diabetes mellitus     19 years ago  . Muscle spasms of lower extremity     legs and weakness  . Hypercholesteremia   . Asthma    Past Surgical History  Procedure Laterality Date  . Tubal ligation  2002  . Dilation and curettage of uterus  1997  . Wisdom tooth extraction      age 51  . Cesarean section  1993, 1998, 2002   Current Outpatient Prescriptions on File Prior to Visit  Medication Sig Dispense Refill  . amLODipine (NORVASC) 10 MG tablet Take 1 tablet (10 mg total) by mouth daily. 90 tablet 3  . BD PEN NEEDLE NANO U/F 32G X 4 MM MISC USE AS DIRECTED 3 TIMES A DAY 100 each 9  . Blood Glucose Monitoring Suppl (ONE TOUCH ULTRA SYSTEM KIT) W/DEVICE KIT Also needs Lancets #200/11 refills  and Strips #200/ 11 refills - Checks BS 4-5 times daily - DX E11.9 1 each 0  . budesonide-formoterol (SYMBICORT) 160-4.5 MCG/ACT inhaler Inhale 2 puffs into the lungs 2 (two) times daily. 1 Inhaler 3  . gabapentin (NEURONTIN) 300 MG capsule Take 300 mg by mouth 2 (two) times daily.     . Insulin Glargine (LANTUS SOLOSTAR) 100 UNIT/ML Solostar Pen INJECT 38 UNITS SUBCUTANEOUSLY DAILY (Patient taking differently: INJECT 40  UNITS SUBCUTANEOUSLY DAILY) 15 pen 3  . losartan (COZAAR) 100 MG tablet TAKE 1 TABLET EVERY DAY 30 tablet 11  . Multiple Vitamin (MULTIVITAMIN) tablet Take 1 tablet by mouth daily.      Marland Kitchen NOVOLOG FLEXPEN 100 UNIT/ML FlexPen INJECT 7 UNITS INTO THE SKIN 3 (THREE) TIMES DAILY WITH MEALS. 15 pen 6  . pantoprazole (PROTONIX) 40 MG tablet TAKE ONE TABLET BY MOUTH DAILY 30 tablet 5  . PROAIR HFA 108 (90 BASE) MCG/ACT inhaler 2 PUFFS EVERY 6 HOURS AS NEEDED 8.5 each 3  . SPRINTEC 28 0.25-35 MG-MCG tablet Take 1 tablet by mouth daily. 1 Package 11   No current facility-administered medications on file prior to visit.   Allergies  Allergen Reactions  . Demerol Itching and Rash  . Morphine And Related Itching and Rash       . Zocor [Simvastatin] Itching and Other (See Comments)  Leg pain   History   Social History  . Marital Status: Single    Spouse Name: N/A    Number of Children: N/A  . Years of Education: N/A   Occupational History  . Not on file.   Social History Main Topics  . Smoking status: Current Every Day Smoker -- 1.00 packs/day for 6 years    Types: Cigarettes  . Smokeless tobacco: Never Used  . Alcohol Use: 0.6 - 1.2 oz/week    1-2 Glasses of wine per week     Comment: 1-2 a year  . Drug Use: No  . Sexual Activity:    Partners: Male    Birth Control/ Protection: None, Surgical     Comment: tubal    Other Topics Concern  . Not on file   Social History Narrative      Review of Systems  All other systems reviewed and are negative.        Objective:   Physical Exam  Constitutional: She appears well-developed and well-nourished.  HENT:  Right Ear: External ear normal.  Left Ear: External ear normal.  Nose: Nose normal.  Mouth/Throat: Oropharynx is clear and moist. No oropharyngeal exudate.  Cardiovascular: Normal rate, regular rhythm and normal heart sounds.   Pulmonary/Chest: Effort normal. No respiratory distress. She has wheezes. She has no rales. She exhibits no tenderness.  Abdominal: Soft. Bowel sounds are normal.  Vitals reviewed.         Assessment & Plan:  Bronchitis, chronic obstructive, with exacerbation - Plan: levofloxacin (LEVAQUIN) 500 MG tablet  IDDM (insulin dependent diabetes mellitus)  Patient has chronic bronchitis and I believe this is an evidence that she is developing COPD in addition to her asthma. I recommended her Symbicort twice daily as prescribed. Also strongly admonished her for smoking a recommended smoking cessation. I will also add Levaquin 500 mg by mouth daily for 7 days. Also recommended she increase her Levemir to 48 units subcutaneous daily and 7 units of NovoLog with meals. Recheck fasting blood sugars and two-hour postprandial sugars in one week so I can further titrate her insulin.

## 2014-04-27 ENCOUNTER — Telehealth: Payer: Self-pay | Admitting: Family Medicine

## 2014-04-27 NOTE — Telephone Encounter (Signed)
Patient would like a call back regarding her balance on account  [phone]

## 2014-04-30 NOTE — Telephone Encounter (Signed)
Phone rang once and I was disconnected.

## 2014-05-08 ENCOUNTER — Ambulatory Visit (INDEPENDENT_AMBULATORY_CARE_PROVIDER_SITE_OTHER): Payer: 59 | Admitting: Family Medicine

## 2014-05-08 ENCOUNTER — Encounter: Payer: Self-pay | Admitting: Family Medicine

## 2014-05-08 VITALS — BP 112/70 | HR 118 | Temp 98.4°F | Resp 18 | Ht 63.0 in | Wt 176.0 lb

## 2014-05-08 DIAGNOSIS — R000 Tachycardia, unspecified: Secondary | ICD-10-CM

## 2014-05-08 DIAGNOSIS — IMO0001 Reserved for inherently not codable concepts without codable children: Secondary | ICD-10-CM

## 2014-05-08 DIAGNOSIS — I471 Supraventricular tachycardia: Secondary | ICD-10-CM

## 2014-05-08 DIAGNOSIS — E119 Type 2 diabetes mellitus without complications: Secondary | ICD-10-CM

## 2014-05-08 DIAGNOSIS — Z794 Long term (current) use of insulin: Secondary | ICD-10-CM

## 2014-05-08 LAB — CBC WITH DIFFERENTIAL/PLATELET
BASOS ABS: 0 10*3/uL (ref 0.0–0.1)
Basophils Relative: 0 % (ref 0–1)
Eosinophils Absolute: 0.1 10*3/uL (ref 0.0–0.7)
Eosinophils Relative: 1 % (ref 0–5)
HCT: 43 % (ref 36.0–46.0)
Hemoglobin: 14.6 g/dL (ref 12.0–15.0)
LYMPHS PCT: 36 % (ref 12–46)
Lymphs Abs: 3.3 10*3/uL (ref 0.7–4.0)
MCH: 30.5 pg (ref 26.0–34.0)
MCHC: 34 g/dL (ref 30.0–36.0)
MCV: 89.8 fL (ref 78.0–100.0)
MPV: 10.2 fL (ref 8.6–12.4)
Monocytes Absolute: 0.5 10*3/uL (ref 0.1–1.0)
Monocytes Relative: 5 % (ref 3–12)
Neutro Abs: 5.3 10*3/uL (ref 1.7–7.7)
Neutrophils Relative %: 58 % (ref 43–77)
PLATELETS: 286 10*3/uL (ref 150–400)
RBC: 4.79 MIL/uL (ref 3.87–5.11)
RDW: 12.9 % (ref 11.5–15.5)
WBC: 9.1 10*3/uL (ref 4.0–10.5)

## 2014-05-08 LAB — TSH: TSH: 1.27 u[IU]/mL (ref 0.350–4.500)

## 2014-05-08 NOTE — Progress Notes (Signed)
Subjective:    Patient ID: [name], female    DOB: 1975/08/18, 39 y.o.   MRN: [id number]  Diabetes  04/23/14 Please see my last office visit. The patient experienced monocular vision loss due to retinal hemorrhage secondary to her diabetes. She has underwent several surgeries to the ophthalmologist and has recovered some of the vision in her left eye. Unfortunately her diabetes remains uncontrolled. She is currently taking Levemir 38 units once daily and 7 units of NovoLog with meals according to her. Her most recent hemoglobin A1c was well above 10. I doubt that she is truly checking her sugars as often as she says that she is adamant that her fasting blood sugars are typically 160 and her 2 hour postprandial sugars are usually around 200. The main reason she is here today is because she has had a cough and wheezing and a productive cough now for more than 6 weeks. She was seen in December and treated for sinus infection but the cough continues to linger. Unfortunate she smokes and she also has underlying asthma. She's been using her Symbicort once daily. She denies any purulent mucus. She denies any fevers or chills. She does report some shortness of breath and wheezing.  At that time, my plan was: Patient has chronic bronchitis and I believe this is an evidence that she is developing COPD in addition to her asthma. I recommended her Symbicort twice daily as prescribed. Also strongly admonished her for smoking a recommended smoking cessation. I will also add Levaquin 500 mg by mouth daily for 7 days. Also recommended she increase her Levemir to 48 units subcutaneous daily and 7 units of NovoLog with meals. Recheck fasting blood sugars and two-hour postprandial sugars in one week so I can further titrate her insulin.  05/08/14 Patient is here today for follow-up. She has been very good about checking her blood sugars over the last week. Her fasting blood sugars typically range 130-150. Her two-hour  postprandial sugars typically range between 180 and 220-230. She is having no episodes of hypoglycemia no 48 units of Levemir in the morning and 7 units of NovoLog with meals in addition to the sliding scale correction.  Therefore typically she is giving herself anywhere between 7 units and 12 units with meals. Surprisingly her heart rate remains elevated at 118. She denies any anxiety. She has not used her inhalers this morning. She denies excessive caffeine use. She denies rapid weight loss fevers or hot flashes. She denies any bleeding or bruising. Past Medical History  Diagnosis Date  . Diabetes mellitus     19 years ago  . Muscle spasms of lower extremity     legs and weakness  . Hypercholesteremia   . Asthma    Past Surgical History  Procedure Laterality Date  . Tubal ligation  2002  . Dilation and curettage of uterus  1997  . Wisdom tooth extraction      age 36  . Cesarean section  1993, 1998, 2002   Current Outpatient Prescriptions on File Prior to Visit  Medication Sig Dispense Refill  . amLODipine (NORVASC) 10 MG tablet Take 1 tablet (10 mg total) by mouth daily. 90 tablet 3  . BD PEN NEEDLE NANO U/F 32G X 4 MM MISC USE AS DIRECTED 3 TIMES A DAY 100 each 9  . Blood Glucose Monitoring Suppl (ONE TOUCH ULTRA SYSTEM KIT) W/DEVICE KIT Also needs Lancets #200/11 refills and Strips #200/ 11 refills - Checks BS 4-5 times daily -  DX E11.9 1 each 0  . budesonide-formoterol (SYMBICORT) 160-4.5 MCG/ACT inhaler Inhale 2 puffs into the lungs 2 (two) times daily. 1 Inhaler 3  . gabapentin (NEURONTIN) 300 MG capsule Take 300 mg by mouth 2 (two) times daily.     . Insulin Glargine (LANTUS SOLOSTAR) 100 UNIT/ML Solostar Pen INJECT 38 UNITS SUBCUTANEOUSLY DAILY (Patient taking differently: Inject 48 Units into the skin. ) 15 pen 3  . losartan (COZAAR) 100 MG tablet TAKE 1 TABLET EVERY DAY 30 tablet 11  . Multiple Vitamin (MULTIVITAMIN) tablet Take 1 tablet by mouth daily.      Marland Kitchen NOVOLOG FLEXPEN  100 UNIT/ML FlexPen INJECT 7 UNITS INTO THE SKIN 3 (THREE) TIMES DAILY WITH MEALS. (Patient taking differently: INJECT 7 UNITS INTO THE SKIN 3 (THREE) TIMES DAILY WITH MEALS. (Sliding Scale)) 15 pen 6  . pantoprazole (PROTONIX) 40 MG tablet TAKE ONE TABLET BY MOUTH DAILY 30 tablet 5  . PROAIR HFA 108 (90 BASE) MCG/ACT inhaler 2 PUFFS EVERY 6 HOURS AS NEEDED 8.5 each 3   No current facility-administered medications on file prior to visit.   Allergies  Allergen Reactions  . Demerol Itching and Rash  . Morphine And Related Itching and Rash       . Zocor [Simvastatin] Itching and Other (See Comments)    Leg pain   History   Social History  . Marital Status: Single    Spouse Name: N/A    Number of Children: N/A  . Years of Education: N/A   Occupational History  . Not on file.   Social History Main Topics  . Smoking status: Current Every Day Smoker -- 1.00 packs/day for 6 years    Types: Cigarettes  . Smokeless tobacco: Never Used  . Alcohol Use: 0.6 - 1.2 oz/week    1-2 Glasses of wine per week     Comment: 1-2 a year  . Drug Use: No  . Sexual Activity:    Partners: Male    Birth Control/ Protection: None, Surgical     Comment: tubal    Other Topics Concern  . Not on file   Social History Narrative      Review of Systems  All other systems reviewed and are negative.      Objective:   Physical Exam  Constitutional: She appears well-developed and well-nourished.  HENT:  Right Ear: External ear normal.  Left Ear: External ear normal.  Nose: Nose normal.  Mouth/Throat: Oropharynx is clear and moist. No oropharyngeal exudate.  Cardiovascular: Normal rate, regular rhythm and normal heart sounds.   Pulmonary/Chest: Effort normal. No respiratory distress. She has wheezes. She has no rales. She exhibits no tenderness.  Abdominal: Soft. Bowel sounds are normal.  Vitals reviewed.         Assessment & Plan:  Sinus tachycardia - Plan: CBC with  Differential/Platelet, TSH  IDDM (insulin dependent diabetes mellitus)  Sinus tachycardia could certainly be due to adrenergic excess related to her breathing condition. However I will check a CBC to monitor for anemia. The patient does not appear to be clinically dehydrated. I will check a TSH to check for hyperthyroidism. If the lab work is normal, I will consider adding a cardiac specific beta blocker to try to help regulate her heart rate. We will need to monitor her breathing closely. I have asked the patient to increase her Levemir to 58 units in the morning. Continue 7 units of NovoLog with meals plus the correction factor. Recheck sugars in one week  and further titrate. I anticipate at that time her fasting blood sugars will be normal. We may then have to increase the amount of mealtime insulin she is taking depending upon how resistant to insulin she is.

## 2014-05-10 NOTE — Telephone Encounter (Signed)
2.2.16 I spoke with Christia Reading husband of patient. I documented on patients snapshot of the conversation about her past due amount.

## 2014-06-05 ENCOUNTER — Encounter: Payer: Self-pay | Admitting: Family Medicine

## 2014-07-03 ENCOUNTER — Ambulatory Visit (INDEPENDENT_AMBULATORY_CARE_PROVIDER_SITE_OTHER): Payer: 59 | Admitting: Family Medicine

## 2014-07-03 ENCOUNTER — Encounter: Payer: Self-pay | Admitting: Family Medicine

## 2014-07-03 VITALS — BP 120/75 | HR 102 | Temp 98.6°F | Resp 16 | Ht 63.0 in | Wt 174.0 lb

## 2014-07-03 DIAGNOSIS — I471 Supraventricular tachycardia: Secondary | ICD-10-CM | POA: Diagnosis not present

## 2014-07-03 DIAGNOSIS — Z794 Long term (current) use of insulin: Secondary | ICD-10-CM

## 2014-07-03 DIAGNOSIS — R000 Tachycardia, unspecified: Secondary | ICD-10-CM

## 2014-07-03 DIAGNOSIS — E119 Type 2 diabetes mellitus without complications: Secondary | ICD-10-CM | POA: Diagnosis not present

## 2014-07-03 DIAGNOSIS — IMO0001 Reserved for inherently not codable concepts without codable children: Secondary | ICD-10-CM

## 2014-07-03 LAB — HEMOGLOBIN A1C
HEMOGLOBIN A1C: 6.7 % — AB (ref <5.7)
Mean Plasma Glucose: 146 mg/dL — ABNORMAL HIGH (ref <117)

## 2014-07-03 LAB — COMPLETE METABOLIC PANEL WITH GFR
ALK PHOS: 58 U/L (ref 39–117)
ALT: 25 U/L (ref 0–35)
AST: 23 U/L (ref 0–37)
Albumin: 4.5 g/dL (ref 3.5–5.2)
BILIRUBIN TOTAL: 0.4 mg/dL (ref 0.2–1.2)
BUN: 11 mg/dL (ref 6–23)
CO2: 26 mEq/L (ref 19–32)
Calcium: 9.9 mg/dL (ref 8.4–10.5)
Chloride: 103 mEq/L (ref 96–112)
Creat: 0.68 mg/dL (ref 0.50–1.10)
GFR, Est African American: >89 mL/min
GFR, Est Non African American: >89 mL/min
Glucose, Bld: 119 mg/dL — ABNORMAL HIGH (ref 70–99)
Potassium: 4 mEq/L (ref 3.5–5.3)
SODIUM: 138 meq/L (ref 135–145)
Total Protein: 7.2 g/dL (ref 6.0–8.3)

## 2014-07-03 MED ORDER — CARVEDILOL 6.25 MG PO TABS: 6.2500 mg | ORAL_TABLET | Freq: Two times a day (BID) | ORAL | Status: DC

## 2014-07-03 NOTE — Progress Notes (Signed)
Subjective:    Patient ID: [name], female    DOB: 11-21-1975, 39 y.o.   MRN: [id number]  HPI 04/23/14 Please see my last office visit. The patient experienced monocular vision loss due to retinal hemorrhage secondary to her diabetes. She has underwent several surgeries to the ophthalmologist and has recovered some of the vision in her left eye. Unfortunately her diabetes remains uncontrolled. She is currently taking Levemir 38 units once daily and 7 units of NovoLog with meals according to her. Her most recent hemoglobin A1c was well above 10. I doubt that she is truly checking her sugars as often as she says that she is adamant that her fasting blood sugars are typically 160 and her 2 hour postprandial sugars are usually around 200. The main reason she is here today is because she has had a cough and wheezing and a productive cough now for more than 6 weeks. She was seen in December and treated for sinus infection but the cough continues to linger. Unfortunate she smokes and she also has underlying asthma. She's been using her Symbicort once daily. She denies any purulent mucus. She denies any fevers or chills. She does report some shortness of breath and wheezing.  At that time, my plan was: Patient has chronic bronchitis and I believe this is an evidence that she is developing COPD in addition to her asthma. I recommended her Symbicort twice daily as prescribed. Also strongly admonished her for smoking a recommended smoking cessation. I will also add Levaquin 500 mg by mouth daily for 7 days. Also recommended she increase her Levemir to 48 units subcutaneous daily and 7 units of NovoLog with meals. Recheck fasting blood sugars and two-hour postprandial sugars in one week so I can further titrate her insulin.  05/08/14 Patient is here today for follow-up. She has been very good about checking her blood sugars over the last week. Her fasting blood sugars typically range 130-150. Her two-hour  postprandial sugars typically range between 180 and 220-230. She is having no episodes of hypoglycemia no 48 units of Levemir in the morning and 7 units of NovoLog with meals in addition to the sliding scale correction.  Therefore typically she is giving herself anywhere between 7 units and 12 units with meals. Surprisingly her heart rate remains elevated at 118. She denies any anxiety. She has not used her inhalers this morning. She denies excessive caffeine use. She denies rapid weight loss fevers or hot flashes. She denies any bleeding or bruising.  At that time, my plan was:  Sinus tachycardia could certainly be due to adrenergic excess related to her breathing condition. However I will check a CBC to monitor for anemia. The patient does not appear to be clinically dehydrated. I will check a TSH to check for hyperthyroidism. If the lab work is normal, I will consider adding a cardiac specific beta blocker to try to help regulate her heart rate. We will need to monitor her breathing closely. I have asked the patient to increase her Levemir to 58 units in the morning. Continue 7 units of NovoLog with meals plus the correction factor. Recheck sugars in one week and further titrate. I anticipate at that time her fasting blood sugars will be normal. We may then have to increase the amount of mealtime insulin she is taking depending upon how resistant to insulin she is.  07/03/14 Patient presents today area she's been checking her blood pressure and her heart rate recently. Her heart rate is  consistently 100-130 even in the morning. She is not using albuterol. Her CBC and TSH were normal. Today she is in sinus tachycardia at 100 beats per minute on examination. She does have a history of asthma and chronic tobacco abuse. We have been hesitant to start the patient on a beta blocker for this reason but her heart rate gives [place] little choice. Currently the patient is on 70 units of Levemir every morning. She is not  taking the NovoLog as we prescribed it. Instead she is taking anywhere from 3-8 units with meals depending upon what she is eating. Basically she eyeballs it. Basically her fasting blood sugars are less than 120 and her two-hour postprandial sugars are under 180 according to her. Past Medical History  Diagnosis Date  . Diabetes mellitus     19 years ago  . Muscle spasms of lower extremity     legs and weakness  . Hypercholesteremia   . Asthma    Past Surgical History  Procedure Laterality Date  . Tubal ligation  2002  . Dilation and curettage of uterus  1997  . Wisdom tooth extraction      age 53  . Cesarean section  1993, 1998, 2002   Current Outpatient Prescriptions on File Prior to Visit  Medication Sig Dispense Refill  . amLODipine (NORVASC) 10 MG tablet Take 1 tablet (10 mg total) by mouth daily. 90 tablet 3  . BD PEN NEEDLE NANO U/F 32G X 4 MM MISC USE AS DIRECTED 3 TIMES A DAY 100 each 9  . Blood Glucose Monitoring Suppl (ONE TOUCH ULTRA SYSTEM KIT) W/DEVICE KIT Also needs Lancets #200/11 refills and Strips #200/ 11 refills - Checks BS 4-5 times daily - DX E11.9 1 each 0  . budesonide-formoterol (SYMBICORT) 160-4.5 MCG/ACT inhaler Inhale 2 puffs into the lungs 2 (two) times daily. 1 Inhaler 3  . gabapentin (NEURONTIN) 300 MG capsule Take 300 mg by mouth 2 (two) times daily.     . Insulin Glargine (LANTUS SOLOSTAR) 100 UNIT/ML Solostar Pen INJECT 38 UNITS SUBCUTANEOUSLY DAILY (Patient taking differently: Inject 70 Units into the skin. ) 15 pen 3  . losartan (COZAAR) 100 MG tablet TAKE 1 TABLET EVERY DAY 30 tablet 11  . Multiple Vitamin (MULTIVITAMIN) tablet Take 1 tablet by mouth daily.      Marland Kitchen NOVOLOG FLEXPEN 100 UNIT/ML FlexPen INJECT 7 UNITS INTO THE SKIN 3 (THREE) TIMES DAILY WITH MEALS. (Patient taking differently: INJECT 3-8 UNITS INTO THE SKIN 3 (THREE) TIMES DAILY WITH MEALS. (Sliding Scale)) 15 pen 6  . pantoprazole (PROTONIX) 40 MG tablet TAKE ONE TABLET BY MOUTH DAILY 30  tablet 5  . PROAIR HFA 108 (90 BASE) MCG/ACT inhaler 2 PUFFS EVERY 6 HOURS AS NEEDED 8.5 each 3   No current facility-administered medications on file prior to visit.   Allergies  Allergen Reactions  . Demerol Itching and Rash  . Morphine And Related Itching and Rash       . Zocor [Simvastatin] Itching and Other (See Comments)    Leg pain   History   Social History  . Marital Status: Single    Spouse Name: N/A  . Number of Children: N/A  . Years of Education: N/A   Occupational History  . Not on file.   Social History Main Topics  . Smoking status: Current Every Day Smoker -- 1.00 packs/day for 6 years    Types: Cigarettes  . Smokeless tobacco: Never Used  . Alcohol Use: 0.6 - 1.2  oz/week    1-2 Glasses of wine per week     Comment: 1-2 a year  . Drug Use: No  . Sexual Activity:    Partners: Male    Birth Control/ Protection: None, Surgical     Comment: tubal    Other Topics Concern  . Not on file   Social History Narrative      Review of Systems  All other systems reviewed and are negative.      Objective:   Physical Exam  Constitutional: She appears well-developed and well-nourished.  HENT:  Right Ear: External ear normal.  Left Ear: External ear normal.  Nose: Nose normal.  Mouth/Throat: Oropharynx is clear and moist. No oropharyngeal exudate.  Cardiovascular: Normal rate, regular rhythm and normal heart sounds.   Pulmonary/Chest: Effort normal. No respiratory distress. She has wheezes. She has no rales. She exhibits no tenderness.  Abdominal: Soft. Bowel sounds are normal.  Vitals reviewed.         Assessment & Plan:  Sinus tachycardia - Plan: carvedilol (COREG) 6.25 MG tablet  IDDM (insulin dependent diabetes mellitus) - Plan: COMPLETE METABOLIC PANEL WITH GFR, Hemoglobin A1c  Start Coreg 6.25 mg by mouth twice a day. Recheck heart rate and blood pressure in 2 weeks and titrate medication to effect. Monitor for bronchospasms on  carvedilol. Continue Levemir 70 units daily. Currently her blood sugars that she is reporting sound excellent. Therefore I will not make any changes in her insulin dosage.

## 2014-07-10 ENCOUNTER — Other Ambulatory Visit: Payer: Self-pay | Admitting: Family Medicine

## 2014-07-10 DIAGNOSIS — R000 Tachycardia, unspecified: Secondary | ICD-10-CM

## 2014-07-10 MED ORDER — INSULIN GLARGINE 100 UNIT/ML SOLOSTAR PEN: 70.0000 [IU] | PEN_INJECTOR | Freq: Every day | SUBCUTANEOUS | Status: DC

## 2014-07-10 MED ORDER — AMLODIPINE BESYLATE 10 MG PO TABS: 10.0000 mg | ORAL_TABLET | Freq: Every day | ORAL | Status: DC

## 2014-07-10 MED ORDER — LOSARTAN POTASSIUM 100 MG PO TABS: 100.0000 mg | ORAL_TABLET | Freq: Every day | ORAL | Status: DC

## 2014-07-10 MED ORDER — INSULIN ASPART 100 UNIT/ML FLEXPEN: PEN_INJECTOR | SUBCUTANEOUS | Status: DC

## 2014-07-10 MED ORDER — PANTOPRAZOLE SODIUM 40 MG PO TBEC: 40.0000 mg | DELAYED_RELEASE_TABLET | Freq: Every day | ORAL | Status: DC

## 2014-07-10 MED ORDER — CARVEDILOL 6.25 MG PO TABS: 6.2500 mg | ORAL_TABLET | Freq: Two times a day (BID) | ORAL | Status: DC

## 2014-07-10 NOTE — Telephone Encounter (Signed)
meds sent to pharm

## 2014-07-13 ENCOUNTER — Encounter: Payer: Self-pay | Admitting: *Deleted

## 2014-07-29 DIAGNOSIS — T8859XA Other complications of anesthesia, initial encounter: Secondary | ICD-10-CM

## 2014-07-29 HISTORY — DX: Other complications of anesthesia, initial encounter: T88.59XA

## 2014-08-08 ENCOUNTER — Ambulatory Visit (INDEPENDENT_AMBULATORY_CARE_PROVIDER_SITE_OTHER): Payer: 59 | Admitting: Ophthalmology

## 2014-08-08 DIAGNOSIS — H4311 Vitreous hemorrhage, right eye: Secondary | ICD-10-CM | POA: Diagnosis not present

## 2014-08-08 DIAGNOSIS — I10 Essential (primary) hypertension: Secondary | ICD-10-CM

## 2014-08-08 DIAGNOSIS — E113599 Type 2 diabetes mellitus with proliferative diabetic retinopathy without macular edema, unspecified eye: Secondary | ICD-10-CM | POA: Diagnosis present

## 2014-08-08 DIAGNOSIS — H4310 Vitreous hemorrhage, unspecified eye: Secondary | ICD-10-CM

## 2014-08-08 DIAGNOSIS — E10351 Type 1 diabetes mellitus with proliferative diabetic retinopathy with macular edema: Secondary | ICD-10-CM

## 2014-08-08 DIAGNOSIS — E10311 Type 1 diabetes mellitus with unspecified diabetic retinopathy with macular edema: Secondary | ICD-10-CM | POA: Diagnosis not present

## 2014-08-08 DIAGNOSIS — H43813 Vitreous degeneration, bilateral: Secondary | ICD-10-CM | POA: Diagnosis not present

## 2014-08-08 DIAGNOSIS — H35033 Hypertensive retinopathy, bilateral: Secondary | ICD-10-CM

## 2014-08-08 HISTORY — DX: Vitreous hemorrhage, unspecified eye: H43.10

## 2014-08-08 NOTE — H&P (Signed)
[name] is an 39 y.o. female.   Chief Complaint:sudden loss of vision right eye HPI: Longstanding type one diabetes with recent vitreous hemorrhage right eye  Past Medical History  Diagnosis Date  . Diabetes mellitus     19 years ago  . Muscle spasms of lower extremity     legs and weakness  . Hypercholesteremia   . Asthma     Past Surgical History  Procedure Laterality Date  . Tubal ligation  2002  . Dilation and curettage of uterus  1997  . Wisdom tooth extraction      age 52  . Cesarean section  1993, 1998, 2002    Family History  Problem Relation Age of Onset  . Diabetes Mother   . Parkinsonism Mother   . Diabetes Father   . Hypertension Father   . Diabetes Brother    Social History:  reports that she has been smoking Cigarettes.  She has a 6 pack-year smoking history. She has never used smokeless tobacco. She reports that she drinks about 0.6 - 1.2 oz of alcohol per week. She reports that she does not use illicit drugs.  Allergies:  Allergies  Allergen Reactions  . Demerol Itching and Rash  . Morphine And Related Itching and Rash       . Zocor [Simvastatin] Itching and Other (See Comments)    Leg pain    No prescriptions prior to admission    Review of systems otherwise negative  There were no vitals taken for this visit.  Physical exam: Mental status: oriented x3. Eyes: See eye exam associated with this date of surgery in media tab.  Scanned in by scanning center Ears, Nose, Throat: within normal limits Neck: Within Normal limits General: within normal limits Chest: Within normal limits Breast: deferred Heart: Within normal limits Abdomen: Within normal limits GU: deferred Extremities: within normal limits Skin: within normal limits  Assessment/Plan Vitreous hemorrhage right eye, proliferative diabetic retinopathy right eye Plan: To [facility] for Pars plana vitrectomy, laser photocoagulation, membrane peel, gas injection right  eye  Hayden Pedro 08/08/2014, 5:57 PM

## 2014-08-13 ENCOUNTER — Other Ambulatory Visit (INDEPENDENT_AMBULATORY_CARE_PROVIDER_SITE_OTHER): Payer: 59 | Admitting: Ophthalmology

## 2014-08-13 DIAGNOSIS — E11311 Type 2 diabetes mellitus with unspecified diabetic retinopathy with macular edema: Secondary | ICD-10-CM | POA: Diagnosis not present

## 2014-08-13 DIAGNOSIS — E11351 Type 2 diabetes mellitus with proliferative diabetic retinopathy with macular edema: Secondary | ICD-10-CM | POA: Diagnosis not present

## 2014-08-15 ENCOUNTER — Telehealth: Payer: Self-pay | Admitting: Family Medicine

## 2014-08-15 MED ORDER — CARVEDILOL 12.5 MG PO TABS: 12.5000 mg | ORAL_TABLET | Freq: Two times a day (BID) | ORAL | Status: DC

## 2014-08-15 NOTE — Telephone Encounter (Signed)
BP's / HR 129/89  110 138/91   119 134/90  115 138/90   119 134/90   115 127/88   107  Per Dr. Dennard Schaumann need to increase the Coreg to 12.5mg  bid - pt aware and new RX sent to pharm.

## 2014-08-24 NOTE — Progress Notes (Signed)
I also instructed patient to check blood sugar the morning and to call us if any problem with it.

## 2014-08-24 NOTE — Progress Notes (Signed)
No answer on home phone, I called cell phone and husband has cell phone.  Patients husband reported that patient is at work and does not get off until 1900.  He will have cell phone all afternoon.  i called patients home number and instructed her to follow instructions given to he by Dr Zigmund Daniel.  I instructed to register in at admitting when she arrives at the hospital.  Medications per Dr Zigmund Daniel instructions.  I also left mesage with [facility]'s phone number, to not wear lotion, powder, cologne, to not shave within 48 hours of surgery.

## 2014-08-27 MED ORDER — CYCLOPENTOLATE HCL 1 % OP SOLN: 1.0000 [drp] | OPHTHALMIC | Status: DC | PRN

## 2014-08-27 MED ORDER — TROPICAMIDE 1 % OP SOLN: 1.0000 [drp] | OPHTHALMIC | Status: DC | PRN

## 2014-08-27 MED ORDER — CEFAZOLIN SODIUM-DEXTROSE 2-3 GM-% IV SOLR
2.0000 g | INTRAVENOUS | Status: AC
  Administered 2014-08-28: 2 g via INTRAVENOUS
  Filled 2014-08-27: qty 50

## 2014-08-27 MED ORDER — PHENYLEPHRINE HCL 2.5 % OP SOLN: 1.0000 [drp] | OPHTHALMIC | Status: DC | PRN

## 2014-08-27 MED ORDER — GATIFLOXACIN 0.5 % OP SOLN: 1.0000 [drp] | OPHTHALMIC | Status: DC | PRN

## 2014-08-28 ENCOUNTER — Other Ambulatory Visit: Payer: Self-pay

## 2014-08-28 ENCOUNTER — Ambulatory Visit (HOSPITAL_COMMUNITY): Payer: 59 | Admitting: Anesthesiology

## 2014-08-28 ENCOUNTER — Encounter (HOSPITAL_COMMUNITY): Payer: Self-pay | Admitting: *Deleted

## 2014-08-28 ENCOUNTER — Ambulatory Visit (HOSPITAL_COMMUNITY)
Admission: RE | Admit: 2014-08-28 | Discharge: 2014-08-29 | Disposition: A | Discharge status: Home / Self Care | Payer: 59 | Source: Ambulatory Visit | Attending: Ophthalmology | Admitting: Ophthalmology

## 2014-08-28 ENCOUNTER — Encounter (HOSPITAL_COMMUNITY)
Admission: RE | Disposition: A | Discharge status: Home / Self Care | Payer: Self-pay | Source: Ambulatory Visit | Attending: Ophthalmology

## 2014-08-28 DIAGNOSIS — Z888 Allergy status to other drugs, medicaments and biological substances status: Secondary | ICD-10-CM | POA: Diagnosis not present

## 2014-08-28 DIAGNOSIS — Z91013 Allergy to seafood: Secondary | ICD-10-CM | POA: Diagnosis not present

## 2014-08-28 DIAGNOSIS — H4311 Vitreous hemorrhage, right eye: Secondary | ICD-10-CM | POA: Diagnosis not present

## 2014-08-28 DIAGNOSIS — K219 Gastro-esophageal reflux disease without esophagitis: Secondary | ICD-10-CM | POA: Diagnosis not present

## 2014-08-28 DIAGNOSIS — Z9851 Tubal ligation status: Secondary | ICD-10-CM | POA: Insufficient documentation

## 2014-08-28 DIAGNOSIS — H4313 Vitreous hemorrhage, bilateral: Secondary | ICD-10-CM | POA: Diagnosis not present

## 2014-08-28 DIAGNOSIS — E113599 Type 2 diabetes mellitus with proliferative diabetic retinopathy without macular edema, unspecified eye: Secondary | ICD-10-CM | POA: Diagnosis present

## 2014-08-28 DIAGNOSIS — E10311 Type 1 diabetes mellitus with unspecified diabetic retinopathy with macular edema: Secondary | ICD-10-CM | POA: Diagnosis not present

## 2014-08-28 DIAGNOSIS — E780 Pure hypercholesterolemia: Secondary | ICD-10-CM | POA: Diagnosis not present

## 2014-08-28 DIAGNOSIS — J45909 Unspecified asthma, uncomplicated: Secondary | ICD-10-CM | POA: Diagnosis not present

## 2014-08-28 DIAGNOSIS — H4310 Vitreous hemorrhage, unspecified eye: Secondary | ICD-10-CM | POA: Diagnosis present

## 2014-08-28 DIAGNOSIS — Z886 Allergy status to analgesic agent status: Secondary | ICD-10-CM | POA: Diagnosis not present

## 2014-08-28 DIAGNOSIS — F17210 Nicotine dependence, cigarettes, uncomplicated: Secondary | ICD-10-CM | POA: Diagnosis not present

## 2014-08-28 DIAGNOSIS — E11359 Type 2 diabetes mellitus with proliferative diabetic retinopathy without macular edema: Secondary | ICD-10-CM | POA: Diagnosis not present

## 2014-08-28 DIAGNOSIS — Z794 Long term (current) use of insulin: Secondary | ICD-10-CM | POA: Insufficient documentation

## 2014-08-28 HISTORY — PX: PARS PLANA VITRECTOMY: SHX2166

## 2014-08-28 HISTORY — PX: MEMBRANE PEEL: SHX5967

## 2014-08-28 HISTORY — PX: LASER PHOTO ABLATION: SHX5942

## 2014-08-28 HISTORY — DX: Type 1 diabetes mellitus without complications: E10.9

## 2014-08-28 HISTORY — PX: GAS/FLUID EXCHANGE: SHX5334

## 2014-08-28 HISTORY — DX: Type 2 diabetes mellitus with unspecified diabetic retinopathy without macular edema: E11.319

## 2014-08-28 HISTORY — DX: Gastro-esophageal reflux disease without esophagitis: K21.9

## 2014-08-28 HISTORY — DX: Essential (primary) hypertension: I10

## 2014-08-28 HISTORY — DX: Vitreous hemorrhage, right eye: H43.11

## 2014-08-28 LAB — GLUCOSE, CAPILLARY
GLUCOSE-CAPILLARY: 69 mg/dL (ref 65–99)
GLUCOSE-CAPILLARY: 214 mg/dL — AB (ref 65–99)
Glucose-Capillary: 120 mg/dL — ABNORMAL HIGH (ref 65–99)
Glucose-Capillary: 114 mg/dL — ABNORMAL HIGH (ref 65–99)
Glucose-Capillary: 51 mg/dL — ABNORMAL LOW (ref 65–99)
Glucose-Capillary: 168 mg/dL — ABNORMAL HIGH (ref 65–99)
Glucose-Capillary: 188 mg/dL — ABNORMAL HIGH (ref 65–99)

## 2014-08-28 LAB — BASIC METABOLIC PANEL
ANION GAP: 11 (ref 5–15)
BUN: 16 mg/dL (ref 6–20)
CALCIUM: 9.4 mg/dL (ref 8.9–10.3)
CO2: 24 mmol/L (ref 22–32)
Chloride: 104 mmol/L (ref 101–111)
Creatinine, Ser: 0.77 mg/dL (ref 0.44–1.00)
Glucose, Bld: 132 mg/dL — ABNORMAL HIGH (ref 65–99)
POTASSIUM: 4.3 mmol/L (ref 3.5–5.1)
SODIUM: 139 mmol/L (ref 135–145)

## 2014-08-28 LAB — HCG, SERUM, QUALITATIVE: PREG SERUM: NEGATIVE

## 2014-08-28 SURGERY — PARS PLANA VITRECTOMY WITH 25 GAUGE
Anesthesia: General | Site: Eye | Laterality: Right

## 2014-08-28 MED ORDER — POLYMYXIN B SULFATE 500000 UNITS IJ SOLR
INTRAMUSCULAR | Status: AC
  Filled 2014-08-28: qty 1

## 2014-08-28 MED ORDER — GATIFLOXACIN 0.5 % OP SOLN
1.0000 [drp] | OPHTHALMIC | Status: AC | PRN
  Administered 2014-08-28 (×3): 1 [drp] via OPHTHALMIC
  Filled 2014-08-28: qty 2.5

## 2014-08-28 MED ORDER — CARVEDILOL 12.5 MG PO TABS
12.5000 mg | ORAL_TABLET | Freq: Two times a day (BID) | ORAL | Status: DC
  Administered 2014-08-28: 12.5 mg via ORAL
  Filled 2014-08-28 (×2): qty 1

## 2014-08-28 MED ORDER — BUPIVACAINE HCL (PF) 0.75 % IJ SOLN
INTRAMUSCULAR | Status: AC
  Filled 2014-08-28: qty 10

## 2014-08-28 MED ORDER — BACITRACIN-POLYMYXIN B 500-10000 UNIT/GM OP OINT
TOPICAL_OINTMENT | OPHTHALMIC | Status: AC
  Filled 2014-08-28: qty 3.5

## 2014-08-28 MED ORDER — ROCURONIUM BROMIDE 50 MG/5ML IV SOLN
INTRAVENOUS | Status: AC
  Filled 2014-08-28: qty 1

## 2014-08-28 MED ORDER — PROPOFOL 10 MG/ML IV BOLUS
INTRAVENOUS | Status: AC
  Filled 2014-08-28: qty 20

## 2014-08-28 MED ORDER — LIDOCAINE HCL (CARDIAC) 20 MG/ML IV SOLN
INTRAVENOUS | Status: AC
  Filled 2014-08-28: qty 5

## 2014-08-28 MED ORDER — ATROPINE SULFATE 1 % OP SOLN
OPHTHALMIC | Status: AC
  Filled 2014-08-28: qty 5

## 2014-08-28 MED ORDER — MORPHINE SULFATE 2 MG/ML IJ SOLN
1.0000 mg | INTRAMUSCULAR | Status: DC | PRN
Start: 2014-08-28 — End: 2014-08-29

## 2014-08-28 MED ORDER — INSULIN ASPART 100 UNIT/ML ~~LOC~~ SOLN
0.0000 [IU] | SUBCUTANEOUS | Status: DC
  Administered 2014-08-28: 5 [IU] via SUBCUTANEOUS
  Administered 2014-08-28 – 2014-08-29 (×3): 3 [IU] via SUBCUTANEOUS
  Administered 2014-08-29: 5 [IU] via SUBCUTANEOUS

## 2014-08-28 MED ORDER — LIDOCAINE HCL (CARDIAC) 20 MG/ML IV SOLN
INTRAVENOUS | Status: DC | PRN
  Administered 2014-08-28: 60 mg via INTRAVENOUS
  Administered 2014-08-28: 50 mg via INTRAVENOUS

## 2014-08-28 MED ORDER — TETRACAINE HCL 0.5 % OP SOLN
2.0000 [drp] | Freq: Once | OPHTHALMIC | Status: DC
  Filled 2014-08-28: qty 2

## 2014-08-28 MED ORDER — BUPIVACAINE HCL (PF) 0.75 % IJ SOLN
INTRAMUSCULAR | Status: DC | PRN
  Administered 2014-08-28: 10 mL

## 2014-08-28 MED ORDER — PANTOPRAZOLE SODIUM 40 MG PO TBEC
40.0000 mg | DELAYED_RELEASE_TABLET | Freq: Every day | ORAL | Status: DC
Start: 2014-08-29 — End: 2014-08-29

## 2014-08-28 MED ORDER — ACETAZOLAMIDE SODIUM 500 MG IJ SOLR
500.0000 mg | Freq: Once | INTRAMUSCULAR | Status: AC
  Administered 2014-08-29: 500 mg via INTRAVENOUS
  Filled 2014-08-28: qty 500

## 2014-08-28 MED ORDER — CYCLOPENTOLATE HCL 1 % OP SOLN
1.0000 [drp] | OPHTHALMIC | Status: AC | PRN
  Administered 2014-08-28 (×3): 1 [drp] via OPHTHALMIC
  Filled 2014-08-28: qty 2

## 2014-08-28 MED ORDER — ROCURONIUM BROMIDE 100 MG/10ML IV SOLN
INTRAVENOUS | Status: DC | PRN
  Administered 2014-08-28: 35 mg via INTRAVENOUS

## 2014-08-28 MED ORDER — OXYCODONE HCL 5 MG/5ML PO SOLN: 5.0000 mg | Freq: Once | ORAL | Status: DC | PRN

## 2014-08-28 MED ORDER — BUDESONIDE-FORMOTEROL FUMARATE 160-4.5 MCG/ACT IN AERO
2.0000 | INHALATION_SPRAY | Freq: Two times a day (BID) | RESPIRATORY_TRACT | Status: DC
  Filled 2014-08-28: qty 6

## 2014-08-28 MED ORDER — INSULIN GLARGINE 100 UNIT/ML ~~LOC~~ SOLN
70.0000 [IU] | Freq: Every day | SUBCUTANEOUS | Status: DC
  Administered 2014-08-29: 70 [IU] via SUBCUTANEOUS
  Filled 2014-08-28: qty 0.7

## 2014-08-28 MED ORDER — ALBUTEROL SULFATE (2.5 MG/3ML) 0.083% IN NEBU
INHALATION_SOLUTION | RESPIRATORY_TRACT | Status: AC
  Filled 2014-08-28: qty 3

## 2014-08-28 MED ORDER — 0.9 % SODIUM CHLORIDE (POUR BTL) OPTIME
TOPICAL | Status: DC | PRN
  Administered 2014-08-28: 1000 mL

## 2014-08-28 MED ORDER — GENTAMICIN SULFATE 40 MG/ML IJ SOLN
INTRAMUSCULAR | Status: AC
  Filled 2014-08-28: qty 2

## 2014-08-28 MED ORDER — MAGNESIUM HYDROXIDE 400 MG/5ML PO SUSP: 15.0000 mL | Freq: Four times a day (QID) | ORAL | Status: DC | PRN

## 2014-08-28 MED ORDER — FENTANYL CITRATE (PF) 100 MCG/2ML IJ SOLN
INTRAMUSCULAR | Status: DC | PRN
  Administered 2014-08-28: 100 ug via INTRAVENOUS
  Administered 2014-08-28: 50 ug via INTRAVENOUS
  Administered 2014-08-28: 100 ug via INTRAVENOUS

## 2014-08-28 MED ORDER — ONDANSETRON HCL 4 MG/2ML IJ SOLN
INTRAMUSCULAR | Status: DC | PRN
  Administered 2014-08-28: 4 mg via INTRAVENOUS

## 2014-08-28 MED ORDER — SODIUM CHLORIDE 0.9 % IV SOLN
INTRAVENOUS | Status: DC | PRN
Start: 2014-08-28 — End: 2014-08-28
  Administered 2014-08-28 (×2): via INTRAVENOUS

## 2014-08-28 MED ORDER — GLYCOPYRROLATE 0.2 MG/ML IJ SOLN
INTRAMUSCULAR | Status: DC | PRN
Start: 2014-08-28 — End: 2014-08-28
  Administered 2014-08-28: .7 mg via INTRAVENOUS

## 2014-08-28 MED ORDER — OXYCODONE HCL 5 MG PO TABS: 5.0000 mg | ORAL_TABLET | Freq: Once | ORAL | Status: DC | PRN

## 2014-08-28 MED ORDER — INSULIN GLARGINE 100 UNIT/ML SOLOSTAR PEN: 70.0000 [IU] | PEN_INJECTOR | Freq: Every day | SUBCUTANEOUS | Status: DC

## 2014-08-28 MED ORDER — ALBUTEROL SULFATE (2.5 MG/3ML) 0.083% IN NEBU: 2.5000 mg | INHALATION_SOLUTION | Freq: Four times a day (QID) | RESPIRATORY_TRACT | Status: DC

## 2014-08-28 MED ORDER — GATIFLOXACIN 0.5 % OP SOLN
1.0000 [drp] | Freq: Four times a day (QID) | OPHTHALMIC | Status: DC
  Filled 2014-08-28: qty 2.5

## 2014-08-28 MED ORDER — BRIMONIDINE TARTRATE 0.2 % OP SOLN
1.0000 [drp] | Freq: Two times a day (BID) | OPHTHALMIC | Status: DC
  Filled 2014-08-28: qty 5

## 2014-08-28 MED ORDER — TEMAZEPAM 15 MG PO CAPS: 15.0000 mg | ORAL_CAPSULE | Freq: Every evening | ORAL | Status: DC | PRN

## 2014-08-28 MED ORDER — ONDANSETRON HCL 4 MG/2ML IJ SOLN
INTRAMUSCULAR | Status: AC
  Filled 2014-08-28: qty 2

## 2014-08-28 MED ORDER — NEOSTIGMINE METHYLSULFATE 10 MG/10ML IV SOLN
INTRAVENOUS | Status: DC | PRN
  Administered 2014-08-28: 4 mg via INTRAVENOUS

## 2014-08-28 MED ORDER — BSS PLUS IO SOLN
INTRAOCULAR | Status: AC
  Filled 2014-08-28: qty 500

## 2014-08-28 MED ORDER — TROPICAMIDE 1 % OP SOLN
1.0000 [drp] | OPHTHALMIC | Status: AC | PRN
  Administered 2014-08-28 (×3): 1 [drp] via OPHTHALMIC
  Filled 2014-08-28: qty 3

## 2014-08-28 MED ORDER — EPINEPHRINE HCL 1 MG/ML IJ SOLN
INTRAOCULAR | Status: DC | PRN
  Administered 2014-08-28: 12:00:00

## 2014-08-28 MED ORDER — LATANOPROST 0.005 % OP SOLN
1.0000 [drp] | Freq: Every day | OPHTHALMIC | Status: DC
  Filled 2014-08-28: qty 2.5

## 2014-08-28 MED ORDER — PREDNISOLONE ACETATE 1 % OP SUSP
1.0000 [drp] | Freq: Four times a day (QID) | OPHTHALMIC | Status: DC
Start: 2014-08-29 — End: 2014-08-29
  Filled 2014-08-28: qty 5

## 2014-08-28 MED ORDER — FENTANYL CITRATE (PF) 250 MCG/5ML IJ SOLN
INTRAMUSCULAR | Status: AC
  Filled 2014-08-28: qty 5

## 2014-08-28 MED ORDER — LACTATED RINGERS IV SOLN: INTRAVENOUS | Status: DC | PRN

## 2014-08-28 MED ORDER — LOSARTAN POTASSIUM 50 MG PO TABS: 100.0000 mg | ORAL_TABLET | Freq: Every day | ORAL | Status: DC

## 2014-08-28 MED ORDER — PHENYLEPHRINE 40 MCG/ML (10ML) SYRINGE FOR IV PUSH (FOR BLOOD PRESSURE SUPPORT)
PREFILLED_SYRINGE | INTRAVENOUS | Status: AC
  Filled 2014-08-28: qty 10

## 2014-08-28 MED ORDER — EPINEPHRINE HCL 1 MG/ML IJ SOLN
INTRAMUSCULAR | Status: AC
  Filled 2014-08-28: qty 1

## 2014-08-28 MED ORDER — MIDAZOLAM HCL 5 MG/5ML IJ SOLN
INTRAMUSCULAR | Status: DC | PRN
  Administered 2014-08-28: 2 mg via INTRAVENOUS

## 2014-08-28 MED ORDER — ONETOUCH ULTRA SYSTEM W/DEVICE KIT: 1.0000 | PACK | Freq: Once | Status: DC

## 2014-08-28 MED ORDER — DEXAMETHASONE SODIUM PHOSPHATE 10 MG/ML IJ SOLN
INTRAMUSCULAR | Status: DC | PRN
  Administered 2014-08-28: 10 mg via INTRAVENOUS

## 2014-08-28 MED ORDER — BACITRACIN-POLYMYXIN B 500-10000 UNIT/GM OP OINT
TOPICAL_OINTMENT | OPHTHALMIC | Status: DC | PRN
  Administered 2014-08-28: 1 via OPHTHALMIC

## 2014-08-28 MED ORDER — DEXAMETHASONE SODIUM PHOSPHATE 10 MG/ML IJ SOLN
INTRAMUSCULAR | Status: AC
  Filled 2014-08-28: qty 1

## 2014-08-28 MED ORDER — PROMETHAZINE HCL 25 MG/ML IJ SOLN: 6.2500 mg | INTRAMUSCULAR | Status: DC | PRN

## 2014-08-28 MED ORDER — PROPOFOL 10 MG/ML IV BOLUS
INTRAVENOUS | Status: DC | PRN
  Administered 2014-08-28: 200 mg via INTRAVENOUS

## 2014-08-28 MED ORDER — SODIUM CHLORIDE 0.45 % IV SOLN
INTRAVENOUS | Status: DC
  Administered 2014-08-28: 17:00:00 via INTRAVENOUS

## 2014-08-28 MED ORDER — ACETAMINOPHEN 325 MG PO TABS: 325.0000 mg | ORAL_TABLET | ORAL | Status: DC | PRN

## 2014-08-28 MED ORDER — ONDANSETRON HCL 4 MG/2ML IJ SOLN: 4.0000 mg | Freq: Four times a day (QID) | INTRAMUSCULAR | Status: DC | PRN

## 2014-08-28 MED ORDER — HYDROMORPHONE HCL 1 MG/ML IJ SOLN
0.2500 mg | INTRAMUSCULAR | Status: DC | PRN
  Administered 2014-08-28 (×2): 0.5 mg via INTRAVENOUS

## 2014-08-28 MED ORDER — HYDROMORPHONE HCL 1 MG/ML IJ SOLN
INTRAMUSCULAR | Status: AC
  Filled 2014-08-28: qty 1

## 2014-08-28 MED ORDER — SODIUM HYALURONATE 10 MG/ML IO SOLN
INTRAOCULAR | Status: DC | PRN
  Administered 2014-08-28: 0.85 mL via INTRAOCULAR

## 2014-08-28 MED ORDER — PHENYLEPHRINE HCL 2.5 % OP SOLN
1.0000 [drp] | OPHTHALMIC | Status: AC | PRN
  Administered 2014-08-28 (×3): 1 [drp] via OPHTHALMIC
  Filled 2014-08-28: qty 2

## 2014-08-28 MED ORDER — BACITRACIN-POLYMYXIN B 500-10000 UNIT/GM OP OINT
1.0000 "application " | TOPICAL_OINTMENT | Freq: Three times a day (TID) | OPHTHALMIC | Status: DC
  Filled 2014-08-28: qty 3.5

## 2014-08-28 MED ORDER — SODIUM HYALURONATE 10 MG/ML IO SOLN
INTRAOCULAR | Status: AC
  Filled 2014-08-28: qty 0.85

## 2014-08-28 MED ORDER — SODIUM CHLORIDE 0.9 % IV SOLN
INTRAVENOUS | Status: DC
  Administered 2014-08-28: 10:00:00 via INTRAVENOUS

## 2014-08-28 MED ORDER — HYDROCODONE-ACETAMINOPHEN 5-325 MG PO TABS
1.0000 | ORAL_TABLET | ORAL | Status: DC | PRN
  Administered 2014-08-28 – 2014-08-29 (×4): 2 via ORAL
  Filled 2014-08-28 (×4): qty 2

## 2014-08-28 MED ORDER — SODIUM CHLORIDE 0.9 % IJ SOLN
INTRAMUSCULAR | Status: DC | PRN
  Administered 2014-08-28: 12:00:00

## 2014-08-28 MED ORDER — SODIUM CHLORIDE 0.9 % IJ SOLN
INTRAMUSCULAR | Status: AC
  Filled 2014-08-28: qty 10

## 2014-08-28 MED ORDER — MIDAZOLAM HCL 2 MG/2ML IJ SOLN
INTRAMUSCULAR | Status: AC
  Filled 2014-08-28: qty 2

## 2014-08-28 MED ORDER — AMLODIPINE BESYLATE 10 MG PO TABS: 10.0000 mg | ORAL_TABLET | Freq: Every day | ORAL | Status: DC

## 2014-08-28 MED ORDER — BSS IO SOLN
INTRAOCULAR | Status: DC | PRN
  Administered 2014-08-28: 15 mL via INTRAOCULAR

## 2014-08-28 MED ORDER — PHENYLEPHRINE HCL 10 MG/ML IJ SOLN
INTRAMUSCULAR | Status: DC | PRN
  Administered 2014-08-28 (×6): 80 ug via INTRAVENOUS

## 2014-08-28 MED ORDER — ALBUTEROL SULFATE HFA 108 (90 BASE) MCG/ACT IN AERS: 1.0000 | INHALATION_SPRAY | Freq: Four times a day (QID) | RESPIRATORY_TRACT | Status: DC

## 2014-08-28 SURGICAL SUPPLY — 68 items
APPLICATOR DR MATTHEWS STRL (MISCELLANEOUS) IMPLANT
BLADE EYE CATARACT 19 1.4 BEAV (BLADE) IMPLANT
BLADE MVR KNIFE 19G (BLADE) IMPLANT
BLADE MVR KNIFE 20G (BLADE) IMPLANT
CANNULA DUAL BORE 23G (CANNULA) IMPLANT
CANNULA VLV SOFT TIP 25GA (OPHTHALMIC) ×2 IMPLANT
CORDS BIPOLAR (ELECTRODE) ×2 IMPLANT
COTTONBALL LRG STERILE PKG (GAUZE/BANDAGES/DRESSINGS) ×6 IMPLANT
COVER MAYO STAND STRL (DRAPES) IMPLANT
DRAPE INCISE 51X51 W/FILM STRL (DRAPES) ×2 IMPLANT
DRAPE OPHTHALMIC 77X100 STRL (CUSTOM PROCEDURE TRAY) ×2 IMPLANT
FILTER BLUE MILLIPORE (MISCELLANEOUS) IMPLANT
FILTER STRAW FLUID ASPIR (MISCELLANEOUS) IMPLANT
FORCEPS ECKARDT ILM 25G SERR (OPHTHALMIC RELATED) IMPLANT
FORCEPS GRIESHABER ILM 25G A (INSTRUMENTS) IMPLANT
GAS AUTO FILL CONSTEL (OPHTHALMIC) ×2
GAS AUTO FILL CONSTELLATION (OPHTHALMIC) ×1 IMPLANT
GLOVE BIOGEL PI IND STRL 6.5 (GLOVE) ×2 IMPLANT
GLOVE BIOGEL PI INDICATOR 6.5 (GLOVE) ×2
GLOVE SS BIOGEL STRL SZ 6.5 (GLOVE) ×2 IMPLANT
GLOVE SS BIOGEL STRL SZ 7 (GLOVE) ×1 IMPLANT
GLOVE SUPERSENSE BIOGEL SZ 6.5 (GLOVE) ×2
GLOVE SUPERSENSE BIOGEL SZ 7 (GLOVE) ×1
GLOVE SURG 8.5 LATEX PF (GLOVE) ×2 IMPLANT
GLOVE SURG SS PI 6.5 STRL IVOR (GLOVE) ×4 IMPLANT
GOWN STRL REUS W/ TWL LRG LVL3 (GOWN DISPOSABLE) ×5 IMPLANT
GOWN STRL REUS W/TWL LRG LVL3 (GOWN DISPOSABLE) ×5
HANDLE PNEUMATIC FOR CONSTEL (OPHTHALMIC) IMPLANT
KIT BASIN OR (CUSTOM PROCEDURE TRAY) ×2 IMPLANT
KNIFE CRESCENT 2.5 55 ANG (BLADE) IMPLANT
MICROPICK 25G (MISCELLANEOUS)
NEEDLE 18GX1X1/2 (RX/OR ONLY) (NEEDLE) ×2 IMPLANT
NEEDLE 25GX 5/8IN NON SAFETY (NEEDLE) ×2 IMPLANT
NEEDLE 27GAX1X1/2 (NEEDLE) IMPLANT
NEEDLE FILTER BLUNT 18X 1/2SAF (NEEDLE) ×1
NEEDLE FILTER BLUNT 18X1 1/2 (NEEDLE) ×1 IMPLANT
NEEDLE HYPO 30X.5 LL (NEEDLE) IMPLANT
NS IRRIG 1000ML POUR BTL (IV SOLUTION) ×2 IMPLANT
PACK VITRECTOMY CUSTOM (CUSTOM PROCEDURE TRAY) ×2 IMPLANT
PAD ARMBOARD 7.5X6 YLW CONV (MISCELLANEOUS) ×4 IMPLANT
PAK PIK VITRECTOMY CVS 25GA (OPHTHALMIC) ×2 IMPLANT
PENCIL BIPOLAR 25GA STR DISP (OPHTHALMIC RELATED) ×2 IMPLANT
PIC ILLUMINATED 25G (OPHTHALMIC) ×2
PICK MICROPICK 25G (MISCELLANEOUS) IMPLANT
PIK ILLUMINATED 25G (OPHTHALMIC) ×1 IMPLANT
PROBE LASER ILLUM FLEX CVD 25G (OPHTHALMIC) IMPLANT
REPL STRA BRUSH NEEDLE (NEEDLE) IMPLANT
RESERVOIR BACK FLUSH (MISCELLANEOUS) IMPLANT
ROLLS DENTAL (MISCELLANEOUS) ×4 IMPLANT
SCISSORS TIP ADVANCED DSP 25GA (INSTRUMENTS) IMPLANT
SCRAPER DIAMOND 25GA (OPHTHALMIC RELATED) IMPLANT
SCRAPER DIAMOND DUST MEMBRANE (MISCELLANEOUS) IMPLANT
SPONGE SURGIFOAM ABS GEL 12-7 (HEMOSTASIS) ×2 IMPLANT
STOPCOCK 4 WAY LG BORE MALE ST (IV SETS) ×2 IMPLANT
SUT CHROMIC 7 0 TG140 8 (SUTURE) IMPLANT
SUT ETHILON 10 0 CS140 6 (SUTURE) IMPLANT
SUT ETHILON 9 0 TG140 8 (SUTURE) IMPLANT
SUT POLY NON ABSORB 10-0 8 STR (SUTURE) IMPLANT
SUT SILK 4 0 RB 1 (SUTURE) IMPLANT
SYR 20CC LL (SYRINGE) ×4 IMPLANT
SYR 5ML LL (SYRINGE) IMPLANT
SYR BULB 3OZ (MISCELLANEOUS) ×2 IMPLANT
SYR TB 1ML LUER SLIP (SYRINGE) ×2 IMPLANT
SYRINGE 10CC LL (SYRINGE) IMPLANT
TOWEL OR 17X24 6PK STRL BLUE (TOWEL DISPOSABLE) ×2 IMPLANT
TUBING HIGH PRESS EXTEN 6IN (TUBING) IMPLANT
WATER STERILE IRR 1000ML POUR (IV SOLUTION) ×2 IMPLANT
WIPE INSTRUMENT VISIWIPE 73X73 (MISCELLANEOUS) IMPLANT

## 2014-08-28 NOTE — H&P (Signed)
I examined the patient today and there is no change in the medical status 

## 2014-08-28 NOTE — Transfer of Care (Signed)
Immediate Anesthesia Transfer of Care Note  Patient: [name]  Procedure(s) Performed: Procedure(s): PARS PLANA VITRECTOMY WITH 25 GAUGE (Right) MEMBRANE PEEL (Right) LASER PHOTO ABLATION (Right) GAS/FLUID EXCHANGE (Right)  Patient Location: PACU  Anesthesia Type:General  Level of Consciousness: awake, alert , oriented and patient cooperative  Airway & Oxygen Therapy: Patient Spontanous Breathing and Patient connected to nasal cannula oxygen  Post-op Assessment: Report given to RN, Post -op Vital signs reviewed and stable and Patient moving all extremities  Post vital signs: Reviewed and stable  Last Vitals:  Filed Vitals:   08/28/14 0915  BP: 107/65  Pulse: 96  Temp: 37.1 C  Resp: 18    Complications: No apparent anesthesia complications

## 2014-08-28 NOTE — Anesthesia Procedure Notes (Signed)
Procedure Name: Intubation Date/Time: 08/28/2014 11:45 AM Performed by: Greggory Stallion, Kaelah Hayashi L Pre-anesthesia Checklist: Patient identified, Emergency Drugs available, Suction available, Patient being monitored and Timeout performed Patient Re-evaluated:Patient Re-evaluated prior to inductionOxygen Delivery Method: Circle system utilized Preoxygenation: Pre-oxygenation with 100% oxygen Intubation Type: IV induction Ventilation: Mask ventilation without difficulty Laryngoscope Size: Mac and 4 Grade View: Grade I Tube type: Oral Tube size: 7.5 mm Number of attempts: 1 Airway Equipment and Method: Stylet Placement Confirmation: ETT inserted through vocal cords under direct vision,  positive ETCO2 and breath sounds checked- equal and bilateral Secured at: 20 cm Tube secured with: Tape Dental Injury: Teeth and Oropharynx as per pre-operative assessment

## 2014-08-28 NOTE — Progress Notes (Signed)
Patient reports 5 episodes of diarrhea since coming into short stay. Patient denies being on antibiotics recently, exposure to sick persons, recent hospitalization, or eating something that she normally does not eat. Patient reports that this is her normal pattern when she comes into the hospital, she gets nervous, and she reports that she starts to have diarrhea.

## 2014-08-28 NOTE — Brief Op Note (Signed)
Brief Operative note   Preoperative diagnosis:  vitreous hemorrhage right eye Postoperative diagnosis  * No Diagnosis Codes entered *  Procedures: Pars plana vitrectomy, laser, membrane peel, gas injection, endodiathermy right eye-  Surgeon:  Hayden Pedro, MD...  Assistant:  Deatra Ina SA    Anesthesia: General  Specimen: none  Estimated blood loss:  1cc  Complications: none  Patient sent to PACU in good condition  Composed by Hayden Pedro MD  Dictation number: [id number]

## 2014-08-28 NOTE — Anesthesia Preprocedure Evaluation (Addendum)
Anesthesia Evaluation  Patient identified by MRN, date of birth, ID band Patient awake    Reviewed: Allergy & Precautions, NPO status , Patient's Chart, lab work & pertinent test results, reviewed documented beta blocker date and time   Airway Mallampati: II  TM Distance: >3 FB Neck ROM: Full    Dental   Pulmonary asthma , Current Smoker,  breath sounds clear to auscultation        Cardiovascular hypertension, Pt. on medications and Pt. on home beta blockers Rhythm:Regular Rate:Normal     Neuro/Psych negative neurological ROS     GI/Hepatic Neg liver ROS, GERD-  ,  Endo/Other  diabetes, Insulin Dependent  Renal/GU      Musculoskeletal   Abdominal   Peds  Hematology negative hematology ROS (+)   Anesthesia Other Findings   Reproductive/Obstetrics                            Lab Results  Component Value Date   CREATININE 0.68 07/03/2014   BUN 11 07/03/2014   NA 138 07/03/2014   K 4.0 07/03/2014   CL 103 07/03/2014   CO2 26 07/03/2014   Lab Results  Component Value Date   WBC 9.1 05/08/2014   HGB 14.6 05/08/2014   HCT 43.0 05/08/2014   MCV 89.8 05/08/2014   PLT 286 05/08/2014    Anesthesia Physical Anesthesia Plan  ASA: II  Anesthesia Plan: General   Post-op Pain Management:    Induction: Intravenous  Airway Management Planned: LMA and Oral ETT  Additional Equipment:   Intra-op Plan:   Post-operative Plan: Extubation in OR  Informed Consent: I have reviewed the patients History and Physical, chart, labs and discussed the procedure including the risks, benefits and alternatives for the proposed anesthesia with the patient or authorized representative who has indicated his/her understanding and acceptance.   Dental advisory given  Plan Discussed with: CRNA  Anesthesia Plan Comments:         Anesthesia Quick Evaluation

## 2014-08-28 NOTE — Anesthesia Postprocedure Evaluation (Signed)
  Anesthesia Post-op Note  Patient: [name]  Procedure(s) Performed: Procedure(s): PARS PLANA VITRECTOMY WITH 25 GAUGE (Right) MEMBRANE PEEL (Right) LASER PHOTO ABLATION (Right) GAS/FLUID EXCHANGE (Right)  Patient Location: PACU  Anesthesia Type:General  Level of Consciousness: awake and alert   Airway and Oxygen Therapy: Patient Spontanous Breathing  Post-op Pain: none  Post-op Assessment: Post-op Vital signs reviewed  Post-op Vital Signs: Reviewed  Last Vitals:  Filed Vitals:   08/28/14 1500  BP: 95/59  Pulse: 57  Temp: 36.8 C  Resp: 18    Complications: No apparent anesthesia complications

## 2014-08-29 ENCOUNTER — Encounter (HOSPITAL_COMMUNITY): Payer: Self-pay | Admitting: Ophthalmology

## 2014-08-29 DIAGNOSIS — E11359 Type 2 diabetes mellitus with proliferative diabetic retinopathy without macular edema: Secondary | ICD-10-CM | POA: Diagnosis not present

## 2014-08-29 LAB — GLUCOSE, CAPILLARY
GLUCOSE-CAPILLARY: 209 mg/dL — AB (ref 65–99)
Glucose-Capillary: 160 mg/dL — ABNORMAL HIGH (ref 65–99)

## 2014-08-29 MED ORDER — GATIFLOXACIN 0.5 % OP SOLN: 1.0000 [drp] | Freq: Four times a day (QID) | OPHTHALMIC | Status: DC

## 2014-08-29 MED ORDER — BACITRACIN-POLYMYXIN B 500-10000 UNIT/GM OP OINT: 1.0000 "application " | TOPICAL_OINTMENT | Freq: Three times a day (TID) | OPHTHALMIC | Status: DC

## 2014-08-29 MED ORDER — PREDNISOLONE ACETATE 1 % OP SUSP: 1.0000 [drp] | Freq: Four times a day (QID) | OPHTHALMIC | Status: DC

## 2014-08-29 NOTE — Progress Notes (Signed)
08/29/2014, 6:25 AM  Mental Status:  Awake, Alert, Oriented  Anterior segment: Cornea  Clear    Anterior Chamber Clear    Lens:   Clear,  Intra Ocular Pressure 13 mmHg with [place]open  Vitreous: Clear 25%gas bubble   Retina:  Attached Good laser reaction   Impression: Excellent result Retina attached   Final Diagnosis: Principal Problem:   Vitreous hemorrhage Active Problems:   Proliferative diabetic retinopathy   Vitreous hemorrhage of right eye   Plan: start post operative eye drops.  Discharge to home.  Give post operative instructions  Hayden Pedro 08/29/2014, 6:25 AM

## 2014-08-29 NOTE — Discharge Summary (Signed)
Discharge summary not needed on [facility] patients per medical records. 

## 2014-08-29 NOTE — Op Note (Signed)
[name], [name]               ACCOUNT NO.:  [id number]  MEDICAL RECORD NO.:  [id number]  LOCATION:  [id number]                        FACILITY:  [facility]  PHYSICIAN:  Chrystie Nose. Zigmund Daniel, M.D. DATE OF BIRTH:  [birth year]  DATE OF PROCEDURE:  08/28/2014 DATE OF DISCHARGE:                              OPERATIVE REPORT   ADMISSION DIAGNOSES:  Vitreous hemorrhage, posterior vitreous membranes in the right eye, proliferative diabetic retinopathy, right eye.  PROCEDURES:  Pars plana vitrectomy, membrane peel, endodiathermy, retinal photocoagulation, gas fluid exchange, right eye.  SURGEON:  Chrystie Nose. Zigmund Daniel, M.D.  ASSISTANT:  Deatra Ina, SA.  ANESTHESIA:  General.  DETAILS:  After usual prep and drape, 25-gauge trocars placed at 8, 10, and 2 o'clock.  Infusion at 8 o'clock.  Provisc placed on the corneal surface and the wide field BIOM viewing system was moved into place. Pars plana vitrectomy was carried out.  It was begun just behind the crystalline lens.  Dark red blood was encountered.  This was carefully removed under low suction and rapid cutting.  Large clots of blood were encountered and removed under low suction and rapid cutting.  A core vitrectomy was completed down to the macular surface.  The superior arcade was under traction from vitreoretinal traction.  This traction was relieved and the membrane was peeled with the lighted pick and the vitrectomy cutter.  Endo-diathermy was used for hemostasis.  Vitrectomy was carried in the mid periphery where additional surface proliferation was removed.  Vitrectomy was carried into the far periphery where vitreous and blood was carefully removed for 360 degrees.  Scleral depression was used to gain access to the extreme vitreous base.  Once all blood was removed, the endolaser was positioned in the eye, 787 burns were placed around the retinal periphery, the power was 300 mW 1000 microns each and 0.1 seconds each.  A 50% gas fluid  exchange was carried out with sterile room air.  Instruments were removed from the eye.  The wounds were tested and found to be secure.  Polymyxin and gentamicin were irrigated into tenon space.  Marcaine was injected around the globe for postop pain.  Decadron 10 mg was injected into the lower subconjunctival space.  Closing pressure was 10 with a Pharmacologist.  Polysporin ophthalmic ointment, patch and shield were placed.  The patient was awakened and taken to recovery in a satisfactory condition.     Chrystie Nose. Zigmund Daniel, M.D.    JDM/MEDQ  D:  08/28/2014  T:  08/29/2014  Job:  [id number]

## 2014-09-04 ENCOUNTER — Encounter (INDEPENDENT_AMBULATORY_CARE_PROVIDER_SITE_OTHER): Payer: 59 | Admitting: Ophthalmology

## 2014-09-04 DIAGNOSIS — E11351 Type 2 diabetes mellitus with proliferative diabetic retinopathy with macular edema: Secondary | ICD-10-CM

## 2014-09-04 DIAGNOSIS — E10311 Type 1 diabetes mellitus with unspecified diabetic retinopathy with macular edema: Secondary | ICD-10-CM

## 2014-09-04 LAB — HM DIABETES EYE EXAM

## 2014-09-06 ENCOUNTER — Other Ambulatory Visit: Payer: Self-pay | Admitting: Family Medicine

## 2014-09-06 NOTE — Telephone Encounter (Signed)
Refill appropriate and filled per protocol. 

## 2014-09-25 ENCOUNTER — Encounter (INDEPENDENT_AMBULATORY_CARE_PROVIDER_SITE_OTHER): Payer: 59 | Admitting: Ophthalmology

## 2014-09-25 DIAGNOSIS — E11359 Type 2 diabetes mellitus with proliferative diabetic retinopathy without macular edema: Secondary | ICD-10-CM

## 2014-10-11 ENCOUNTER — Other Ambulatory Visit: Payer: Self-pay | Admitting: Family Medicine

## 2014-10-11 ENCOUNTER — Encounter: Payer: Self-pay | Admitting: Family Medicine

## 2014-10-11 NOTE — Telephone Encounter (Signed)
Medication refilled per protocol. 

## 2014-10-11 NOTE — Telephone Encounter (Signed)
Medication refill for one time only.  Patient needs to be seen.  Letter sent for patient to call and schedule 

## 2014-11-07 ENCOUNTER — Ambulatory Visit (INDEPENDENT_AMBULATORY_CARE_PROVIDER_SITE_OTHER): Payer: Commercial Managed Care - HMO | Admitting: Family Medicine

## 2014-11-07 ENCOUNTER — Telehealth: Payer: Self-pay | Admitting: Family Medicine

## 2014-11-07 ENCOUNTER — Encounter: Payer: Self-pay | Admitting: Family Medicine

## 2014-11-07 VITALS — BP 128/70 | HR 82 | Temp 99.0°F | Resp 18 | Ht 63.0 in | Wt 175.0 lb

## 2014-11-07 DIAGNOSIS — I471 Supraventricular tachycardia: Secondary | ICD-10-CM

## 2014-11-07 DIAGNOSIS — E083499 Diabetes mellitus due to underlying condition with severe nonproliferative diabetic retinopathy without macular edema, unspecified eye: Secondary | ICD-10-CM

## 2014-11-07 DIAGNOSIS — R000 Tachycardia, unspecified: Secondary | ICD-10-CM

## 2014-11-07 DIAGNOSIS — IMO0001 Reserved for inherently not codable concepts without codable children: Secondary | ICD-10-CM

## 2014-11-07 DIAGNOSIS — E08349 Diabetes mellitus due to underlying condition with severe nonproliferative diabetic retinopathy without macular edema: Secondary | ICD-10-CM

## 2014-11-07 DIAGNOSIS — E119 Type 2 diabetes mellitus without complications: Principal | ICD-10-CM

## 2014-11-07 DIAGNOSIS — E109 Type 1 diabetes mellitus without complications: Secondary | ICD-10-CM

## 2014-11-07 DIAGNOSIS — Z794 Long term (current) use of insulin: Principal | ICD-10-CM

## 2014-11-07 LAB — COMPLETE METABOLIC PANEL WITH GFR
ALT: 17 U/L (ref 6–29)
AST: 16 U/L (ref 10–30)
Albumin: 4.3 g/dL (ref 3.6–5.1)
Alkaline Phosphatase: 68 U/L (ref 33–115)
BUN: 15 mg/dL (ref 7–25)
CHLORIDE: 99 mmol/L (ref 98–110)
CO2: 27 mmol/L (ref 20–31)
Calcium: 10.2 mg/dL (ref 8.6–10.2)
Creat: 0.76 mg/dL (ref 0.50–1.10)
Glucose, Bld: 37 mg/dL — CL (ref 70–99)
POTASSIUM: 3.8 mmol/L (ref 3.5–5.3)
Sodium: 138 mmol/L (ref 135–146)
TOTAL PROTEIN: 7.2 g/dL (ref 6.1–8.1)
Total Bilirubin: 0.5 mg/dL (ref 0.2–1.2)

## 2014-11-07 LAB — HEMOGLOBIN A1C
HEMOGLOBIN A1C: 6.6 % — AB (ref <5.7)
MEAN PLASMA GLUCOSE: 143 mg/dL — AB (ref <117)

## 2014-11-07 LAB — LIPID PANEL
CHOL/HDL RATIO: 6.4 ratio — AB (ref <=5.0)
CHOLESTEROL: 205 mg/dL — AB (ref 125–200)
HDL: 32 mg/dL — ABNORMAL LOW (ref >=46)
LDL CALC: 147 mg/dL — AB (ref <130)
Triglycerides: 129 mg/dL (ref <150)
VLDL: 26 mg/dL (ref <30)

## 2014-11-07 MED ORDER — CARVEDILOL 12.5 MG PO TABS: ORAL_TABLET | ORAL | Status: DC

## 2014-11-07 MED ORDER — INSULIN GLARGINE 100 UNIT/ML SOLOSTAR PEN: 70.0000 [IU] | PEN_INJECTOR | Freq: Every day | SUBCUTANEOUS | Status: DC

## 2014-11-07 MED ORDER — INSULIN ASPART 100 UNIT/ML FLEXPEN: PEN_INJECTOR | SUBCUTANEOUS | Status: DC

## 2014-11-07 MED ORDER — GLUCOSE BLOOD VI STRP: ORAL_STRIP | Status: DC

## 2014-11-07 MED ORDER — PANTOPRAZOLE SODIUM 40 MG PO TBEC: 40.0000 mg | DELAYED_RELEASE_TABLET | Freq: Every day | ORAL | Status: DC

## 2014-11-07 MED ORDER — AMLODIPINE BESYLATE 10 MG PO TABS: 10.0000 mg | ORAL_TABLET | Freq: Every day | ORAL | Status: DC

## 2014-11-07 MED ORDER — ONETOUCH ULTRASOFT LANCETS MISC: 1.0000 | Status: DC | PRN

## 2014-11-07 MED ORDER — LOSARTAN POTASSIUM 100 MG PO TABS: 100.0000 mg | ORAL_TABLET | Freq: Every day | ORAL | Status: DC

## 2014-11-07 NOTE — Telephone Encounter (Signed)
Received call from [name] critical low value of 37, called pt she took her insulin but did not eat this AM due to fasting labs, she ate directly after. She has had no hypoglycemia symptoms. She did request refill on her lantus 90 day supply, takes 70 units   Continue current regimen

## 2014-11-07 NOTE — Progress Notes (Signed)
Subjective:    Patient ID: [name], female    DOB: April 13, 1975, 39 y.o.   MRN: [id number]  Diabetes  04/23/14 Please see my last office visit. The patient experienced monocular vision loss due to retinal hemorrhage secondary to her diabetes. She has underwent several surgeries to the ophthalmologist and has recovered some of the vision in her left eye. Unfortunately her diabetes remains uncontrolled. She is currently taking Levemir 38 units once daily and 7 units of NovoLog with meals according to her. Her most recent hemoglobin A1c was well above 10. I doubt that she is truly checking her sugars as often as she says that she is adamant that her fasting blood sugars are typically 160 and her 2 hour postprandial sugars are usually around 200. The main reason she is here today is because she has had a cough and wheezing and a productive cough now for more than 6 weeks. She was seen in December and treated for sinus infection but the cough continues to linger. Unfortunate she smokes and she also has underlying asthma. She's been using her Symbicort once daily. She denies any purulent mucus. She denies any fevers or chills. She does report some shortness of breath and wheezing.  At that time, my plan was: Patient has chronic bronchitis and I believe this is an evidence that she is developing COPD in addition to her asthma. I recommended her Symbicort twice daily as prescribed. Also strongly admonished her for smoking a recommended smoking cessation. I will also add Levaquin 500 mg by mouth daily for 7 days. Also recommended she increase her Levemir to 48 units subcutaneous daily and 7 units of NovoLog with meals. Recheck fasting blood sugars and two-hour postprandial sugars in one week so I can further titrate her insulin.  05/08/14 Patient is here today for follow-up. She has been very good about checking her blood sugars over the last week. Her fasting blood sugars typically range 130-150. Her two-hour  postprandial sugars typically range between 180 and 220-230. She is having no episodes of hypoglycemia no 48 units of Levemir in the morning and 7 units of NovoLog with meals in addition to the sliding scale correction.  Therefore typically she is giving herself anywhere between 7 units and 12 units with meals. Surprisingly her heart rate remains elevated at 118. She denies any anxiety. She has not used her inhalers this morning. She denies excessive caffeine use. She denies rapid weight loss fevers or hot flashes. She denies any bleeding or bruising.  At that time, my plan was:  Sinus tachycardia could certainly be due to adrenergic excess related to her breathing condition. However I will check a CBC to monitor for anemia. The patient does not appear to be clinically dehydrated. I will check a TSH to check for hyperthyroidism. If the lab work is normal, I will consider adding a cardiac specific beta blocker to try to help regulate her heart rate. We will need to monitor her breathing closely. I have asked the patient to increase her Levemir to 58 units in the morning. Continue 7 units of NovoLog with meals plus the correction factor. Recheck sugars in one week and further titrate. I anticipate at that time her fasting blood sugars will be normal. We may then have to increase the amount of mealtime insulin she is taking depending upon how resistant to insulin she is.  07/03/14 Patient presents today area she's been checking her blood pressure and her heart rate recently. Her heart rate  is consistently 100-130 even in the morning. She is not using albuterol. Her CBC and TSH were normal. Today she is in sinus tachycardia at 100 beats per minute on examination. She does have a history of asthma and chronic tobacco abuse. We have been hesitant to start the patient on a beta blocker for this reason but her heart rate gives [place] little choice. Currently the patient is on 70 units of Levemir every morning. She is not  taking the NovoLog as we prescribed it. Instead she is taking anywhere from 3-8 units with meals depending upon what she is eating. Basically she eyeballs it. Basically her fasting blood sugars are less than 120 and her two-hour postprandial sugars are under 180 according to her.  AT that time, my plan was: Start Coreg 6.25 mg by mouth twice a day. Recheck heart rate and blood pressure in 2 weeks and titrate medication to effect. Monitor for bronchospasms on carvedilol. Continue Levemir 70 units daily. Currently her blood sugars that she is reporting sound excellent. Therefore I will not make any changes in her insulin dosage.  11/07/14 Patient is here today for follow-up. Her hemoglobin A1c at her last office visit was excellent at 6.7. I was very proud of the patient for final each achieving blood sugar control through compliance. She states that her blood sugars now are typically ranging between 120 and 160. She denies any hypoglycemia. Unfortunately she has virtually no vision in her right eye due to her underlying diabetic retinopathy and vitreous hemorrhage. She is still seeing an eye specialist regularly monitoring her left eye. Her blood pressure today is well controlled 128/70. Her pneumonia vaccine is up-to-date. Diabetic foot exam is performed today and is normal. Patient denies any neuropathy burning tingling or pain in her feet. Unfortunately she continues to smoke and she has no desire to quit smoking at the present time. Past Medical History  Diagnosis Date  . Diabetes mellitus     19 years ago  . Muscle spasms of lower extremity     legs and weakness  . Hypercholesteremia   . Asthma   . Type 1 diabetes mellitus   . Hypertension   . GERD (gastroesophageal reflux disease)   . Diabetic retinopathy    Past Surgical History  Procedure Laterality Date  . Tubal ligation  2002  . Dilation and curettage of uterus  1997  . Wisdom tooth extraction      age 53  . Cesarean section  1993,  1998, 2002  . Laser photo ablation      left x 2, right x 1  . Pars plana vitrectomy Right 08/28/2014    Procedure: PARS PLANA VITRECTOMY WITH 25 GAUGE;  Surgeon: Hayden Pedro, MD;  Location: [facility];  Service: Ophthalmology;  Laterality: Right;  . Membrane peel Right 08/28/2014    Procedure: MEMBRANE PEEL;  Surgeon: Hayden Pedro, MD;  Location: [facility];  Service: Ophthalmology;  Laterality: Right;  . Laser photo ablation Right 08/28/2014    Procedure: LASER PHOTO ABLATION;  Surgeon: Hayden Pedro, MD;  Location: [facility];  Service: Ophthalmology;  Laterality: Right;  . Gas/fluid exchange Right 08/28/2014    Procedure: GAS/FLUID EXCHANGE;  Surgeon: Hayden Pedro, MD;  Location: [facility];  Service: Ophthalmology;  Laterality: Right;   Current Outpatient Prescriptions on File Prior to Visit  Medication Sig Dispense Refill  . amLODipine (NORVASC) 10 MG tablet TAKE ONE TABLET BY MOUTH ONCE DAILY 30 tablet 0  . BD  PEN NEEDLE NANO U/F 32G X 4 MM MISC USE AS DIRECTED 3 TIMES A DAY 100 each 9  . budesonide-formoterol (SYMBICORT) 160-4.5 MCG/ACT inhaler Inhale 2 puffs into the lungs 2 (two) times daily. 1 Inhaler 3  . carvedilol (COREG) 12.5 MG tablet TAKE 1 TABLET BY MOUTH 2  TIMES DAILY WITH A MEAL. 180 tablet 0  . gatifloxacin (ZYMAXID) 0.5 % SOLN Place 1 drop into the right eye 4 (four) times daily.    . insulin aspart (NOVOLOG FLEXPEN) 100 UNIT/ML FlexPen INJECT 3-8 UNITS INTO THE SKIN 3 (THREE) TIMES DAILY WITH MEALS. (Sliding Scale) 45 pen 3  . Insulin Glargine (LANTUS SOLOSTAR) 100 UNIT/ML Solostar Pen Inject 70 Units into the skin daily. 45 pen 3  . losartan (COZAAR) 100 MG tablet TAKE ONE TABLET BY MOUTH ONCE DAILY 30 tablet 3  . pantoprazole (PROTONIX) 40 MG tablet TAKE ONE TABLET BY MOUTH ONCE DAILY 30 tablet 0  . PROAIR HFA 108 (90 BASE) MCG/ACT inhaler 2 PUFFS EVERY 6 HOURS AS NEEDED (Patient taking differently: 2 PUFFS EVERY 6 HOURS AS NEEDED for shortness of breath) 8.5 each 3   No  current facility-administered medications on file prior to visit.   Allergies  Allergen Reactions  . Fish-Derived Products Hives and Shortness Of Breath  . Demerol Itching and Rash  . Morphine And Related Itching and Rash       . Zocor [Simvastatin] Itching and Other (See Comments)    Leg pain   Social History   Social History  . Marital Status: Married    Spouse Name: N/A  . Number of Children: N/A  . Years of Education: N/A   Occupational History  . Not on file.   Social History Main Topics  . Smoking status: Current Every Day Smoker -- 1.00 packs/day for 6 years    Types: Cigarettes  . Smokeless tobacco: Never Used  . Alcohol Use: Yes     Comment: 1-2 a year  . Drug Use: No  . Sexual Activity:    Partners: Male    Birth Control/ Protection: None, Surgical     Comment: tubal    Other Topics Concern  . Not on file   Social History Narrative      Review of Systems  All other systems reviewed and are negative.      Objective:   Physical Exam  Constitutional: She appears well-developed and well-nourished.  HENT:  Right Ear: External ear normal.  Left Ear: External ear normal.  Nose: Nose normal.  Mouth/Throat: Oropharynx is clear and moist. No oropharyngeal exudate.  Cardiovascular: Normal rate, regular rhythm and normal heart sounds.   Pulmonary/Chest: Effort normal. No respiratory distress. She has wheezes. She has no rales. She exhibits no tenderness.  Abdominal: Soft. Bowel sounds are normal.  Vitals reviewed.         Assessment & Plan:  Diabetes mellitus, insulin dependent (IDDM), controlled - Plan: COMPLETE METABOLIC PANEL WITH GFR, Lipid panel, Hemoglobin A1c, Microalbumin, urine  Sinus tachycardia  Severe nonproliferative diabetic retinopathy without macular edema associated with diabetes mellitus due to underlying condition  I am happy with her blood pressure. I am happy with her sugar control. I will check a hemoglobin A1c as well as a  fasting lipid panel. I will also check a urine microalbumin. I have strongly recommended smoking cessation but at the present time the patient has no desire to quit smoking. We can certainly do Chantix if she changes her mind. I've also recommended  a mammogram and a Pap smear every 3 years. She defers this at the present time

## 2014-11-07 NOTE — Addendum Note (Signed)
Addended by: Shary Decamp B on: 11/07/2014 10:36 AM   Modules accepted: Orders

## 2014-11-08 ENCOUNTER — Encounter (INDEPENDENT_AMBULATORY_CARE_PROVIDER_SITE_OTHER): Payer: Commercial Managed Care - HMO | Admitting: Ophthalmology

## 2014-11-08 DIAGNOSIS — H4312 Vitreous hemorrhage, left eye: Secondary | ICD-10-CM

## 2014-11-08 DIAGNOSIS — H35033 Hypertensive retinopathy, bilateral: Secondary | ICD-10-CM | POA: Diagnosis not present

## 2014-11-08 DIAGNOSIS — H43812 Vitreous degeneration, left eye: Secondary | ICD-10-CM

## 2014-11-08 DIAGNOSIS — E10359 Type 1 diabetes mellitus with proliferative diabetic retinopathy without macular edema: Secondary | ICD-10-CM | POA: Diagnosis not present

## 2014-11-08 DIAGNOSIS — E11319 Type 2 diabetes mellitus with unspecified diabetic retinopathy without macular edema: Secondary | ICD-10-CM | POA: Diagnosis not present

## 2014-11-08 DIAGNOSIS — I10 Essential (primary) hypertension: Secondary | ICD-10-CM | POA: Diagnosis not present

## 2014-11-08 DIAGNOSIS — H2513 Age-related nuclear cataract, bilateral: Secondary | ICD-10-CM | POA: Diagnosis not present

## 2014-11-08 LAB — MICROALBUMIN, URINE: Microalb, Ur: 0.9 mg/dL (ref <2.0)

## 2014-11-08 NOTE — Telephone Encounter (Signed)
This was refilled by Dr. Buelah Manis

## 2014-11-08 NOTE — Telephone Encounter (Signed)
Please refill lantus 90 day supply with 3 refills.

## 2014-11-09 ENCOUNTER — Ambulatory Visit: Payer: 59 | Admitting: Family Medicine

## 2014-11-13 ENCOUNTER — Other Ambulatory Visit: Payer: Self-pay | Admitting: Family Medicine

## 2014-11-13 MED ORDER — PRAVASTATIN SODIUM 40 MG PO TABS: 40.0000 mg | ORAL_TABLET | Freq: Every day | ORAL | Status: DC

## 2014-11-23 ENCOUNTER — Encounter (INDEPENDENT_AMBULATORY_CARE_PROVIDER_SITE_OTHER): Payer: Commercial Managed Care - HMO | Admitting: Ophthalmology

## 2014-11-23 DIAGNOSIS — H35033 Hypertensive retinopathy, bilateral: Secondary | ICD-10-CM

## 2014-11-23 DIAGNOSIS — H43813 Vitreous degeneration, bilateral: Secondary | ICD-10-CM | POA: Diagnosis not present

## 2014-11-23 DIAGNOSIS — I10 Essential (primary) hypertension: Secondary | ICD-10-CM | POA: Diagnosis not present

## 2014-11-23 DIAGNOSIS — H4312 Vitreous hemorrhage, left eye: Secondary | ICD-10-CM

## 2014-11-23 DIAGNOSIS — E10351 Type 1 diabetes mellitus with proliferative diabetic retinopathy with macular edema: Secondary | ICD-10-CM | POA: Diagnosis not present

## 2014-11-23 DIAGNOSIS — E10311 Type 1 diabetes mellitus with unspecified diabetic retinopathy with macular edema: Secondary | ICD-10-CM | POA: Diagnosis not present

## 2014-11-26 ENCOUNTER — Encounter (HOSPITAL_COMMUNITY): Payer: Self-pay | Admitting: *Deleted

## 2014-11-26 NOTE — Progress Notes (Signed)
[name] is a Type I diabetic.  Dr Dennard Schaumann with [facility] manages patient's diabetes.  Fasting CBG was 88 this am, A1C drawn on 11/07/14 was 6.6.   I called Dr Zigmund Daniel office for orders.

## 2014-11-27 ENCOUNTER — Ambulatory Visit (HOSPITAL_COMMUNITY)
Admission: RE | Admit: 2014-11-27 | Payer: Commercial Managed Care - HMO | Source: Ambulatory Visit | Admitting: Ophthalmology

## 2014-11-27 ENCOUNTER — Encounter (HOSPITAL_COMMUNITY): Payer: Self-pay | Admitting: Certified Registered Nurse Anesthetist

## 2014-11-27 HISTORY — DX: Headache, unspecified: R51.9

## 2014-11-27 HISTORY — DX: Adverse effect of unspecified anesthetic, initial encounter: T41.45XA

## 2014-11-27 HISTORY — DX: Headache: R51

## 2014-11-27 HISTORY — DX: Restless legs syndrome: G25.81

## 2014-11-27 SURGERY — PARS PLANA VITRECTOMY WITH 25 GAUGE
Anesthesia: General | Laterality: Left

## 2014-12-04 ENCOUNTER — Inpatient Hospital Stay (INDEPENDENT_AMBULATORY_CARE_PROVIDER_SITE_OTHER): Payer: Worker's Compensation | Admitting: Ophthalmology

## 2014-12-06 ENCOUNTER — Encounter (INDEPENDENT_AMBULATORY_CARE_PROVIDER_SITE_OTHER): Payer: 59 | Admitting: Ophthalmology

## 2014-12-14 ENCOUNTER — Ambulatory Visit (INDEPENDENT_AMBULATORY_CARE_PROVIDER_SITE_OTHER): Payer: 59 | Admitting: Ophthalmology

## 2014-12-17 ENCOUNTER — Other Ambulatory Visit: Payer: Self-pay | Admitting: Ophthalmology

## 2014-12-20 ENCOUNTER — Encounter (HOSPITAL_COMMUNITY): Payer: Self-pay | Admitting: *Deleted

## 2014-12-20 NOTE — Progress Notes (Signed)
Pt denies SOB, chest pain, and being under the care of a cardiologist. Pt denies having a stress test , echo and cardiac cath. Pt denies having a chest x ray within the last year. Pt stated that she was instructed by MD to take all regular morning medications except insulin on the morning of surgery " since he is not putting me to sleeping, he is just numbing me." Pt made aware to stop taking Aspirin, otc vitamins and herbal medications. Do not take any NSAIDs ie: Ibuprofen, Advil, Naproxen or any medication containing Aspirin. Pt verbalized understanding of all pre-op instructions.

## 2014-12-21 ENCOUNTER — Encounter: Payer: Self-pay | Admitting: Family Medicine

## 2014-12-21 ENCOUNTER — Ambulatory Visit (INDEPENDENT_AMBULATORY_CARE_PROVIDER_SITE_OTHER): Payer: Commercial Managed Care - HMO | Admitting: Family Medicine

## 2014-12-21 VITALS — BP 110/60 | HR 94 | Temp 99.0°F | Resp 16 | Ht 63.0 in | Wt 180.0 lb

## 2014-12-21 DIAGNOSIS — M25512 Pain in left shoulder: Secondary | ICD-10-CM | POA: Diagnosis not present

## 2014-12-21 NOTE — Progress Notes (Signed)
Subjective:    Patient ID: [name], female    DOB: 09/12/1975, 39 y.o.   MRN: [id number]  HPI 04/23/14 Please see my last office visit. The patient experienced monocular vision loss due to retinal hemorrhage secondary to her diabetes. She has underwent several surgeries to the ophthalmologist and has recovered some of the vision in her left eye. Unfortunately her diabetes remains uncontrolled. She is currently taking Levemir 38 units once daily and 7 units of NovoLog with meals according to her. Her most recent hemoglobin A1c was well above 10. I doubt that she is truly checking her sugars as often as she says that she is adamant that her fasting blood sugars are typically 160 and her 2 hour postprandial sugars are usually around 200. The main reason she is here today is because she has had a cough and wheezing and a productive cough now for more than 6 weeks. She was seen in December and treated for sinus infection but the cough continues to linger. Unfortunate she smokes and she also has underlying asthma. She's been using her Symbicort once daily. She denies any purulent mucus. She denies any fevers or chills. She does report some shortness of breath and wheezing.  At that time, my plan was: Patient has chronic bronchitis and I believe this is an evidence that she is developing COPD in addition to her asthma. I recommended her Symbicort twice daily as prescribed. Also strongly admonished her for smoking a recommended smoking cessation. I will also add Levaquin 500 mg by mouth daily for 7 days. Also recommended she increase her Levemir to 48 units subcutaneous daily and 7 units of NovoLog with meals. Recheck fasting blood sugars and two-hour postprandial sugars in one week so I can further titrate her insulin.  05/08/14 Patient is here today for follow-up. She has been very good about checking her blood sugars over the last week. Her fasting blood sugars typically range 130-150. Her two-hour  postprandial sugars typically range between 180 and 220-230. She is having no episodes of hypoglycemia no 48 units of Levemir in the morning and 7 units of NovoLog with meals in addition to the sliding scale correction.  Therefore typically she is giving herself anywhere between 7 units and 12 units with meals. Surprisingly her heart rate remains elevated at 118. She denies any anxiety. She has not used her inhalers this morning. She denies excessive caffeine use. She denies rapid weight loss fevers or hot flashes. She denies any bleeding or bruising.  At that time, my plan was:  Sinus tachycardia could certainly be due to adrenergic excess related to her breathing condition. However I will check a CBC to monitor for anemia. The patient does not appear to be clinically dehydrated. I will check a TSH to check for hyperthyroidism. If the lab work is normal, I will consider adding a cardiac specific beta blocker to try to help regulate her heart rate. We will need to monitor her breathing closely. I have asked the patient to increase her Levemir to 58 units in the morning. Continue 7 units of NovoLog with meals plus the correction factor. Recheck sugars in one week and further titrate. I anticipate at that time her fasting blood sugars will be normal. We may then have to increase the amount of mealtime insulin she is taking depending upon how resistant to insulin she is.  07/03/14 Patient presents today area she's been checking her blood pressure and her heart rate recently. Her heart rate is  consistently 100-130 even in the morning. She is not using albuterol. Her CBC and TSH were normal. Today she is in sinus tachycardia at 100 beats per minute on examination. She does have a history of asthma and chronic tobacco abuse. We have been hesitant to start the patient on a beta blocker for this reason but her heart rate gives [place] little choice. Currently the patient is on 70 units of Levemir every morning. She is not  taking the NovoLog as we prescribed it. Instead she is taking anywhere from 3-8 units with meals depending upon what she is eating. Basically she eyeballs it. Basically her fasting blood sugars are less than 120 and her two-hour postprandial sugars are under 180 according to her.  AT that time, my plan was: Start Coreg 6.25 mg by mouth twice a day. Recheck heart rate and blood pressure in 2 weeks and titrate medication to effect. Monitor for bronchospasms on carvedilol. Continue Levemir 70 units daily. Currently her blood sugars that she is reporting sound excellent. Therefore I will not make any changes in her insulin dosage.  11/07/14 Patient is here today for follow-up. Her hemoglobin A1c at her last office visit was excellent at 6.7. I was very proud of the patient for final each achieving blood sugar control through compliance. She states that her blood sugars now are typically ranging between 120 and 160. She denies any hypoglycemia. Unfortunately she has virtually no vision in her right eye due to her underlying diabetic retinopathy and vitreous hemorrhage. She is still seeing an eye specialist regularly monitoring her left eye. Her blood pressure today is well controlled 128/70. Her pneumonia vaccine is up-to-date. Diabetic foot exam is performed today and is normal. Patient denies any neuropathy burning tingling or pain in her feet. Unfortunately she continues to smoke and she has no desire to quit smoking at the present time.  AT that time, my plan was: I am happy with her blood pressure. I am happy with her sugar control. I will check a hemoglobin A1c as well as a fasting lipid panel. I will also check a urine microalbumin. I have strongly recommended smoking cessation but at the present time the patient has no desire to quit smoking. We can certainly do Chantix if she changes her mind. I've also recommended a mammogram and a Pap smear every 3 years. She defers this at the present  time  12/21/14 Patient presents with 2 weeks of pain in her left arm from her left shoulder all the way down her arm to her left wrist. History is complicated by the fact that she has chronic carpal tunnel syndrome in the left wrist which causes pain in the left hand left wrist and left forearm. Some of this pain may be chronic. The majority of her pain is centered in her left shoulder. She is unable to abduct arm greater than 80. She has significant pain with passive abduction greater than 90. She has a positive empty can sign. She has a positive Hawkins sign. She has a negative Spurling sign. She has normal reflexes. She does have decreased grip strength in her hand but this appears to be chronic from her carpal tunnel. Past Medical History  Diagnosis Date  . Diabetes mellitus     19 years ago  . Muscle spasms of lower extremity     legs and weakness  . Hypercholesteremia   . Asthma   . Type 1 diabetes mellitus   . Hypertension   . GERD (  gastroesophageal reflux disease)   . Diabetic retinopathy   . Complication of anesthesia 07/2014    Mouth was swollen on inside, Front tooth was chipped  . Headache     Migraine- a long time ago  . Restless leg   . Vitreous hemorrhage, left eye   . Pneumonia   . Carpal tunnel syndrome on left   . Diabetic neuropathy    Past Surgical History  Procedure Laterality Date  . Tubal ligation  2002  . Dilation and curettage of uterus  1997  . Wisdom tooth extraction      age 40  . Cesarean section  1993, 1998, 2002  . Laser photo ablation      left x 2, right x 1  . Pars plana vitrectomy Right 08/28/2014    Procedure: PARS PLANA VITRECTOMY WITH 25 GAUGE;  Surgeon: Hayden Pedro, MD;  Location: [facility];  Service: Ophthalmology;  Laterality: Right;  . Membrane peel Right 08/28/2014    Procedure: MEMBRANE PEEL;  Surgeon: Hayden Pedro, MD;  Location: [facility];  Service: Ophthalmology;  Laterality: Right;  . Laser photo ablation Right 08/28/2014    Procedure:  LASER PHOTO ABLATION;  Surgeon: Hayden Pedro, MD;  Location: [facility];  Service: Ophthalmology;  Laterality: Right;  . Gas/fluid exchange Right 08/28/2014    Procedure: GAS/FLUID EXCHANGE;  Surgeon: Hayden Pedro, MD;  Location: [facility];  Service: Ophthalmology;  Laterality: Right;   Current Outpatient Prescriptions on File Prior to Visit  Medication Sig Dispense Refill  . amLODipine (NORVASC) 10 MG tablet Take 1 tablet (10 mg total) by mouth daily. 90 tablet 3  . BD PEN NEEDLE NANO U/F 32G X 4 MM MISC USE AS DIRECTED 3 TIMES A DAY 100 each 9  . budesonide-formoterol (SYMBICORT) 160-4.5 MCG/ACT inhaler Inhale 2 puffs into the lungs 2 (two) times daily. 1 Inhaler 3  . carvedilol (COREG) 12.5 MG tablet TAKE 1 TABLET BY MOUTH 2  TIMES DAILY WITH A MEAL. 180 tablet 0  . glucose blood (ONETOUCH VERIO) test strip Check BS tid - qid 100 each 11  . insulin aspart (NOVOLOG FLEXPEN) 100 UNIT/ML FlexPen INJECT 3-8 UNITS INTO THE SKIN 3 (THREE) TIMES DAILY WITH MEALS. (Sliding Scale) (Patient taking differently: Inject 3-8 Units into the skin 3 (three) times daily with meals. If blood sugar is under 100 before a full meal take 3 units, if 140 or higher before a full meal take 8 units) 45 pen 3  . Insulin Glargine (LANTUS SOLOSTAR) 100 UNIT/ML Solostar Pen Inject 70 Units into the skin daily. Give 90 day supply (Patient taking differently: Inject 70 Units into the skin daily. Give 90 day supply) 45 pen 3  . Lancets (ONETOUCH ULTRASOFT) lancets 1 each by Other route as needed for other. Use as instructed 100 each 11  . losartan (COZAAR) 100 MG tablet Take 1 tablet (100 mg total) by mouth daily. 90 tablet 3  . OVER THE COUNTER MEDICATION Take 2 tablets by mouth at bedtime. Restless leg relief    . pantoprazole (PROTONIX) 40 MG tablet Take 1 tablet (40 mg total) by mouth daily. 90 tablet 3  . pravastatin (PRAVACHOL) 40 MG tablet Take 1 tablet (40 mg total) by mouth daily. 90 tablet 3  . PROAIR HFA 108 (90 BASE)  MCG/ACT inhaler 2 PUFFS EVERY 6 HOURS AS NEEDED (Patient taking differently: 2 PUFFS EVERY 6 HOURS AS NEEDED for shortness of breath) 8.5 each 3   No current facility-administered  medications on file prior to visit.   Allergies  Allergen Reactions  . Fish-Derived Products Hives and Shortness Of Breath  . Demerol Itching and Rash  . Morphine And Related Itching and Rash       . Zocor [Simvastatin] Itching and Other (See Comments)    Leg pain   Social History   Social History  . Marital Status: Married    Spouse Name: N/A  . Number of Children: N/A  . Years of Education: N/A   Occupational History  . Not on file.   Social History Main Topics  . Smoking status: Current Every Day Smoker -- 1.00 packs/day for 6 years    Types: Cigarettes  . Smokeless tobacco: Never Used  . Alcohol Use: No  . Drug Use: No  . Sexual Activity:    Partners: Male    Birth Control/ Protection: None, Surgical     Comment: tubal    Other Topics Concern  . Not on file   Social History Narrative      Review of Systems  All other systems reviewed and are negative.      Objective:   Physical Exam  Constitutional: She appears well-developed and well-nourished.  HENT:  Right Ear: External ear normal.  Left Ear: External ear normal.  Cardiovascular: Normal rate, regular rhythm and normal heart sounds.   Pulmonary/Chest: Effort normal. No respiratory distress. She has no wheezes. She has no rales. She exhibits no tenderness.  Vitals reviewed.    See history of present illness for a more thorough exam description     Assessment & Plan:  Shoulder pain, left  Based on her history and her physical exam I do not believe that this is a cervical radiculopathy. Instead I believe she has impingement syndrome in her left shoulder. I gave the patient option of an MRI of the left shoulder versus nerve conduction studies to rule out  Cervical radiculopathy. Patient would like to try a cortisone  injection in the left shoulder first. Using sterile technique, I injected the left shoulder with a mixture of 2 mL of lidocaine, 2 mL of Marcaine, 2 mL of 40 mg per mL Kenalog. The patient tolerated the procedure well without complication. Follow-up next week if no better or sooner if worse.  On examination today, the patient has noticeably diminished grip strength in her left hand where she is been dealing with chronic carpal tunnel. I explained to the patient that she is developing weakness in that hand she can be doing permanent damage to the median nerve by waiting. I believe she will benefit from seeing a hand specialist for her carpal tunnel. I have asked her to call me back next week. If her shoulder pain has improved dramatically, I would like to schedule the patient to see an orthopedist to discuss definitive treatment for carpal tunnel syndrome

## 2014-12-24 ENCOUNTER — Ambulatory Visit (HOSPITAL_COMMUNITY)
Admission: RE | Admit: 2014-12-24 | Discharge: 2014-12-24 | Disposition: A | Discharge status: Home / Self Care | Payer: Commercial Managed Care - HMO | Source: Ambulatory Visit | Attending: Ophthalmology | Admitting: Ophthalmology

## 2014-12-24 ENCOUNTER — Encounter (HOSPITAL_COMMUNITY)
Admission: RE | Disposition: A | Discharge status: Home / Self Care | Payer: Self-pay | Source: Ambulatory Visit | Attending: Ophthalmology

## 2014-12-24 ENCOUNTER — Encounter (HOSPITAL_COMMUNITY): Payer: Self-pay | Admitting: General Practice

## 2014-12-24 ENCOUNTER — Ambulatory Visit (HOSPITAL_COMMUNITY): Payer: Commercial Managed Care - HMO | Admitting: Certified Registered Nurse Anesthetist

## 2014-12-24 DIAGNOSIS — H4312 Vitreous hemorrhage, left eye: Secondary | ICD-10-CM | POA: Insufficient documentation

## 2014-12-24 DIAGNOSIS — J45909 Unspecified asthma, uncomplicated: Secondary | ICD-10-CM | POA: Diagnosis not present

## 2014-12-24 DIAGNOSIS — Z794 Long term (current) use of insulin: Secondary | ICD-10-CM | POA: Insufficient documentation

## 2014-12-24 DIAGNOSIS — I10 Essential (primary) hypertension: Secondary | ICD-10-CM | POA: Diagnosis not present

## 2014-12-24 DIAGNOSIS — E10359 Type 1 diabetes mellitus with proliferative diabetic retinopathy without macular edema: Secondary | ICD-10-CM | POA: Insufficient documentation

## 2014-12-24 DIAGNOSIS — Z79899 Other long term (current) drug therapy: Secondary | ICD-10-CM | POA: Insufficient documentation

## 2014-12-24 DIAGNOSIS — F17200 Nicotine dependence, unspecified, uncomplicated: Secondary | ICD-10-CM | POA: Diagnosis not present

## 2014-12-24 HISTORY — DX: Carpal tunnel syndrome, left upper limb: G56.02

## 2014-12-24 HISTORY — DX: Type 2 diabetes mellitus with diabetic neuropathy, unspecified: E11.40

## 2014-12-24 HISTORY — PX: PARS PLANA VITRECTOMY: SHX2166

## 2014-12-24 HISTORY — DX: Pneumonia, unspecified organism: J18.9

## 2014-12-24 HISTORY — DX: Vitreous hemorrhage, left eye: H43.12

## 2014-12-24 LAB — BASIC METABOLIC PANEL
Anion gap: 9 (ref 5–15)
BUN: 14 mg/dL (ref 6–20)
CHLORIDE: 104 mmol/L (ref 101–111)
CO2: 25 mmol/L (ref 22–32)
CREATININE: 0.75 mg/dL (ref 0.44–1.00)
Calcium: 9.6 mg/dL (ref 8.9–10.3)
GFR calc Af Amer: >60 mL/min (ref >60)
GFR calc non Af Amer: >60 mL/min (ref >60)
GLUCOSE: 145 mg/dL — AB (ref 65–99)
Potassium: 3.8 mmol/L (ref 3.5–5.1)
Sodium: 138 mmol/L (ref 135–145)

## 2014-12-24 LAB — CBC
HCT: 44 % (ref 36.0–46.0)
Hemoglobin: 15.4 g/dL — ABNORMAL HIGH (ref 12.0–15.0)
MCH: 31 pg (ref 26.0–34.0)
MCHC: 35 g/dL (ref 30.0–36.0)
MCV: 88.5 fL (ref 78.0–100.0)
PLATELETS: 205 10*3/uL (ref 150–400)
RBC: 4.97 MIL/uL (ref 3.87–5.11)
RDW: 12.5 % (ref 11.5–15.5)
WBC: 12.2 10*3/uL — ABNORMAL HIGH (ref 4.0–10.5)

## 2014-12-24 LAB — GLUCOSE, CAPILLARY
GLUCOSE-CAPILLARY: 122 mg/dL — AB (ref 65–99)
GLUCOSE-CAPILLARY: 115 mg/dL — AB (ref 65–99)
Glucose-Capillary: 169 mg/dL — ABNORMAL HIGH (ref 65–99)

## 2014-12-24 LAB — HCG, SERUM, QUALITATIVE: PREG SERUM: NEGATIVE

## 2014-12-24 SURGERY — PARS PLANA VITRECTOMY WITH 25 GAUGE
Anesthesia: Monitor Anesthesia Care | Site: Eye | Laterality: Left

## 2014-12-24 MED ORDER — EPINEPHRINE HCL 1 MG/ML IJ SOLN
INTRAMUSCULAR | Status: AC
  Filled 2014-12-24: qty 1

## 2014-12-24 MED ORDER — SODIUM CHLORIDE 0.9 % IJ SOLN
INTRAMUSCULAR | Status: AC
  Filled 2014-12-24: qty 10

## 2014-12-24 MED ORDER — HYPROMELLOSE (GONIOSCOPIC) 2.5 % OP SOLN
OPHTHALMIC | Status: AC
  Filled 2014-12-24: qty 15

## 2014-12-24 MED ORDER — FENTANYL CITRATE (PF) 250 MCG/5ML IJ SOLN
INTRAMUSCULAR | Status: AC
  Filled 2014-12-24: qty 5

## 2014-12-24 MED ORDER — PHENYLEPHRINE HCL 2.5 % OP SOLN
1.0000 [drp] | OPHTHALMIC | Status: AC | PRN
  Administered 2014-12-24 (×3): 1 [drp] via OPHTHALMIC
  Filled 2014-12-24: qty 2

## 2014-12-24 MED ORDER — HYPROMELLOSE (GONIOSCOPIC) 2.5 % OP SOLN
OPHTHALMIC | Status: DC | PRN
  Administered 2014-12-24: 1 [drp] via OPHTHALMIC

## 2014-12-24 MED ORDER — CYCLOPENTOLATE HCL 1 % OP SOLN
1.0000 [drp] | OPHTHALMIC | Status: AC | PRN
  Administered 2014-12-24 (×3): 1 [drp] via OPHTHALMIC
  Filled 2014-12-24: qty 2

## 2014-12-24 MED ORDER — DEXAMETHASONE SODIUM PHOSPHATE 10 MG/ML IJ SOLN
INTRAMUSCULAR | Status: DC | PRN
  Administered 2014-12-24: 10 mg

## 2014-12-24 MED ORDER — GENTAMICIN SULFATE 40 MG/ML IJ SOLN
INTRAMUSCULAR | Status: AC
  Filled 2014-12-24: qty 2

## 2014-12-24 MED ORDER — POLYMYXIN B SULFATE 500000 UNITS IJ SOLR
INTRAMUSCULAR | Status: AC
  Filled 2014-12-24: qty 1

## 2014-12-24 MED ORDER — SODIUM HYALURONATE 10 MG/ML IO SOLN
INTRAOCULAR | Status: AC
  Filled 2014-12-24: qty 0.85

## 2014-12-24 MED ORDER — LIDOCAINE HCL 2 % IJ SOLN
INTRAMUSCULAR | Status: AC
  Filled 2014-12-24: qty 20

## 2014-12-24 MED ORDER — DEXAMETHASONE SODIUM PHOSPHATE 10 MG/ML IJ SOLN
INTRAMUSCULAR | Status: AC
  Filled 2014-12-24: qty 1

## 2014-12-24 MED ORDER — MEPERIDINE HCL 25 MG/ML IJ SOLN: 6.2500 mg | INTRAMUSCULAR | Status: DC | PRN

## 2014-12-24 MED ORDER — FENTANYL CITRATE (PF) 100 MCG/2ML IJ SOLN
25.0000 ug | INTRAMUSCULAR | Status: DC | PRN
  Administered 2014-12-24: 50 ug via INTRAVENOUS

## 2014-12-24 MED ORDER — NA CHONDROIT SULF-NA HYALURON 40-30 MG/ML IO SOLN
INTRAOCULAR | Status: AC
  Filled 2014-12-24: qty 0.5

## 2014-12-24 MED ORDER — SODIUM CHLORIDE 0.9 % IV SOLN
INTRAVENOUS | Status: DC
  Administered 2014-12-24 (×2): via INTRAVENOUS

## 2014-12-24 MED ORDER — MIDAZOLAM HCL 2 MG/2ML IJ SOLN
INTRAMUSCULAR | Status: AC
  Filled 2014-12-24: qty 4

## 2014-12-24 MED ORDER — BSS PLUS IO SOLN
INTRAOCULAR | Status: AC
  Filled 2014-12-24: qty 500

## 2014-12-24 MED ORDER — BACITRACIN-POLYMYXIN B 500-10000 UNIT/GM OP OINT
TOPICAL_OINTMENT | OPHTHALMIC | Status: DC | PRN
  Administered 2014-12-24: 1 via OPHTHALMIC

## 2014-12-24 MED ORDER — FENTANYL CITRATE (PF) 100 MCG/2ML IJ SOLN
INTRAMUSCULAR | Status: AC
  Filled 2014-12-24: qty 2

## 2014-12-24 MED ORDER — LIDOCAINE HCL (CARDIAC) 20 MG/ML IV SOLN
INTRAVENOUS | Status: DC | PRN
  Administered 2014-12-24: 40 mg via INTRAVENOUS

## 2014-12-24 MED ORDER — PROMETHAZINE HCL 25 MG/ML IJ SOLN: 6.2500 mg | INTRAMUSCULAR | Status: DC | PRN

## 2014-12-24 MED ORDER — TETRACAINE HCL 0.5 % OP SOLN
OPHTHALMIC | Status: DC | PRN
  Administered 2014-12-24: 2 [drp] via OPHTHALMIC

## 2014-12-24 MED ORDER — PROPOFOL 10 MG/ML IV BOLUS
INTRAVENOUS | Status: DC | PRN
  Administered 2014-12-24: 40 mg via INTRAVENOUS

## 2014-12-24 MED ORDER — GATIFLOXACIN 0.5 % OP SOLN
1.0000 [drp] | OPHTHALMIC | Status: AC | PRN
  Administered 2014-12-24 (×3): 1 [drp] via OPHTHALMIC
  Filled 2014-12-24: qty 2.5

## 2014-12-24 MED ORDER — LIDOCAINE HCL 2 % IJ SOLN
INTRAMUSCULAR | Status: DC | PRN
  Administered 2014-12-24: 20 mL
  Administered 2014-12-24: 10 mL

## 2014-12-24 MED ORDER — BACITRACIN-POLYMYXIN B 500-10000 UNIT/GM OP OINT
TOPICAL_OINTMENT | OPHTHALMIC | Status: AC
  Filled 2014-12-24: qty 3.5

## 2014-12-24 MED ORDER — TETRACAINE HCL 0.5 % OP SOLN
OPHTHALMIC | Status: AC
  Filled 2014-12-24: qty 2

## 2014-12-24 MED ORDER — PROPOFOL 500 MG/50ML IV EMUL
INTRAVENOUS | Status: DC | PRN
  Administered 2014-12-24: 25 ug/kg/min via INTRAVENOUS

## 2014-12-24 MED ORDER — EPINEPHRINE HCL 1 MG/ML IJ SOLN
INTRAOCULAR | Status: DC | PRN
  Administered 2014-12-24: 12:00:00

## 2014-12-24 MED ORDER — MIDAZOLAM HCL 5 MG/5ML IJ SOLN
INTRAMUSCULAR | Status: DC | PRN
  Administered 2014-12-24: 2 mg via INTRAVENOUS

## 2014-12-24 MED ORDER — BSS IO SOLN
INTRAOCULAR | Status: DC | PRN
  Administered 2014-12-24: 15 mL via INTRAOCULAR

## 2014-12-24 SURGICAL SUPPLY — 59 items
APPLICATOR COTTON TIP 6IN STRL (MISCELLANEOUS) ×2 IMPLANT
APPLICATOR DR MATTHEWS STRL (MISCELLANEOUS) IMPLANT
BLADE MVR KNIFE 20G (BLADE) IMPLANT
CANNULA ANT CHAM MAIN (OPHTHALMIC RELATED) IMPLANT
CANNULA VLV SOFT TIP 25GA (OPHTHALMIC) ×2 IMPLANT
CORDS BIPOLAR (ELECTRODE) IMPLANT
COVER MAYO STAND STRL (DRAPES) IMPLANT
DRAPE INCISE 51X51 W/FILM STRL (DRAPES) IMPLANT
DRAPE OPHTHALMIC 77X100 STRL (CUSTOM PROCEDURE TRAY) ×2 IMPLANT
DRAPE POUCH INSTRU U-SHP 10X18 (DRAPES) ×2 IMPLANT
FILTER BLUE MILLIPORE (MISCELLANEOUS) IMPLANT
FORCEPS ECKARDT ILM 25G SERR (OPHTHALMIC RELATED) IMPLANT
FORCEPS GRIESHABER ILM 25G A (INSTRUMENTS) IMPLANT
FORCEPS HORIZONTAL 25G DISP (OPHTHALMIC RELATED) IMPLANT
GAS AUTO FILL CONSTEL (OPHTHALMIC)
GAS AUTO FILL CONSTELLATION (OPHTHALMIC) IMPLANT
GLOVE BIOGEL PI IND STRL 7.5 (GLOVE) ×1 IMPLANT
GLOVE BIOGEL PI IND STRL 8.5 (GLOVE) ×1 IMPLANT
GLOVE BIOGEL PI INDICATOR 7.5 (GLOVE) ×1
GLOVE BIOGEL PI INDICATOR 8.5 (GLOVE) ×1
GLOVE SS BIOGEL STRL SZ 8.5 (GLOVE) ×1 IMPLANT
GLOVE SUPERSENSE BIOGEL SZ 8.5 (GLOVE) ×1
GLOVE SURG SS PI 8.5 STRL IVOR (GLOVE) ×1
GLOVE SURG SS PI 8.5 STRL STRW (GLOVE) ×1 IMPLANT
GOWN STRL REUS W/ TWL LRG LVL3 (GOWN DISPOSABLE) ×1 IMPLANT
GOWN STRL REUS W/ TWL XL LVL3 (GOWN DISPOSABLE) ×1 IMPLANT
GOWN STRL REUS W/TWL LRG LVL3 (GOWN DISPOSABLE) ×1
GOWN STRL REUS W/TWL XL LVL3 (GOWN DISPOSABLE) ×1
KIT BASIN OR (CUSTOM PROCEDURE TRAY) ×2 IMPLANT
KNIFE CRESCENT 2.5 55 ANG (BLADE) IMPLANT
LENS BIOM SUPER VIEW SET DISP (OPHTHALMIC RELATED) ×2 IMPLANT
MICROPICK 25G (MISCELLANEOUS)
NEEDLE 18GX1X1/2 (RX/OR ONLY) (NEEDLE) IMPLANT
NEEDLE 25GX 5/8IN NON SAFETY (NEEDLE) IMPLANT
NEEDLE FILTER BLUNT 18X 1/2SAF (NEEDLE)
NEEDLE FILTER BLUNT 18X1 1/2 (NEEDLE) IMPLANT
NEEDLE HYPO 25GX1X1/2 BEV (NEEDLE) IMPLANT
NS IRRIG 1000ML POUR BTL (IV SOLUTION) ×2 IMPLANT
PACK FRAGMATOME (OPHTHALMIC) IMPLANT
PACK VITRECTOMY CUSTOM (CUSTOM PROCEDURE TRAY) ×2 IMPLANT
PAD ARMBOARD 7.5X6 YLW CONV (MISCELLANEOUS) ×4 IMPLANT
PAK PIK VITRECTOMY CVS 25GA (OPHTHALMIC) ×2 IMPLANT
PENCIL BIPOLAR 25GA STR DISP (OPHTHALMIC RELATED) IMPLANT
PICK MICROPICK 25G (MISCELLANEOUS) IMPLANT
PROBE LASER ILLUM FLEX CVD 25G (OPHTHALMIC) IMPLANT
ROLLS DENTAL (MISCELLANEOUS) IMPLANT
SCRAPER DIAMOND 25GA (OPHTHALMIC RELATED) IMPLANT
SET INJECTOR OIL FLUID CONSTEL (OPHTHALMIC) IMPLANT
STOCKINETTE IMPERVIOUS 9X36 MD (GAUZE/BANDAGES/DRESSINGS) ×4 IMPLANT
STOPCOCK 4 WAY LG BORE MALE ST (IV SETS) IMPLANT
SUT ETHILON 10 0 CS140 6 (SUTURE) IMPLANT
SUT ETHILON 8 0 BV130 4 (SUTURE) IMPLANT
SUT MERSILENE 5 0 RD 1 DA (SUTURE) IMPLANT
SUT PROLENE 10 0 CIF 4 DA (SUTURE) IMPLANT
SYR 30ML SLIP (SYRINGE) IMPLANT
SYR 5ML LL (SYRINGE) IMPLANT
SYR TB 1ML LUER SLIP (SYRINGE) IMPLANT
WATER STERILE IRR 1000ML POUR (IV SOLUTION) ×2 IMPLANT
WIPE INSTRUMENT VISIWIPE 73X73 (MISCELLANEOUS) IMPLANT

## 2014-12-24 NOTE — Transfer of Care (Signed)
Immediate Anesthesia Transfer of Care Note  Patient: [name]  Procedure(s) Performed: Procedure(s): LEFT PARS PLANA VITRECTOMY WITH 25 GAUGE WITH ENDOLASER  (Left)  Patient Location: PACU  Anesthesia Type:MAC  Level of Consciousness: awake, alert  and oriented  Airway & Oxygen Therapy: Patient Spontanous Breathing  Post-op Assessment: Report given to RN and Post -op Vital signs reviewed and stable  Post vital signs: Reviewed and stable  Last Vitals:  Filed Vitals:   12/24/14 0938  BP: 129/76  Pulse: 91  Temp: 37 C  Resp: 18    Complications: No apparent anesthesia complications

## 2014-12-24 NOTE — Op Note (Signed)
Preoperative diagnosis; 1. Dense vitreous hemorrhage left eye, nonclearing  2. Progressive proliferative diabetic retinopathy despite previous panretinal photocoagulation left eye.  Postoperative diagnosis: 1-2,, the same  Procedure: 1. Posterior vitrectomy left eye with panretinal endolaser left eye  Anesthesia local retrobulbar with monitored anesthesia control  Surgeon: Hurman Horn M.D.  Indication for procedure patient is 39 year old woman with type 1 diabetes mellitus long-standing with vision loss of the right eye and the basis of optic atrophy present with some role of diabetic retinopathy previous surgery in the past by surgeon elsewhere. Dense nonclearing vitreous hemorrhage left eye which hampers her activities daily living including driving and reading. Patient says attempt to clear the media opacification but also to induce glasses of diabetic retinopathy left eye. Chest and risk of anesthesia including the rare occurrence of death but also to the eye from underlying condition including but limited to hemorrhage, infection, scarring, need another surgery, no change of vision, loss of vision and progression of disease despite intervention.  surgical timeout was carried out. Signed consent was obtained patient taken the operating room for an operating room probe monitoring was, mild sedation. Appropriate site selected confirmed as a left eye with surgical staff and surgeon thereafter no mild sedation 2% Xylocaine 5 mL injected retrobulbar with additional 5 mL laterally fashion modified Kirk Ruths. Surgical timeout carried out. [Region was then sterilely prepped and draped in the usual ophthalmic fashion. Lid speculum applied. Surgical timeout repeated. 25 Gauge trocar placed in the inferotemporal quadrant left eye or timeout been previously and second the second time repeated. Surgical trocar placed interval quadrant patient verified visually infusion turned on peers per trochars applied.  Cortectomy was begun. Posterior hyaloid peripheral hilar was entered inferiorly and peripheral vitreous can was elevated off the optic nerve posteriorly from an anterior to posterior dissection technique of this is carried out atraumatically. Posterior hyaloid was elevated off the optic nerve macular region and finally superiorly and finally circumcised 360. The past case was cleared in this fashion. The visual axis was cleared. No comp cases occurred. Endolaser photo regulation placed posteriorly as well as in the anterior rim of left of retina.  No comp cases occurred. Intravitreal the eye. Spear trochars removed the globe wounds are secure. Infusion removed. Subconjunctival Decadron applied. Sterile patch function applied. Patient tolerated procedure well without complication. Patient name PACU hemodynamically stable. Patient was discharged home as an outpatient see the office tomorrow. Dr. Zadie Rhine

## 2014-12-24 NOTE — H&P (Signed)
[age]-year-old woman with diabetes mellitus, diabetic retinopathy progressive despite panretinal photocoagulation now with vision loss of the left eye hampering activities of daily living.  Visionin the right eye is in poor for years and etiology is unknown to the patient. She has a past history of excellent vision in each eye from the onset as a youth.  Past medical history multiple laser retinal procedures at each eye as well as a prior vitrectomy in the right eye by Dr. Zigmund Daniel.  Prior surgeries past surgical history cesarean section past his illnesses reviewed. Medications listed in the chart. He is oriented alert oriented today 3. Vision right eye is count fingers at 1 feet left eye is 20/40 +2. Patient's complaints of numerous floaters which chamfer activities of daily living and able to function numbness current well without with the left eye and dysfunction. She forces been no change today as prior to that as compared to the prior recent office examination. 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 Proliferative diabetic retinopathy in the right eye is otherwise quiesced sent.  Funduscopic examination left eye discloses that there is significant preretinal vitreous hemorrhage dispersed with glimpses of the macular region through this area. There is good panretinal photic regulation peripherally but there is room for further endolaser.  Impression 1 dense nonclearing vitreous hemorrhage left eye despite previous panretinal photic regulation with impairment of activities of daily living left eye. Patient says [place] in attempt to clear the vitreous opacification and induce questions of diabetic retinopathy. Plan posterior vitrectomy with him allays for correlation left eye under local retrobulbar management control left eye.  Patient is breathing without effort today patient has underwent another possibility  findings on clinical examination.  Plan is to take  the patient to the operating room today as an outpatient

## 2014-12-24 NOTE — Anesthesia Preprocedure Evaluation (Signed)
Anesthesia Evaluation  Patient identified by MRN, date of birth, ID band Patient awake    Reviewed: Allergy & Precautions, NPO status , Patient's Chart, lab work & pertinent test results, reviewed documented beta blocker date and time   History of Anesthesia Complications (+) history of anesthetic complications  Airway Mallampati: II  TM Distance: >3 FB Neck ROM: Full    Dental   Pulmonary asthma , pneumonia, Current Smoker,    breath sounds clear to auscultation       Cardiovascular hypertension, Pt. on medications and Pt. on home beta blockers  Rhythm:Regular Rate:Normal     Neuro/Psych negative neurological ROS     GI/Hepatic Neg liver ROS, GERD  ,  Endo/Other  diabetes, Insulin Dependent  Renal/GU      Musculoskeletal   Abdominal   Peds  Hematology negative hematology ROS (+)   Anesthesia Other Findings   Reproductive/Obstetrics                             Lab Results  Component Value Date   CREATININE 0.75 12/24/2014   BUN 14 12/24/2014   NA 138 12/24/2014   K 3.8 12/24/2014   CL 104 12/24/2014   CO2 25 12/24/2014   Lab Results  Component Value Date   WBC 12.2* 12/24/2014   HGB 15.4* 12/24/2014   HCT 44.0 12/24/2014   MCV 88.5 12/24/2014   PLT 205 12/24/2014    Anesthesia Physical  Anesthesia Plan  ASA: III  Anesthesia Plan: MAC   Post-op Pain Management:    Induction: Intravenous  Airway Management Planned:   Additional Equipment:   Intra-op Plan:   Post-operative Plan:   Informed Consent: I have reviewed the patients History and Physical, chart, labs and discussed the procedure including the risks, benefits and alternatives for the proposed anesthesia with the patient or authorized representative who has indicated his/her understanding and acceptance.   Dental advisory given  Plan Discussed with: CRNA  Anesthesia Plan Comments:          Anesthesia Quick Evaluation

## 2014-12-25 ENCOUNTER — Encounter (HOSPITAL_COMMUNITY): Payer: Self-pay | Admitting: Ophthalmology

## 2014-12-25 NOTE — Anesthesia Postprocedure Evaluation (Signed)
Anesthesia Post Note  Patient: Veterinary surgeon  Procedure(s) Performed: Procedure(s) (LRB): LEFT PARS PLANA VITRECTOMY WITH 25 GAUGE WITH ENDOLASER  (Left)  Anesthesia type: MAC  Patient location: PACU  Post pain: Pain level controlled  Post assessment: Post-op Vital signs reviewed  Last Vitals: BP 135/82 mmHg  Pulse 83  Temp(Src) 36.4 C (Oral)  Resp 18  Ht 5\' 3"  (1.6 m)  Wt 180 lb (81.647 kg)  BMI 31.89 kg/m2  SpO2 97%  LMP 04/30/2014 (Approximate)  Post vital signs: Reviewed  Level of consciousness: awake  Complications: No apparent anesthesia complications

## 2014-12-31 ENCOUNTER — Telehealth: Payer: Self-pay | Admitting: Family Medicine

## 2014-12-31 DIAGNOSIS — G5602 Carpal tunnel syndrome, left upper limb: Secondary | ICD-10-CM

## 2014-12-31 NOTE — Telephone Encounter (Signed)
Patient called in to let us know that her shoulder is better than what it was but still not 100% after shot last week.

## 2014-12-31 NOTE — Telephone Encounter (Signed)
If no better in 1 week, proceed with mri of shoulder.

## 2015-01-02 NOTE — Telephone Encounter (Signed)
Spoke to pt and informed if no better to call back and we would schedule MRI - she agreed. She then stated that she would like to go ahead with the referral to have her carpal tunnel surgery done. Per LOV note referral placed.

## 2015-01-22 ENCOUNTER — Ambulatory Visit (INDEPENDENT_AMBULATORY_CARE_PROVIDER_SITE_OTHER): Payer: Commercial Managed Care - HMO | Admitting: Family Medicine

## 2015-01-22 ENCOUNTER — Encounter: Payer: Self-pay | Admitting: Family Medicine

## 2015-01-22 VITALS — BP 128/74 | HR 98 | Temp 99.0°F | Resp 16 | Ht 63.0 in | Wt 181.0 lb

## 2015-01-22 DIAGNOSIS — Z23 Encounter for immunization: Secondary | ICD-10-CM | POA: Diagnosis not present

## 2015-01-22 DIAGNOSIS — M25512 Pain in left shoulder: Secondary | ICD-10-CM | POA: Diagnosis not present

## 2015-01-22 NOTE — Addendum Note (Signed)
Addended by: Shary Decamp B on: 01/22/2015 02:56 PM   Modules accepted: Orders

## 2015-01-22 NOTE — Progress Notes (Signed)
Subjective:    Patient ID: [name], female    DOB: [birth year], 39 y.o.   MRN: [id number]  HPI 12/21/14 Patient presents with 2 weeks of pain in her left arm from her left shoulder all the way down her arm to her left wrist. History is complicated by the fact that she has chronic carpal tunnel syndrome in the left wrist which causes pain in the left hand left wrist and left forearm. Some of this pain may be chronic. The majority of her pain is centered in her left shoulder. She is unable to abduct arm greater than 80. She has significant pain with passive abduction greater than 90. She has a positive empty can sign. She has a positive Hawkins sign. She has a negative Spurling sign. She has normal reflexes. She does have decreased grip strength in her hand but this appears to be chronic from her carpal tunnel.  At that time, my plan was: Based on her history and her physical exam I do not believe that this is a cervical radiculopathy. Instead I believe she has impingement syndrome in her left shoulder. I gave the patient option of an MRI of the left shoulder versus nerve conduction studies to rule out cervical radiculopathy. Patient would like to try a cortisone injection in the left shoulder first. Using sterile technique, I injected the left shoulder with a mixture of 2 mL of lidocaine, 2 mL of Marcaine, 2 mL of 40 mg per mL Kenalog. The patient tolerated the procedure well without complication. Follow-up next week if no better or sooner if worse.  On examination today, the patient has noticeably diminished grip strength in her left hand where she is been dealing with chronic carpal tunnel. I explained to the patient that she is developing weakness in that hand she can be doing permanent damage to the median nerve by waiting. I believe she will benefit from seeing a hand specialist for her carpal tunnel. I have asked her to call me back next week. If her shoulder pain has improved dramatically, I  would like to schedule the patient to see an orthopedist to discuss definitive treatment for carpal tunnel syndrome.  01/22/15 We placed a referral to orthopedics given her persistent symptoms of carpal tunnel.  Patient's pain improved a proximally 70% after the cortisone injection. However her range of motion is still compromised. She is only able to abduct her arm to 100 before she feels severe pain. She also is very weak in her left arm. She is unable to lift arm overhead. Shoulder abduction is 3 over 5 in strength against resistance. She also has pain with internal and external rotation Past Medical History  Diagnosis Date  . Diabetes mellitus     19 years ago  . Muscle spasms of lower extremity     legs and weakness  . Hypercholesteremia   . Asthma   . Type 1 diabetes mellitus   . Hypertension   . GERD (gastroesophageal reflux disease)   . Diabetic retinopathy   . Complication of anesthesia 07/2014    Mouth was swollen on inside, Front tooth was chipped  . Headache     Migraine- a long time ago  . Restless leg   . Vitreous hemorrhage, left eye   . Pneumonia   . Carpal tunnel syndrome on left   . Diabetic neuropathy    Past Surgical History  Procedure Laterality Date  . Tubal ligation  2002  . Dilation and curettage of uterus  89  . Wisdom tooth extraction      age 43  . Cesarean section  1993, 1998, 2002  . Laser photo ablation      left x 2, right x 1  . Pars plana vitrectomy Right 08/28/2014    Procedure: PARS PLANA VITRECTOMY WITH 25 GAUGE;  Surgeon: Hayden Pedro, MD;  Location: [place];  Service: Ophthalmology;  Laterality: Right;  . Membrane peel Right 08/28/2014    Procedure: MEMBRANE PEEL;  Surgeon: Hayden Pedro, MD;  Location: [place];  Service: Ophthalmology;  Laterality: Right;  . Laser photo ablation Right 08/28/2014    Procedure: LASER PHOTO ABLATION;  Surgeon: Hayden Pedro, MD;  Location: [place];  Service: Ophthalmology;  Laterality: Right;  . Gas/fluid  exchange Right 08/28/2014    Procedure: GAS/FLUID EXCHANGE;  Surgeon: Hayden Pedro, MD;  Location: [place];  Service: Ophthalmology;  Laterality: Right;  . Pars plana vitrectomy Left 12/24/2014    Procedure: LEFT PARS PLANA VITRECTOMY WITH 25 GAUGE WITH ENDOLASER ;  Surgeon: Hurman Horn, MD;  Location: [place];  Service: Ophthalmology;  Laterality: Left;   Current Outpatient Prescriptions on File Prior to Visit  Medication Sig Dispense Refill  . amLODipine (NORVASC) 10 MG tablet Take 1 tablet (10 mg total) by mouth daily. 90 tablet 3  . BD PEN NEEDLE NANO U/F 32G X 4 MM MISC USE AS DIRECTED 3 TIMES A DAY 100 each 9  . budesonide-formoterol (SYMBICORT) 160-4.5 MCG/ACT inhaler Inhale 2 puffs into the lungs 2 (two) times daily. 1 Inhaler 3  . carvedilol (COREG) 12.5 MG tablet TAKE 1 TABLET BY MOUTH 2  TIMES DAILY WITH A MEAL. 180 tablet 0  . gatifloxacin (ZYMAXID) 0.5 % SOLN Place 1 drop into the left eye 4 (four) times daily.    Marland Kitchen glucose blood (ONETOUCH VERIO) test strip Check BS tid - qid 100 each 11  . insulin aspart (NOVOLOG FLEXPEN) 100 UNIT/ML FlexPen INJECT 3-8 UNITS INTO THE SKIN 3 (THREE) TIMES DAILY WITH MEALS. (Sliding Scale) (Patient taking differently: Inject 3-8 Units into the skin 3 (three) times daily with meals. If blood sugar is under 100 before a full meal take 3 units, if 140 or higher before a full meal take 8 units) 45 pen 3  . Insulin Glargine (LANTUS SOLOSTAR) 100 UNIT/ML Solostar Pen Inject 70 Units into the skin daily. Give 90 day supply (Patient taking differently: Inject 70 Units into the skin daily. Give 90 day supply) 45 pen 3  . Lancets (ONETOUCH ULTRASOFT) lancets 1 each by Other route as needed for other. Use as instructed 100 each 11  . losartan (COZAAR) 100 MG tablet Take 1 tablet (100 mg total) by mouth daily. 90 tablet 3  . OVER THE COUNTER MEDICATION Take 2 tablets by mouth at bedtime. Restless leg relief    . pantoprazole (PROTONIX) 40 MG tablet Take 1 tablet (40  mg total) by mouth daily. 90 tablet 3  . pravastatin (PRAVACHOL) 40 MG tablet Take 1 tablet (40 mg total) by mouth daily. 90 tablet 3  . PROAIR HFA 108 (90 BASE) MCG/ACT inhaler 2 PUFFS EVERY 6 HOURS AS NEEDED (Patient taking differently: 2 PUFFS EVERY 6 HOURS AS NEEDED for shortness of breath) 8.5 each 3   No current facility-administered medications on file prior to visit.   Allergies  Allergen Reactions  . Fish-Derived Products Hives and Shortness Of Breath  . Demerol Itching and Rash  . Morphine And Related Itching and  Rash       . Zocor [Simvastatin] Itching and Other (See Comments)    Leg pain   Social History   Social History  . Marital Status: Married    Spouse Name: N/A  . Number of Children: N/A  . Years of Education: N/A   Occupational History  . Not on file.   Social History Main Topics  . Smoking status: Current Every Day Smoker -- 1.00 packs/day for 6 years    Types: Cigarettes  . Smokeless tobacco: Never Used  . Alcohol Use: No  . Drug Use: No  . Sexual Activity:    Partners: Male    Birth Control/ Protection: None, Surgical     Comment: tubal    Other Topics Concern  . Not on file   Social History Narrative      Review of Systems  All other systems reviewed and are negative.      Objective:   Physical Exam  Constitutional: She appears well-developed and well-nourished.  HENT:  Right Ear: External ear normal.  Left Ear: External ear normal.  Cardiovascular: Normal rate, regular rhythm and normal heart sounds.   Pulmonary/Chest: Effort normal. No respiratory distress. She has no wheezes. She has no rales. She exhibits no tenderness.  Vitals reviewed.    See history of present illness for a more thorough exam description     Assessment & Plan:  Shoulder pain, left - Plan: MR Shoulder Left Wo Contrast  I suspect that the patient has chronic tendinosis in her supraspinatus although I cannot exclude a partial tear. I gave the patient  options of physical therapy versus proceeding with an MRI. She would like to proceed with an MRI. If there is no significant tear, I would recommend physical therapy

## 2015-01-25 ENCOUNTER — Other Ambulatory Visit: Payer: Self-pay | Admitting: Family Medicine

## 2015-01-25 DIAGNOSIS — M25512 Pain in left shoulder: Secondary | ICD-10-CM

## 2015-02-07 ENCOUNTER — Other Ambulatory Visit: Payer: Self-pay | Admitting: *Deleted

## 2015-02-07 ENCOUNTER — Encounter: Payer: Self-pay | Admitting: Family Medicine

## 2015-02-07 ENCOUNTER — Ambulatory Visit (INDEPENDENT_AMBULATORY_CARE_PROVIDER_SITE_OTHER): Payer: Commercial Managed Care - HMO | Admitting: Family Medicine

## 2015-02-07 VITALS — BP 126/80 | HR 108 | Temp 98.1°F | Resp 16 | Ht 63.0 in | Wt 182.0 lb

## 2015-02-07 DIAGNOSIS — Z794 Long term (current) use of insulin: Secondary | ICD-10-CM | POA: Diagnosis not present

## 2015-02-07 DIAGNOSIS — E785 Hyperlipidemia, unspecified: Secondary | ICD-10-CM

## 2015-02-07 DIAGNOSIS — I10 Essential (primary) hypertension: Secondary | ICD-10-CM | POA: Diagnosis not present

## 2015-02-07 DIAGNOSIS — E119 Type 2 diabetes mellitus without complications: Secondary | ICD-10-CM

## 2015-02-07 DIAGNOSIS — Z716 Tobacco abuse counseling: Secondary | ICD-10-CM | POA: Diagnosis not present

## 2015-02-07 DIAGNOSIS — Z720 Tobacco use: Secondary | ICD-10-CM

## 2015-02-07 DIAGNOSIS — IMO0001 Reserved for inherently not codable concepts without codable children: Secondary | ICD-10-CM

## 2015-02-07 LAB — COMPREHENSIVE METABOLIC PANEL
ALBUMIN: 4.8 g/dL (ref 3.6–5.1)
ALK PHOS: 84 U/L (ref 33–115)
ALT: 20 U/L (ref 6–29)
AST: 15 U/L (ref 10–30)
BILIRUBIN TOTAL: 0.4 mg/dL (ref 0.2–1.2)
BUN: 13 mg/dL (ref 7–25)
CALCIUM: 10.4 mg/dL — AB (ref 8.6–10.2)
CO2: 29 mmol/L (ref 20–31)
CREATININE: 0.66 mg/dL (ref 0.50–1.10)
Chloride: 102 mmol/L (ref 98–110)
GLUCOSE: 29 mg/dL — AB (ref 70–99)
Potassium: 3.9 mmol/L (ref 3.5–5.3)
SODIUM: 140 mmol/L (ref 135–146)
Total Protein: 7.9 g/dL (ref 6.1–8.1)

## 2015-02-07 LAB — LIPID PANEL
Cholesterol: 188 mg/dL (ref 125–200)
HDL: 37 mg/dL — AB (ref >=46)
LDL CALC: 133 mg/dL — AB (ref <130)
Total CHOL/HDL Ratio: 5.1 Ratio — ABNORMAL HIGH (ref <=5.0)
Triglycerides: 90 mg/dL (ref <150)
VLDL: 18 mg/dL (ref <30)

## 2015-02-07 LAB — CBC WITH DIFFERENTIAL/PLATELET
BASOS ABS: 0 10*3/uL (ref 0.0–0.1)
BASOS PCT: 0 % (ref 0–1)
Eosinophils Absolute: 0 10*3/uL (ref 0.0–0.7)
Eosinophils Relative: 0 % (ref 0–5)
HEMATOCRIT: 45.6 % (ref 36.0–46.0)
HEMOGLOBIN: 16 g/dL — AB (ref 12.0–15.0)
LYMPHS PCT: 46 % (ref 12–46)
Lymphs Abs: 7 10*3/uL — ABNORMAL HIGH (ref 0.7–4.0)
MCH: 31.4 pg (ref 26.0–34.0)
MCHC: 35.1 g/dL (ref 30.0–36.0)
MCV: 89.6 fL (ref 78.0–100.0)
MPV: 10.1 fL (ref 8.6–12.4)
Monocytes Absolute: 1.1 10*3/uL — ABNORMAL HIGH (ref 0.1–1.0)
Monocytes Relative: 7 % (ref 3–12)
NEUTROS ABS: 7.2 10*3/uL (ref 1.7–7.7)
Neutrophils Relative %: 47 % (ref 43–77)
Platelets: 314 10*3/uL (ref 150–400)
RBC: 5.09 MIL/uL (ref 3.87–5.11)
RDW: 14.1 % (ref 11.5–15.5)
WBC: 15.3 10*3/uL — AB (ref 4.0–10.5)

## 2015-02-07 LAB — HEMOGLOBIN A1C
HEMOGLOBIN A1C: 6.9 % — AB (ref <5.7)
Mean Plasma Glucose: 151 mg/dL — ABNORMAL HIGH (ref <117)

## 2015-02-07 MED ORDER — VARENICLINE TARTRATE 0.5 MG X 11 & 1 MG X 42 PO MISC: ORAL | Status: DC

## 2015-02-07 MED ORDER — BUDESONIDE-FORMOTEROL FUMARATE 160-4.5 MCG/ACT IN AERO: 2.0000 | INHALATION_SPRAY | Freq: Two times a day (BID) | RESPIRATORY_TRACT | Status: DC

## 2015-02-07 MED ORDER — GLUCOSE BLOOD VI STRP: ORAL_STRIP | Status: DC

## 2015-02-07 MED ORDER — CARVEDILOL 12.5 MG PO TABS: ORAL_TABLET | ORAL | Status: DC

## 2015-02-07 MED ORDER — ALBUTEROL SULFATE HFA 108 (90 BASE) MCG/ACT IN AERS: INHALATION_SPRAY | RESPIRATORY_TRACT | Status: DC

## 2015-02-07 MED ORDER — ONETOUCH ULTRASOFT LANCETS MISC: 1.0000 | Status: DC | PRN

## 2015-02-07 MED ORDER — INSULIN PEN NEEDLE 32G X 4 MM MISC: Status: DC

## 2015-02-07 MED ORDER — ONETOUCH ULTRASOFT LANCETS MISC: Status: DC

## 2015-02-07 NOTE — Progress Notes (Signed)
Subjective:    Patient ID: [name], female    DOB: 1975/08/22, 39 y.o.   MRN: [id number]  HPI Patient is here today for follow-up of her diabetes. She states that her blood sugars are typically ranging between 70 and 150. She denies any hypoglycemia. She denies any polyuria, polydipsia. She is working very hard to control her sugars. Her blood pressures well controlled today at 126/80. However her sugar this morning is low at 47. This is because she is fasting to obtain her lab work today. This is causing her heart rate to be elevated and making her feel nauseated and tremulous. Therefore I gave the patient some candy while she was sitting in the office. Past Medical History  Diagnosis Date  . Diabetes mellitus     19 years ago  . Muscle spasms of lower extremity     legs and weakness  . Hypercholesteremia   . Asthma   . Type 1 diabetes mellitus ([place])   . Hypertension   . GERD (gastroesophageal reflux disease)   . Diabetic retinopathy ([place])   . Complication of anesthesia 07/2014    Mouth was swollen on inside, Front tooth was chipped  . Headache     Migraine- a long time ago  . Restless leg   . Vitreous hemorrhage, left eye ([place])   . Pneumonia   . Carpal tunnel syndrome on left   . Diabetic neuropathy [facility])    Past Surgical History  Procedure Laterality Date  . Tubal ligation  2002  . Dilation and curettage of uterus  1997  . Wisdom tooth extraction      age 76  . Cesarean section  1993, 1998, 2002  . Laser photo ablation      left x 2, right x 1  . Pars plana vitrectomy Right 08/28/2014    Procedure: PARS PLANA VITRECTOMY WITH 25 GAUGE;  Surgeon: Hayden Pedro, MD;  Location: [place];  Service: Ophthalmology;  Laterality: Right;  . Membrane peel Right 08/28/2014    Procedure: MEMBRANE PEEL;  Surgeon: Hayden Pedro, MD;  Location: [place];  Service: Ophthalmology;  Laterality: Right;  . Laser photo ablation Right 08/28/2014    Procedure: LASER PHOTO ABLATION;  Surgeon:  Hayden Pedro, MD;  Location: [place];  Service: Ophthalmology;  Laterality: Right;  . Gas/fluid exchange Right 08/28/2014    Procedure: GAS/FLUID EXCHANGE;  Surgeon: Hayden Pedro, MD;  Location: [place];  Service: Ophthalmology;  Laterality: Right;  . Pars plana vitrectomy Left 12/24/2014    Procedure: LEFT PARS PLANA VITRECTOMY WITH 25 GAUGE WITH ENDOLASER ;  Surgeon: Hurman Horn, MD;  Location: [place];  Service: Ophthalmology;  Laterality: Left;   Current Outpatient Prescriptions on File Prior to Visit  Medication Sig Dispense Refill  . amLODipine (NORVASC) 10 MG tablet Take 1 tablet (10 mg total) by mouth daily. 90 tablet 3  . BD PEN NEEDLE NANO U/F 32G X 4 MM MISC USE AS DIRECTED 3 TIMES A DAY 100 each 9  . budesonide-formoterol (SYMBICORT) 160-4.5 MCG/ACT inhaler Inhale 2 puffs into the lungs 2 (two) times daily. 1 Inhaler 3  . carvedilol (COREG) 12.5 MG tablet TAKE 1 TABLET BY MOUTH 2  TIMES DAILY WITH A MEAL. 180 tablet 0  . gatifloxacin (ZYMAXID) 0.5 % SOLN Place 1 drop into the left eye 4 (four) times daily.    Marland Kitchen glucose blood (ONETOUCH VERIO) test strip Check BS tid - qid 100 each 11  .  insulin aspart (NOVOLOG FLEXPEN) 100 UNIT/ML FlexPen INJECT 3-8 UNITS INTO THE SKIN 3 (THREE) TIMES DAILY WITH MEALS. (Sliding Scale) (Patient taking differently: Inject 3-8 Units into the skin 3 (three) times daily with meals. If blood sugar is under 100 before a full meal take 3 units, if 140 or higher before a full meal take 8 units) 45 pen 3  . Insulin Glargine (LANTUS SOLOSTAR) 100 UNIT/ML Solostar Pen Inject 70 Units into the skin daily. Give 90 day supply (Patient taking differently: Inject 70 Units into the skin daily. Give 90 day supply) 45 pen 3  . Lancets (ONETOUCH ULTRASOFT) lancets 1 each by Other route as needed for other. Use as instructed 100 each 11  . losartan (COZAAR) 100 MG tablet Take 1 tablet (100 mg total) by mouth daily. 90 tablet 3  . OVER THE COUNTER MEDICATION Take 2 tablets by  mouth at bedtime. Restless leg relief    . pantoprazole (PROTONIX) 40 MG tablet Take 1 tablet (40 mg total) by mouth daily. 90 tablet 3  . pravastatin (PRAVACHOL) 40 MG tablet Take 1 tablet (40 mg total) by mouth daily. 90 tablet 3  . PROAIR HFA 108 (90 BASE) MCG/ACT inhaler 2 PUFFS EVERY 6 HOURS AS NEEDED (Patient taking differently: 2 PUFFS EVERY 6 HOURS AS NEEDED for shortness of breath) 8.5 each 3   No current facility-administered medications on file prior to visit.   Allergies  Allergen Reactions  . Fish-Derived Products Hives and Shortness Of Breath  . Demerol Itching and Rash  . Morphine And Related Itching and Rash       . Zocor [Simvastatin] Itching and Other (See Comments)    Leg pain   Social History   Social History  . Marital Status: Married    Spouse Name: N/A  . Number of Children: N/A  . Years of Education: N/A   Occupational History  . Not on file.   Social History Main Topics  . Smoking status: Current Every Day Smoker -- 1.00 packs/day for 6 years    Types: Cigarettes  . Smokeless tobacco: Never Used  . Alcohol Use: No  . Drug Use: No  . Sexual Activity:    Partners: Male    Birth Control/ Protection: None, Surgical     Comment: tubal    Other Topics Concern  . Not on file   Social History Narrative     Review of Systems  All other systems reviewed and are negative.      Objective:   Physical Exam  Constitutional: She appears well-developed and well-nourished.  Neck: Neck supple. No thyromegaly present.  Cardiovascular: Normal rate, regular rhythm, normal heart sounds and intact distal pulses.   No murmur heard. Pulmonary/Chest: Effort normal and breath sounds normal. No respiratory distress. She has no wheezes. She has no rales.  Abdominal: Soft. Bowel sounds are normal. She exhibits no distension. There is no tenderness. There is no rebound and no guarding.  Musculoskeletal: She exhibits no edema.  Lymphadenopathy:    She has no  cervical adenopathy.  Vitals reviewed.         Assessment & Plan:  Diabetes mellitus, insulin dependent (IDDM), controlled (HCC)  Essential hypertension  HLD (hyperlipidemia)  Blood sugars sound well controlled. I will make no changes in her insulin dose at the present time. I will check a hemoglobin A1c. Goal hemoglobin A1c is less than 7.0 She is also due to check a fasting lipid panel. Goal LDL cholesterol is less than  100.  Her blood pressure today is well controlled. She does want help with smoking cessation. I will give the patient a Chantix starter pack. We discussed the increased risk of nausea abnormal dreams and depression on this medication and she is willing to accept the risk. If she needs to continue the medication next month I will prescribe a continuing Dosepak. I congratulated the patient on trying to quit smoking

## 2015-02-07 NOTE — Telephone Encounter (Signed)
Received fax requesting refill on lancets.   Refill appropriate and filled per protocol. 

## 2015-02-07 NOTE — Addendum Note (Signed)
Addended by: Shary Decamp B on: 02/07/2015 02:17 PM   Modules accepted: Orders

## 2015-02-07 NOTE — Telephone Encounter (Signed)
Received fax requesting refill on Coreg.   Refill appropriate and filled per protocol.

## 2015-02-08 LAB — PATHOLOGIST SMEAR REVIEW

## 2015-02-15 ENCOUNTER — Ambulatory Visit: Payer: Commercial Managed Care - HMO | Attending: Family Medicine | Admitting: Physical Therapy

## 2015-02-15 DIAGNOSIS — M25612 Stiffness of left shoulder, not elsewhere classified: Secondary | ICD-10-CM

## 2015-02-15 DIAGNOSIS — R29898 Other symptoms and signs involving the musculoskeletal system: Secondary | ICD-10-CM | POA: Insufficient documentation

## 2015-02-15 DIAGNOSIS — R293 Abnormal posture: Secondary | ICD-10-CM

## 2015-02-15 DIAGNOSIS — M25512 Pain in left shoulder: Secondary | ICD-10-CM | POA: Diagnosis present

## 2015-02-15 DIAGNOSIS — M7582 Other shoulder lesions, left shoulder: Secondary | ICD-10-CM | POA: Insufficient documentation

## 2015-02-15 NOTE — Therapy (Addendum)
[place], [place], [address] Phone: [phone]   Fax:  [phone]  Physical Therapy Evaluation  Patient Details  Name: [name] MRN: [id number] Date of Birth: 23-Aug-1975 Referring Provider: Susy Frizzle  Encounter Date: 02/15/2015      PT End of Session - 02/15/15 1153    Visit Number 1   Number of Visits 4   Date for PT Re-Evaluation 03/15/15   PT Start Time 2947   PT Stop Time 1230   PT Time Calculation (min) 45 min   Activity Tolerance Patient tolerated treatment well;Patient limited by pain   Behavior During Therapy [facility] for tasks assessed/performed      Past Medical History  Diagnosis Date  . Diabetes mellitus     19 years ago  . Muscle spasms of lower extremity     legs and weakness  . Hypercholesteremia   . Asthma   . Type 1 diabetes mellitus ([place])   . Hypertension   . GERD (gastroesophageal reflux disease)   . Diabetic retinopathy ([place])   . Complication of anesthesia 07/2014    Mouth was swollen on inside, Front tooth was chipped  . Headache     Migraine- a long time ago  . Restless leg   . Vitreous hemorrhage, left eye ([employer])   . Pneumonia   . Carpal tunnel syndrome on left   . Diabetic neuropathy [facility])     Past Surgical History  Procedure Laterality Date  . Tubal ligation  2002  . Dilation and curettage of uterus  1997  . Wisdom tooth extraction      age 37  . Cesarean section  1993, 1998, 2002  . Laser photo ablation      left x 2, right x 1  . Pars plana vitrectomy Right 08/28/2014    Procedure: PARS PLANA VITRECTOMY WITH 25 GAUGE;  Surgeon: Hayden Pedro, MD;  Location: [place];  Service: Ophthalmology;  Laterality: Right;  . Membrane peel Right 08/28/2014    Procedure: MEMBRANE PEEL;  Surgeon: Hayden Pedro, MD;  Location: [place];  Service: Ophthalmology;  Laterality: Right;  . Laser photo ablation Right 08/28/2014    Procedure: LASER PHOTO ABLATION;   Surgeon: Hayden Pedro, MD;  Location: [place];  Service: Ophthalmology;  Laterality: Right;  . Gas/fluid exchange Right 08/28/2014    Procedure: GAS/FLUID EXCHANGE;  Surgeon: Hayden Pedro, MD;  Location: [place];  Service: Ophthalmology;  Laterality: Right;  . Pars plana vitrectomy Left 12/24/2014    Procedure: LEFT PARS PLANA VITRECTOMY WITH 25 GAUGE WITH ENDOLASER ;  Surgeon: Hurman Horn, MD;  Location: [place];  Service: Ophthalmology;  Laterality: Left;    There were no vitals filed for this visit.  Visit Diagnosis:  Left shoulder pain - Plan: PT plan of care cert/re-cert  Left arm weakness - Plan: PT plan of care cert/re-cert  Decreased ROM of left shoulder - Plan: PT plan of care cert/re-cert  Abnormal posture - Plan: PT plan of care cert/re-cert      Subjective Assessment - 02/15/15 1106    Subjective pt is a 39 y.o F with L shoulder pain that started about 5-6 months that started insidously. She reported difficulty lifting arm up and that the pain stays in the shoulder. since onset she reports it was worsening, she got a coritizone shot for her carpal tunnel which helped the shoulder but now is getting worse again.    Limitations  Lifting;House hold activities   How long can you sit comfortably? unlimited   How long can you stand comfortably? unlimited   How long can you walk comfortably? unlimited   Diagnostic tests no imaging peformed on the shoulder   Patient Stated Goals to the arm to stop   Currently in Pain? Yes   Pain Score 6    Pain Location Shoulder   Pain Orientation Left   Pain Descriptors / Indicators Sharp;Aching;Sore   Pain Type Chronic pain   Pain Onset More than a month ago   Pain Frequency Constant   Aggravating Factors  moving, lifting, carrying, sleeping on it   Pain Relieving Factors tylenol            [facility] PT Assessment - 02/15/15 1110    Assessment   Medical Diagnosis L shoulder pain   Referring Provider Cletus Gash T Pickard   Onset  Date/Surgical Date --  5-6 months ago   Hand Dominance Right   Next MD Visit make one as needed   Prior Therapy no   Precautions   Precautions None   Restrictions   Weight Bearing Restrictions No   Balance Screen   Has the patient fallen in the past 6 months No   Has the patient had a decrease in activity level because of a fear of falling?  No   Is the patient reluctant to leave their home because of a fear of falling?  No   Home Environment   Living Environment Private residence   Living Arrangements Children;Spouse/significant other   Available Help at Discharge Available PRN/intermittently;Available 24 hours/day   Type of Home House   Home Access Stairs to enter   Entrance Stairs-Number of Steps 3   Entrance Stairs-Rails None   Home Layout One level   Prior Function   Level of Independence Independent;Independent with basic ADLs   Vocation Part time employment  Day[employer]   Vocation Requirements lifting, carrying, moving around, crawling   Leisure hangout with family   Cognition   Overall Cognitive Status Within Functional Limits for tasks assessed   Observation/Other Assessments   Focus on Therapeutic Outcomes (FOTO)  78% limited  41% limited   Posture/Postural Control   Posture/Postural Control Postural limitations   Postural Limitations Rounded Shoulders;Forward head   ROM / Strength   AROM / PROM / Strength AROM;PROM;Strength   AROM   AROM Assessment Site Shoulder   Right/Left Shoulder Right;Left   Right Shoulder Extension 60 Degrees   Right Shoulder Flexion 165 Degrees   Right Shoulder ABduction 130 Degrees   Right Shoulder Internal Rotation 90 Degrees   Right Shoulder External Rotation 86 Degrees   Left Shoulder Extension 50 Degrees   Left Shoulder Flexion 76 Degrees  pain during and at endrange   Left Shoulder ABduction 70 Degrees  pain during and at endrange   Left Shoulder Internal Rotation 50 Degrees   Left Shoulder External Rotation 45 Degrees   PROM    Overall PROM  Within functional limits for tasks performed   PROM Assessment Site Shoulder   Right/Left Shoulder Right;Left   Strength   Strength Assessment Site Shoulder;Hand   Right/Left Shoulder Right;Left   Right Shoulder Flexion 4+/5   Right Shoulder Extension 5/5   Right Shoulder ABduction 5/5   Right Shoulder Internal Rotation 4+/5   Right Shoulder External Rotation 4+/5   Left Shoulder Flexion 3+/5   Left Shoulder Extension 4-/5   Left Shoulder ABduction 4-/5   Left Shoulder Internal Rotation  4-/5   Left Shoulder External Rotation 3+/5   Right Hand Grip (lbs) 69  65,71,71   Left Hand Grip (lbs) 36  39,35,34   Special Tests    Special Tests Rotator Cuff Impingement;Biceps/Labral Tests   Rotator Cuff Impingment tests Neer impingement test;Hawkins- Kennedy test;Painful Arc of Motion;other   Biceps/Labral tests Crank Test   Neer Impingement test    Findings Negative   Hawkins-Kennedy test   Findings Negative   Painful Arc of Motion   Findings Unable to test   Comments cant get full AROM    other   Findings Negative   Comments scapular assist   Clunk Test   Findings Negative   Side Left   O'Brien's Test   Findings Positive   Side Left   Crank Test   Findings Negative   Side Left                           PT Education - 02/15/15 1153    Education provided Yes   Education Details evaluation findings, POC, goals, HEP   Person(s) Educated Patient   Methods Explanation   Comprehension Verbalized understanding          PT Short Term Goals - 02/15/15 1201    PT SHORT TERM GOAL #1   Title STG=LTG           PT Long Term Goals - 02/15/15 1202    PT LONG TERM GOAL #1   Title pt will be I with all HEP given    Time 4   Period Weeks   Status New   PT LONG TERM GOAL #2   Title pt will demonstrate decreaesed pain in the L shoulder to </= 4/10 pain to assist with ADLS    Time 4   Period Weeks   Status New   PT LONG TERM GOAL #3    Title pt will increase her R shoulder flexion/ abduciotn to > /= 10 degrees to help with ADLs    Time 4   Period Weeks   Status New   PT LONG TERM GOAL #4   Title pt will increase her FOTO score by >/= 8 points to demonstrate improved function   Time 4   Period Weeks   Status New               Plan - 02/15/15 1154    Clinical Impression Statement [name] presents to OPPT with CC of L shoulder pain that has been present for 4-5 months ago. She demonstrates limited AROM in all planes limited by pain in all planes with PROM WFL. MMT revealed weakness of the L shoulder with significant weakness with resisted ER. Palpation revealed pain in the infrapsinatus and teresminor with referral to the greater tubercle and tightness in the upper trap. special testing was negative for impingement and for labral pathology. based on signs and symptoms in combination with testing indicates a high probability of a rotator cuff tear.  She would benefit from physical therapy to decrease pain by addressing the impairments listed.    Pt will benefit from skilled therapeutic intervention in order to improve on the following deficits Pain;Improper body mechanics;Postural dysfunction;Decreased strength;Hypomobility;Decreased activity tolerance;Decreased endurance;Impaired UE functional use;Decreased range of motion   Rehab Potential Fair   PT Frequency 1x / week   PT Duration 4 weeks   PT Treatment/Interventions ADLs/Self Care Home Management;Electrical Stimulation;Iontophoresis 87m/ml Dexamethasone;Moist Heat;Therapeutic exercise;Therapeutic activities;Taping;Manual techniques;Patient/family education;Ultrasound;Cryotherapy;Passive range  of motion   PT Next Visit Plan assess response to HEP, modalities PRN for pain,    PT Home Exercise Plan isometric strengthening, wand flexion/ abduction and ER.    Consulted and Agree with Plan of Care Patient         Problem List Patient Active Problem List   Diagnosis  Date Noted  . Vitreous hemorrhage of right eye ([place]) 08/28/2014  . Vitreous hemorrhage ([place]) 08/08/2014  . Proliferative diabetic retinopathy ([place]) 08/08/2014  . Asthma with bronchitis 12/26/2012  . Type II diabetes mellitus ([place]) 12/26/2012  . Abnormal HPV Pap smear, poor sample 01/29/2011   Starr Lake PT, DPT, LAT, ATC  02/15/2015  12:08 PM    [place] [facility] [address] [place], [place], [address] Phone: [phone]   Fax:  [phone]  Name: [name] MRN: [id number] Date of Birth: 1976-01-10   PHYSICAL THERAPY DISCHARGE SUMMARY  Visits from Start of Care: 1  Current functional level related to goals / functional outcomes: See goals   Remaining deficits: Unknown due to pt not returning.   Education / Equipment: HEP  Plan:                                                    Patient goals were not met. Patient is being discharged due to not returning since the last visit.  ?????       Bohden Dung PT, DPT, LAT, ATC  06/17/2015  9:25 AM

## 2015-02-15 NOTE — Patient Instructions (Signed)
   Briseidy Spark PT, DPT, LAT, ATC  [facility] Phone: [phone]     

## 2015-02-20 ENCOUNTER — Encounter: Payer: Self-pay | Admitting: Family Medicine

## 2015-02-25 ENCOUNTER — Other Ambulatory Visit: Payer: Self-pay | Admitting: Family Medicine

## 2015-02-25 ENCOUNTER — Ambulatory Visit: Payer: Commercial Managed Care - HMO

## 2015-02-25 ENCOUNTER — Encounter: Payer: Self-pay | Admitting: Family Medicine

## 2015-02-25 ENCOUNTER — Ambulatory Visit (INDEPENDENT_AMBULATORY_CARE_PROVIDER_SITE_OTHER): Payer: Commercial Managed Care - HMO | Admitting: Family Medicine

## 2015-02-25 VITALS — BP 130/78 | HR 98 | Temp 98.4°F | Resp 14 | Ht 63.0 in | Wt 183.0 lb

## 2015-02-25 DIAGNOSIS — J4541 Moderate persistent asthma with (acute) exacerbation: Secondary | ICD-10-CM | POA: Diagnosis not present

## 2015-02-25 DIAGNOSIS — F17200 Nicotine dependence, unspecified, uncomplicated: Secondary | ICD-10-CM

## 2015-02-25 DIAGNOSIS — D72820 Lymphocytosis (symptomatic): Secondary | ICD-10-CM

## 2015-02-25 MED ORDER — ALBUTEROL SULFATE (2.5 MG/3ML) 0.083% IN NEBU: 2.5000 mg | INHALATION_SOLUTION | RESPIRATORY_TRACT | Status: DC | PRN

## 2015-02-25 MED ORDER — PREDNISONE 20 MG PO TABS: 20.0000 mg | ORAL_TABLET | Freq: Every day | ORAL | Status: DC

## 2015-02-25 MED ORDER — AZITHROMYCIN 250 MG PO TABS: ORAL_TABLET | ORAL | Status: DC

## 2015-02-25 MED ORDER — GUAIFENESIN-CODEINE 100-10 MG/5ML PO SOLN: 5.0000 mL | Freq: Four times a day (QID) | ORAL | Status: DC | PRN

## 2015-02-25 NOTE — Assessment & Plan Note (Signed)
Treat with steroids, zpak, albuterol nebs. Discussed CBG may run a little higher, robitussin AC

## 2015-02-25 NOTE — Patient Instructions (Signed)
Give note for work- can return on Wed  Take antibiotics, prednisone  Use the albuterol every 4 hours  F/U as needed
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
## 2015-02-25 NOTE — Assessment & Plan Note (Signed)
counsled on cessation, plans to start Chantix this week

## 2015-02-26 LAB — IMMUNOPHENOTYPING BY FLOW CYTOMETRY: VIABILITY: 92 %

## 2015-02-26 LAB — REFLEX BLAST SCREEN

## 2015-02-28 ENCOUNTER — Other Ambulatory Visit: Payer: Self-pay | Admitting: Family Medicine

## 2015-02-28 DIAGNOSIS — D72829 Elevated white blood cell count, unspecified: Secondary | ICD-10-CM

## 2015-03-04 ENCOUNTER — Encounter: Payer: Commercial Managed Care - HMO | Admitting: Physical Therapy

## 2015-03-05 ENCOUNTER — Telehealth: Payer: Self-pay | Admitting: Family Medicine

## 2015-03-05 NOTE — Telephone Encounter (Signed)
Patient would like referral for mri if possible, please call her with details about the torn rotator cuff  [phone]

## 2015-03-07 NOTE — Telephone Encounter (Signed)
lmtrc

## 2015-03-11 ENCOUNTER — Encounter: Payer: Commercial Managed Care - HMO | Admitting: Physical Therapy

## 2015-03-11 NOTE — Telephone Encounter (Signed)
Spoke to pt and states she had PT already, explained to her she has to have at least 6 weeks of therapy before can proceed, pt wants to try to get approved again.

## 2015-03-12 ENCOUNTER — Telehealth: Payer: Self-pay | Admitting: Family Medicine

## 2015-03-12 MED ORDER — VARENICLINE TARTRATE 1 MG PO TABS: 1.0000 mg | ORAL_TABLET | Freq: Two times a day (BID) | ORAL | Status: DC

## 2015-03-12 NOTE — Telephone Encounter (Signed)
Medication called/sent to requested pharmacy  

## 2015-03-12 NOTE — Telephone Encounter (Signed)
Patient calling to say that she thinks there is a 2nd step to her chantix rx that she needs please call her at [phone] if any questions  [facility] [address]

## 2015-03-18 ENCOUNTER — Encounter: Payer: Commercial Managed Care - HMO | Admitting: Physical Therapy

## 2015-04-04 ENCOUNTER — Ambulatory Visit (INDEPENDENT_AMBULATORY_CARE_PROVIDER_SITE_OTHER): Payer: Commercial Managed Care - HMO | Admitting: Physician Assistant

## 2015-04-04 ENCOUNTER — Encounter: Payer: Self-pay | Admitting: Family Medicine

## 2015-04-04 ENCOUNTER — Encounter: Payer: Self-pay | Admitting: Physician Assistant

## 2015-04-04 VITALS — BP 134/80 | HR 100 | Temp 98.8°F | Resp 18 | Wt 180.0 lb

## 2015-04-04 DIAGNOSIS — Z794 Long term (current) use of insulin: Secondary | ICD-10-CM | POA: Diagnosis not present

## 2015-04-04 DIAGNOSIS — J4541 Moderate persistent asthma with (acute) exacerbation: Secondary | ICD-10-CM

## 2015-04-04 DIAGNOSIS — E119 Type 2 diabetes mellitus without complications: Secondary | ICD-10-CM | POA: Diagnosis not present

## 2015-04-04 DIAGNOSIS — K2970 Gastritis, unspecified, without bleeding: Secondary | ICD-10-CM

## 2015-04-04 DIAGNOSIS — B9689 Other specified bacterial agents as the cause of diseases classified elsewhere: Secondary | ICD-10-CM

## 2015-04-04 DIAGNOSIS — J988 Other specified respiratory disorders: Secondary | ICD-10-CM | POA: Diagnosis not present

## 2015-04-04 DIAGNOSIS — F17200 Nicotine dependence, unspecified, uncomplicated: Secondary | ICD-10-CM | POA: Diagnosis not present

## 2015-04-04 MED ORDER — PROMETHAZINE HCL 25 MG RE SUPP: 25.0000 mg | Freq: Four times a day (QID) | RECTAL | Status: DC | PRN

## 2015-04-04 MED ORDER — AZITHROMYCIN 250 MG PO TABS: ORAL_TABLET | ORAL | Status: DC

## 2015-04-04 MED ORDER — PREDNISONE 20 MG PO TABS: 20.0000 mg | ORAL_TABLET | Freq: Every day | ORAL | Status: DC

## 2015-04-04 NOTE — Progress Notes (Signed)
Patient ID: Iana Stobaugh MRN: [id number], DOB: 07/16/1975, 40 y.o. Date of Encounter: 04/04/2015, 1:30 PM    Chief Complaint:  Chief Complaint  Patient presents with  . sick x 2-3 days    fever, chills, aches, vomiting     HPI: 40 y.o. year old white female says that about a week ago she started to develop a head cold. Started taking Mucinex and using her inhaler. 2 or 3 days ago had to start using her nebulizer because was not getting enough relief with the inhaler. Says that she just use the nebulizer 2 or 3 hours ago and says that her breathing gets much better after using that. Says the prior to using the nebulizer she can tell that she is wheezing. Says that she has been feeling irritation in her head and blowing out thick dark mucus and coughing up thick dark phlegm. Is that she was having all of these above symptoms but then last night she also developed fever chills and vomiting. Says that she started vomiting last night. Has had no diarrhea. Even today has vomited multiple times and feels that she has vomited until now it is just dry heaves. No localized abdominal pain. Just diffuse abdominal cramping sensation. Because of her lack of intake and her continuous vomiting she has not been using any insulin and is checking her blood sugar to track/manage this.     Home Meds:   Outpatient Prescriptions Prior to Visit  Medication Sig Dispense Refill  . albuterol (PROAIR HFA) 108 (90 BASE) MCG/ACT inhaler 2 PUFFS EVERY 6 HOURS AS NEEDED for shortness of breath 3 each 3  . albuterol (PROVENTIL) (2.5 MG/3ML) 0.083% nebulizer solution Take 3 mLs (2.5 mg total) by nebulization every 4 (four) hours as needed for wheezing or shortness of breath. 150 mL 1  . amLODipine (NORVASC) 10 MG tablet Take 1 tablet (10 mg total) by mouth daily. 90 tablet 3  . budesonide-formoterol (SYMBICORT) 160-4.5 MCG/ACT inhaler Inhale 2 puffs into the lungs 2 (two) times daily. 3 Inhaler 3  . carvedilol (COREG)  12.5 MG tablet TAKE 1 TABLET BY MOUTH 2  TIMES DAILY WITH A MEAL. 180 tablet 0  . glucose blood (ONETOUCH VERIO) test strip Check BS tid - qid 300 each 3  . guaiFENesin-codeine 100-10 MG/5ML syrup Take 5 mLs by mouth every 6 (six) hours as needed. 180 mL 0  . insulin aspart (NOVOLOG FLEXPEN) 100 UNIT/ML FlexPen INJECT 3-8 UNITS INTO THE SKIN 3 (THREE) TIMES DAILY WITH MEALS. (Sliding Scale) (Patient taking differently: Inject 3-8 Units into the skin 3 (three) times daily with meals. If blood sugar is under 100 before a full meal take 3 units, if 140 or higher before a full meal take 8 units) 45 pen 3  . Insulin Glargine (LANTUS SOLOSTAR) 100 UNIT/ML Solostar Pen Inject 70 Units into the skin daily. Give 90 day supply (Patient taking differently: Inject 70 Units into the skin daily. Give 90 day supply) 45 pen 3  . Insulin Pen Needle (BD PEN NEEDLE NANO U/F) 32G X 4 MM MISC USE AS DIRECTED 3 TIMES A DAY 300 each 3  . Lancets (ONETOUCH ULTRASOFT) lancets Use as instructed to monitor FSBS 3-4x daily d/t fluctuating FSBS. Dx: E11.65. 300 each 3  . losartan (COZAAR) 100 MG tablet Take 1 tablet (100 mg total) by mouth daily. 90 tablet 3  . OVER THE COUNTER MEDICATION Take 2 tablets by mouth at bedtime. Restless leg relief    .  pantoprazole (PROTONIX) 40 MG tablet Take 1 tablet (40 mg total) by mouth daily. 90 tablet 3  . pravastatin (PRAVACHOL) 40 MG tablet Take 1 tablet (40 mg total) by mouth daily. 90 tablet 3  . varenicline (CHANTIX CONTINUING MONTH PAK) 1 MG tablet Take 1 tablet (1 mg total) by mouth 2 (two) times daily. 60 tablet 1  . azithromycin (ZITHROMAX) 250 MG tablet Take 2 tablets x 1 day, then 1 tab daily for 4 days (Patient not taking: Reported on 04/04/2015) 6 tablet 0  . predniSONE (DELTASONE) 20 MG tablet Take 1 tablet (20 mg total) by mouth daily with breakfast. 40 tablet 0   No facility-administered medications prior to visit.    Allergies:  Allergies  Allergen Reactions  .  Fish-Derived Products Hives and Shortness Of Breath  . Demerol Itching and Rash  . Morphine And Related Itching and Rash       . Zocor [Simvastatin] Itching and Other (See Comments)    Leg pain      Review of Systems: See HPI for pertinent ROS. All other ROS negative.    Physical Exam: Blood pressure 134/80, pulse 100, temperature 98.8 F (37.1 C), temperature source Oral, resp. rate 18, weight 180 lb (81.647 kg)., Body mass index is 31.89 kg/(m^2). General:  WF. Appears in no acute distress. HEENT: Normocephalic, atraumatic, eyes without discharge, sclera non-icteric, nares are without discharge. Bilateral auditory canals clear, TM's are without perforation, pearly grey and translucent with reflective cone of light bilaterally. Oral cavity moist, posterior pharynx without exudate, erythema, peritonsillar abscess.  Neck: Supple. No thyromegaly. No lymphadenopathy. Lungs:  There is slight wheeze at the very end of expiration bilaterally throughout. Otherwise clear and good breath sounds and good air movement. Heart: Regular rhythm. No murmurs, rubs, or gallops. Abdomen: Soft,  non-distended with normoactive bowel sounds. No hepatomegaly. No rebound/guarding. No obvious abdominal masses. She has mild diffuse tenderness with palpation throughout abdomen. Msk:  Strength and tone normal for age. Extremities/Skin: Warm and dry. No clubbing or cyanosis. No edema. No rashes or suspicious lesions. Neuro: Alert and oriented X 3. Moves all extremities spontaneously. Gait is normal. CNII-XII grossly in tact. Psych:  Responds to questions appropriately with a normal affect.     ASSESSMENT AND PLAN:  40 y.o. year old female with  1. Type 2 diabetes mellitus without complication, with long-term current use of insulin (HCC) She is to hold off on using her insulin and will check her blood sugar and manage accordingly. She says that she has been on insulin for a long time and knows how to adjust this  but I told her to call us if any question regarding this. She will do this until she is able to resume a normal diet. See #5 below.  2. Asthma with bronchitis, moderate persistent, with acute exacerbation - predniSONE (DELTASONE) 20 MG tablet; Take 1 tablet (20 mg total) by mouth daily with breakfast.  Dispense: 40 tablet; Refill: 0  3. Tobacco use disorder - predniSONE (DELTASONE) 20 MG tablet; Take 1 tablet (20 mg total) by mouth daily with breakfast.  Dispense: 40 tablet; Refill: 0  4. Bacterial respiratory infection - azithromycin (ZITHROMAX) 250 MG tablet; Take 2 tablets x 1 day, then 1 tab daily for 4 days  Dispense: 6 tablet; Refill: 0  5. Viral gastritis - promethazine (PHENERGAN) 25 MG suppository; Place 1 suppository (25 mg total) rectally every 6 (six) hours as needed for nausea or vomiting.  Dispense: 12 each; Refill: 0  She is to use the Phenergan to control her nausea symptoms. She is to stay with clear liquid diet then gradually go to a bland diet as tolerated. She must wait until the vomiting has stopped and she is tolerating diet prior to starting any of the other medicines used for her respiratory infection/bronchitis. Then she is to take the prednisone and the Z-Pak as directed. During this time she will also be adjusting her insulin as described under #1 above. She is to follow-up with [place] if any issues with any of these or symptoms do not resolve upon completion of above medications.   Signed, [address] [place], [place], BSFM 04/04/2015 1:30 PM

## 2015-04-26 ENCOUNTER — Other Ambulatory Visit: Payer: Commercial Managed Care - HMO

## 2015-04-26 DIAGNOSIS — D72829 Elevated white blood cell count, unspecified: Secondary | ICD-10-CM

## 2015-04-26 LAB — CBC WITH DIFFERENTIAL/PLATELET
Basophils Absolute: 0 10*3/uL (ref 0.0–0.1)
Basophils Relative: 0 % (ref 0–1)
EOS ABS: 0.1 10*3/uL (ref 0.0–0.7)
EOS PCT: 1 % (ref 0–5)
HEMATOCRIT: 42.8 % (ref 36.0–46.0)
Hemoglobin: 14.2 g/dL (ref 12.0–15.0)
LYMPHS ABS: 5 10*3/uL — AB (ref 0.7–4.0)
LYMPHS PCT: 49 % — AB (ref 12–46)
MCH: 30.7 pg (ref 26.0–34.0)
MCHC: 33.2 g/dL (ref 30.0–36.0)
MCV: 92.4 fL (ref 78.0–100.0)
MONOS PCT: 7 % (ref 3–12)
MPV: 10.8 fL (ref 8.6–12.4)
Monocytes Absolute: 0.7 10*3/uL (ref 0.1–1.0)
NEUTROS PCT: 43 % (ref 43–77)
Neutro Abs: 4.4 10*3/uL (ref 1.7–7.7)
PLATELETS: 232 10*3/uL (ref 150–400)
RBC: 4.63 MIL/uL (ref 3.87–5.11)
RDW: 13.2 % (ref 11.5–15.5)
WBC: 10.2 10*3/uL (ref 4.0–10.5)

## 2015-05-09 ENCOUNTER — Encounter: Payer: Self-pay | Admitting: Family Medicine

## 2015-05-09 NOTE — Progress Notes (Signed)
Subjective:    Patient ID: [name], female    DOB: 1975/09/06, 40 y.o.   MRN: [id number]  HPI 11/16 Patient is here today for follow-up of her diabetes. She states that her blood sugars are typically ranging between 70 and 150. She denies any hypoglycemia. She denies any polyuria, polydipsia. She is working very hard to control her sugars. Her blood pressures well controlled today at 126/80. However her sugar this morning is low at 47. This is because she is fasting to obtain her lab work today. This is causing her heart rate to be elevated and making her feel nauseated and tremulous. Therefore I gave the patient some candy while she was sitting in the office.  At that time, my plan was: Blood sugars sound well controlled. I will make no changes in her insulin dose at the present time. I will check a hemoglobin A1c. Goal hemoglobin A1c is less than 7.0 She is also due to check a fasting lipid panel. Goal LDL cholesterol is less than 100.  Her blood pressure today is well controlled. She does want help with smoking cessation. I will give the patient a Chantix starter pack. We discussed the increased risk of nausea abnormal dreams and depression on this medication and she is willing to accept the risk. If she needs to continue the medication next month I will prescribe a continuing Dosepak. I congratulated the patient on trying to quit smoking  05/08/14 Here today for follow up.   Past Medical History  Diagnosis Date  . Diabetes mellitus     19 years ago  . Muscle spasms of lower extremity     legs and weakness  . Hypercholesteremia   . Asthma   . Type 1 diabetes mellitus ([place])   . Hypertension   . GERD (gastroesophageal reflux disease)   . Diabetic retinopathy ([place])   . Complication of anesthesia 07/2014    Mouth was swollen on inside, Front tooth was chipped  . Headache     Migraine- a long time ago  . Restless leg   . Vitreous hemorrhage, left eye ([place])   . Pneumonia   . Carpal  tunnel syndrome on left   . Diabetic neuropathy [facility])    Past Surgical History  Procedure Laterality Date  . Tubal ligation  2002  . Dilation and curettage of uterus  1997  . Wisdom tooth extraction      age 28  . Cesarean section  1993, 1998, 2002  . Laser photo ablation      left x 2, right x 1  . Pars plana vitrectomy Right 08/28/2014    Procedure: PARS PLANA VITRECTOMY WITH 25 GAUGE;  Surgeon: Hayden Pedro, MD;  Location: [place];  Service: Ophthalmology;  Laterality: Right;  . Membrane peel Right 08/28/2014    Procedure: MEMBRANE PEEL;  Surgeon: Hayden Pedro, MD;  Location: [place];  Service: Ophthalmology;  Laterality: Right;  . Laser photo ablation Right 08/28/2014    Procedure: LASER PHOTO ABLATION;  Surgeon: Hayden Pedro, MD;  Location: [place];  Service: Ophthalmology;  Laterality: Right;  . Gas/fluid exchange Right 08/28/2014    Procedure: GAS/FLUID EXCHANGE;  Surgeon: Hayden Pedro, MD;  Location: [place];  Service: Ophthalmology;  Laterality: Right;  . Pars plana vitrectomy Left 12/24/2014    Procedure: LEFT PARS PLANA VITRECTOMY WITH 25 GAUGE WITH ENDOLASER ;  Surgeon: Hurman Horn, MD;  Location: [place];  Service: Ophthalmology;  Laterality: Left;  Current Outpatient Prescriptions on File Prior to Visit  Medication Sig Dispense Refill  . albuterol (PROAIR HFA) 108 (90 BASE) MCG/ACT inhaler 2 PUFFS EVERY 6 HOURS AS NEEDED for shortness of breath 3 each 3  . albuterol (PROVENTIL) (2.5 MG/3ML) 0.083% nebulizer solution Take 3 mLs (2.5 mg total) by nebulization every 4 (four) hours as needed for wheezing or shortness of breath. 150 mL 1  . amLODipine (NORVASC) 10 MG tablet Take 1 tablet (10 mg total) by mouth daily. 90 tablet 3  . azithromycin (ZITHROMAX) 250 MG tablet Take 2 tablets x 1 day, then 1 tab daily for 4 days 6 tablet 0  . budesonide-formoterol (SYMBICORT) 160-4.5 MCG/ACT inhaler Inhale 2 puffs into the lungs 2 (two) times daily. 3 Inhaler 3  . carvedilol (COREG)  12.5 MG tablet TAKE 1 TABLET BY MOUTH 2  TIMES DAILY WITH A MEAL. 180 tablet 0  . glucose blood (ONETOUCH VERIO) test strip Check BS tid - qid 300 each 3  . guaiFENesin-codeine 100-10 MG/5ML syrup Take 5 mLs by mouth every 6 (six) hours as needed. 180 mL 0  . insulin aspart (NOVOLOG FLEXPEN) 100 UNIT/ML FlexPen INJECT 3-8 UNITS INTO THE SKIN 3 (THREE) TIMES DAILY WITH MEALS. (Sliding Scale) (Patient taking differently: Inject 3-8 Units into the skin 3 (three) times daily with meals. If blood sugar is under 100 before a full meal take 3 units, if 140 or higher before a full meal take 8 units) 45 pen 3  . Insulin Glargine (LANTUS SOLOSTAR) 100 UNIT/ML Solostar Pen Inject 70 Units into the skin daily. Give 90 day supply (Patient taking differently: Inject 70 Units into the skin daily. Give 90 day supply) 45 pen 3  . Insulin Pen Needle (BD PEN NEEDLE NANO U/F) 32G X 4 MM MISC USE AS DIRECTED 3 TIMES A DAY 300 each 3  . Lancets (ONETOUCH ULTRASOFT) lancets Use as instructed to monitor FSBS 3-4x daily d/t fluctuating FSBS. Dx: E11.65. 300 each 3  . losartan (COZAAR) 100 MG tablet Take 1 tablet (100 mg total) by mouth daily. 90 tablet 3  . OVER THE COUNTER MEDICATION Take 2 tablets by mouth at bedtime. Restless leg relief    . pantoprazole (PROTONIX) 40 MG tablet Take 1 tablet (40 mg total) by mouth daily. 90 tablet 3  . pravastatin (PRAVACHOL) 40 MG tablet Take 1 tablet (40 mg total) by mouth daily. 90 tablet 3  . predniSONE (DELTASONE) 20 MG tablet Take 1 tablet (20 mg total) by mouth daily with breakfast. 40 tablet 0  . promethazine (PHENERGAN) 25 MG suppository Place 1 suppository (25 mg total) rectally every 6 (six) hours as needed for nausea or vomiting. 12 each 0  . varenicline (CHANTIX CONTINUING MONTH PAK) 1 MG tablet Take 1 tablet (1 mg total) by mouth 2 (two) times daily. 60 tablet 1   No current facility-administered medications on file prior to visit.   Allergies  Allergen Reactions  .  Fish-Derived Products Hives and Shortness Of Breath  . Demerol Itching and Rash  . Morphine And Related Itching and Rash       . Zocor [Simvastatin] Itching and Other (See Comments)    Leg pain   Social History   Social History  . Marital Status: Married    Spouse Name: N/A  . Number of Children: N/A  . Years of Education: N/A   Occupational History  . Not on file.   Social History Main Topics  . Smoking status: Current Every  Day Smoker -- 1.00 packs/day for 6 years    Types: Cigarettes  . Smokeless tobacco: Never Used  . Alcohol Use: No  . Drug Use: No  . Sexual Activity:    Partners: Male    Birth Control/ Protection: None, Surgical     Comment: tubal    Other Topics Concern  . Not on file   Social History Narrative     Review of Systems  All other systems reviewed and are negative.      Objective:   Physical Exam  Constitutional: She appears well-developed and well-nourished.  Neck: Neck supple. No thyromegaly present.  Cardiovascular: Normal rate, regular rhythm, normal heart sounds and intact distal pulses.   No murmur heard. Pulmonary/Chest: Effort normal and breath sounds normal. No respiratory distress. She has no wheezes. She has no rales.  Abdominal: Soft. Bowel sounds are normal. She exhibits no distension. There is no tenderness. There is no rebound and no guarding.  Musculoskeletal: She exhibits no edema.  Lymphadenopathy:    She has no cervical adenopathy.  Vitals reviewed.         Assessment & Plan:   This encounter was created in error - please disregard.

## 2015-05-10 ENCOUNTER — Other Ambulatory Visit: Payer: Self-pay | Admitting: Family Medicine

## 2015-05-10 NOTE — Telephone Encounter (Signed)
Refill appropriate and filled per protocol. 

## 2015-05-30 ENCOUNTER — Ambulatory Visit: Payer: Self-pay | Admitting: Family Medicine

## 2015-06-04 ENCOUNTER — Encounter: Payer: Self-pay | Admitting: Family Medicine

## 2015-06-04 ENCOUNTER — Ambulatory Visit (INDEPENDENT_AMBULATORY_CARE_PROVIDER_SITE_OTHER): Payer: Commercial Managed Care - HMO | Admitting: Family Medicine

## 2015-06-04 VITALS — BP 130/86 | HR 88 | Temp 98.3°F | Resp 18 | Ht 63.0 in | Wt 183.0 lb

## 2015-06-04 DIAGNOSIS — E785 Hyperlipidemia, unspecified: Secondary | ICD-10-CM

## 2015-06-04 DIAGNOSIS — E119 Type 2 diabetes mellitus without complications: Secondary | ICD-10-CM

## 2015-06-04 DIAGNOSIS — Z794 Long term (current) use of insulin: Secondary | ICD-10-CM | POA: Diagnosis not present

## 2015-06-04 DIAGNOSIS — I10 Essential (primary) hypertension: Secondary | ICD-10-CM

## 2015-06-04 LAB — CBC WITH DIFFERENTIAL/PLATELET
BASOS ABS: 0 10*3/uL (ref 0.0–0.1)
BASOS PCT: 0 % (ref 0–1)
EOS ABS: 0.1 10*3/uL (ref 0.0–0.7)
Eosinophils Relative: 1 % (ref 0–5)
HCT: 44.2 % (ref 36.0–46.0)
Hemoglobin: 14.8 g/dL (ref 12.0–15.0)
Lymphocytes Relative: 38 % (ref 12–46)
Lymphs Abs: 3.3 10*3/uL (ref 0.7–4.0)
MCH: 30.3 pg (ref 26.0–34.0)
MCHC: 33.5 g/dL (ref 30.0–36.0)
MCV: 90.4 fL (ref 78.0–100.0)
MPV: 10.4 fL (ref 8.6–12.4)
Monocytes Absolute: 0.8 10*3/uL (ref 0.1–1.0)
Monocytes Relative: 9 % (ref 3–12)
NEUTROS PCT: 52 % (ref 43–77)
Neutro Abs: 4.6 10*3/uL (ref 1.7–7.7)
PLATELETS: 246 10*3/uL (ref 150–400)
RBC: 4.89 MIL/uL (ref 3.87–5.11)
RDW: 13.1 % (ref 11.5–15.5)
WBC: 8.8 10*3/uL (ref 4.0–10.5)

## 2015-06-04 LAB — COMPLETE METABOLIC PANEL WITH GFR
ALT: 29 U/L (ref 6–29)
AST: 26 U/L (ref 10–30)
Albumin: 4.1 g/dL (ref 3.6–5.1)
Alkaline Phosphatase: 63 U/L (ref 33–115)
BILIRUBIN TOTAL: 0.5 mg/dL (ref 0.2–1.2)
BUN: 14 mg/dL (ref 7–25)
CHLORIDE: 104 mmol/L (ref 98–110)
CO2: 26 mmol/L (ref 20–31)
Calcium: 9.6 mg/dL (ref 8.6–10.2)
Creat: 0.78 mg/dL (ref 0.50–1.10)
GFR, Est African American: >89 mL/min (ref >=60)
GLUCOSE: 61 mg/dL — AB (ref 70–99)
Potassium: 4 mmol/L (ref 3.5–5.3)
SODIUM: 141 mmol/L (ref 135–146)
TOTAL PROTEIN: 6.9 g/dL (ref 6.1–8.1)

## 2015-06-04 LAB — LIPID PANEL
Cholesterol: 196 mg/dL (ref 125–200)
HDL: 39 mg/dL — AB (ref >=46)
LDL CALC: 139 mg/dL — AB (ref <130)
Total CHOL/HDL Ratio: 5 Ratio (ref <=5.0)
Triglycerides: 89 mg/dL (ref <150)
VLDL: 18 mg/dL (ref <30)

## 2015-06-04 NOTE — Progress Notes (Signed)
Subjective:    Patient ID: [name], female    DOB: [birth year], 40 y.o.   MRN: [id number]  HPI  02/07/15 Patient is here today for follow-up of her diabetes. She states that her blood sugars are typically ranging between 70 and 150. She denies any hypoglycemia. She denies any polyuria, polydipsia. She is working very hard to control her sugars. Her blood pressures well controlled today at 126/80. However her sugar this morning is low at 47. This is because she is fasting to obtain her lab work today. This is causing her heart rate to be elevated and making her feel nauseated and tremulous. Therefore I gave the patient some candy while she was sitting in the office.  At that time, my plan was: Blood sugars sound well controlled. I will make no changes in her insulin dose at the present time. I will check a hemoglobin A1c. Goal hemoglobin A1c is less than 7.0 She is also due to check a fasting lipid panel. Goal LDL cholesterol is less than 100.  Her blood pressure today is well controlled. She does want help with smoking cessation. I will give the patient a Chantix starter pack. We discussed the increased risk of nausea abnormal dreams and depression on this medication and she is willing to accept the risk. If she needs to continue the medication next month I will prescribe a continuing Dosepak. I congratulated the patient on trying to quit smoking  06/04/15 Here today for follow up.  Her insurance is requiring her to switch from Lantus to basaglar.  Her fasting blood sugars are below 130. Her two-hour postprandial sugars are less than 130 as well. She is also experiencing hypoglycemic episodes 2 hours after she eats a meal. At present she is using Lantus 70 units in the morning and 5 units of NovoLog with meals. He continues to smoke Past Medical History  Diagnosis Date  . Diabetes mellitus     19 years ago  . Muscle spasms of lower extremity     legs and weakness  . Hypercholesteremia   .  Asthma   . Type 1 diabetes mellitus ([place])   . Hypertension   . GERD (gastroesophageal reflux disease)   . Diabetic retinopathy ([place])   . Complication of anesthesia 07/2014    Mouth was swollen on inside, Front tooth was chipped  . Headache     Migraine- a long time ago  . Restless leg   . Vitreous hemorrhage, left eye ([place])   . Pneumonia   . Carpal tunnel syndrome on left   . Diabetic neuropathy [facility])    Past Surgical History  Procedure Laterality Date  . Tubal ligation  2002  . Dilation and curettage of uterus  1997  . Wisdom tooth extraction      age 83  . Cesarean section  1993, 1998, 2002  . Laser photo ablation      left x 2, right x 1  . Pars plana vitrectomy Right 08/28/2014    Procedure: PARS PLANA VITRECTOMY WITH 25 GAUGE;  Surgeon: Hayden Pedro, MD;  Location: [place];  Service: Ophthalmology;  Laterality: Right;  . Membrane peel Right 08/28/2014    Procedure: MEMBRANE PEEL;  Surgeon: Hayden Pedro, MD;  Location: [place];  Service: Ophthalmology;  Laterality: Right;  . Laser photo ablation Right 08/28/2014    Procedure: LASER PHOTO ABLATION;  Surgeon: Hayden Pedro, MD;  Location: [place];  Service: Ophthalmology;  Laterality: Right;  .  Gas/fluid exchange Right 08/28/2014    Procedure: GAS/FLUID EXCHANGE;  Surgeon: Hayden Pedro, MD;  Location: [place];  Service: Ophthalmology;  Laterality: Right;  . Pars plana vitrectomy Left 12/24/2014    Procedure: LEFT PARS PLANA VITRECTOMY WITH 25 GAUGE WITH ENDOLASER ;  Surgeon: Hurman Horn, MD;  Location: [place];  Service: Ophthalmology;  Laterality: Left;   Current Outpatient Prescriptions on File Prior to Visit  Medication Sig Dispense Refill  . albuterol (PROAIR HFA) 108 (90 BASE) MCG/ACT inhaler 2 PUFFS EVERY 6 HOURS AS NEEDED for shortness of breath 3 each 3  . albuterol (PROVENTIL) (2.5 MG/3ML) 0.083% nebulizer solution Take 3 mLs (2.5 mg total) by nebulization every 4 (four) hours as needed for wheezing or shortness of  breath. 150 mL 1  . amLODipine (NORVASC) 10 MG tablet Take 1 tablet (10 mg total) by mouth daily. 90 tablet 3  . azithromycin (ZITHROMAX) 250 MG tablet Take 2 tablets x 1 day, then 1 tab daily for 4 days 6 tablet 0  . budesonide-formoterol (SYMBICORT) 160-4.5 MCG/ACT inhaler Inhale 2 puffs into the lungs 2 (two) times daily. 3 Inhaler 3  . carvedilol (COREG) 12.5 MG tablet TAKE ONE TABLET BY MOUTH TWICE DAILY WITH  A  MEAL. 180 tablet 0  . glucose blood (ONETOUCH VERIO) test strip Check BS tid - qid 300 each 3  . guaiFENesin-codeine 100-10 MG/5ML syrup Take 5 mLs by mouth every 6 (six) hours as needed. 180 mL 0  . insulin aspart (NOVOLOG FLEXPEN) 100 UNIT/ML FlexPen INJECT 3-8 UNITS INTO THE SKIN 3 (THREE) TIMES DAILY WITH MEALS. (Sliding Scale) (Patient taking differently: Inject 3-8 Units into the skin 3 (three) times daily with meals. If blood sugar is under 100 before a full meal take 3 units, if 140 or higher before a full meal take 8 units) 45 pen 3  . Insulin Glargine (LANTUS SOLOSTAR) 100 UNIT/ML Solostar Pen Inject 70 Units into the skin daily. Give 90 day supply (Patient taking differently: Inject 70 Units into the skin daily. Give 90 day supply) 45 pen 3  . Insulin Pen Needle (BD PEN NEEDLE NANO U/F) 32G X 4 MM MISC USE AS DIRECTED 3 TIMES A DAY 300 each 3  . Lancets (ONETOUCH ULTRASOFT) lancets Use as instructed to monitor FSBS 3-4x daily d/t fluctuating FSBS. Dx: E11.65. 300 each 3  . losartan (COZAAR) 100 MG tablet Take 1 tablet (100 mg total) by mouth daily. 90 tablet 3  . OVER THE COUNTER MEDICATION Take 2 tablets by mouth at bedtime. Restless leg relief    . pantoprazole (PROTONIX) 40 MG tablet Take 1 tablet (40 mg total) by mouth daily. 90 tablet 3  . pravastatin (PRAVACHOL) 40 MG tablet Take 1 tablet (40 mg total) by mouth daily. 90 tablet 3  . predniSONE (DELTASONE) 20 MG tablet Take 1 tablet (20 mg total) by mouth daily with breakfast. 40 tablet 0  . promethazine (PHENERGAN) 25  MG suppository Place 1 suppository (25 mg total) rectally every 6 (six) hours as needed for nausea or vomiting. 12 each 0  . varenicline (CHANTIX CONTINUING MONTH PAK) 1 MG tablet Take 1 tablet (1 mg total) by mouth 2 (two) times daily. 60 tablet 1   No current facility-administered medications on file prior to visit.   Allergies  Allergen Reactions  . Fish-Derived Products Hives and Shortness Of Breath  . Demerol Itching and Rash  . Morphine And Related Itching and Rash       .  Zocor [Simvastatin] Itching and Other (See Comments)    Leg pain   Social History   Social History  . Marital Status: Married    Spouse Name: N/A  . Number of Children: N/A  . Years of Education: N/A   Occupational History  . Not on file.   Social History Main Topics  . Smoking status: Current Every Day Smoker -- 1.00 packs/day for 6 years    Types: Cigarettes  . Smokeless tobacco: Never Used  . Alcohol Use: No  . Drug Use: No  . Sexual Activity:    Partners: Male    Birth Control/ Protection: None, Surgical     Comment: tubal    Other Topics Concern  . Not on file   Social History Narrative     Review of Systems  All other systems reviewed and are negative.      Objective:   Physical Exam  Constitutional: She appears well-developed and well-nourished.  Neck: Neck supple. No thyromegaly present.  Cardiovascular: Normal rate, regular rhythm, normal heart sounds and intact distal pulses.   No murmur heard. Pulmonary/Chest: Effort normal and breath sounds normal. No respiratory distress. She has no wheezes. She has no rales.  Abdominal: Soft. Bowel sounds are normal. She exhibits no distension. There is no tenderness. There is no rebound and no guarding.  Musculoskeletal: She exhibits no edema.  Lymphadenopathy:    She has no cervical adenopathy.  Vitals reviewed.         Assessment & Plan:  Type 2 diabetes mellitus without complication, with long-term current use of insulin  (HCC) - Plan: CBC with Differential/Platelet, COMPLETE METABOLIC PANEL WITH GFR, Lipid panel, Hemoglobin A1c, Microalbumin, urine  Essential hypertension  HLD (hyperlipidemia)  Check fasting lab work. Goal hemoglobin A1c is less than 7.0. I would like the patient to discontinue rapid acting insulin with meals due to the postprandial hyperglycemia but continue Lantus/glargine 70 units daily. Her blood pressures controlled. I will also check a fasting lipid panel with a goal LDL of less than 100. I strongly encouraged smoking cessation.

## 2015-06-05 LAB — MICROALBUMIN, URINE: Microalb, Ur: 0.6 mg/dL

## 2015-06-05 LAB — HEMOGLOBIN A1C
HEMOGLOBIN A1C: 7 % — AB (ref <5.7)
Mean Plasma Glucose: 154 mg/dL — ABNORMAL HIGH (ref <117)

## 2015-08-11 ENCOUNTER — Other Ambulatory Visit: Payer: Self-pay | Admitting: Family Medicine

## 2015-08-12 ENCOUNTER — Other Ambulatory Visit: Payer: Self-pay | Admitting: Family Medicine

## 2015-08-12 ENCOUNTER — Telehealth: Payer: Self-pay | Admitting: Family Medicine

## 2015-08-12 NOTE — Telephone Encounter (Signed)
Refill appropriate and filled per protocol. 

## 2015-08-12 NOTE — Telephone Encounter (Signed)
Pt would like to know if Dr. Dennard Schaumann is going to change her insulin as previously discussed. She has dicontinued use of her fast-acting insulin and is now using slow-acting. If he does wish to change her insulin, she would like for it to be Vasoglar due to insurance coverage. [facility] [address] [phone]

## 2015-08-13 NOTE — Telephone Encounter (Signed)
Switch Lantus to basaglar 70 units daily.

## 2015-08-14 MED ORDER — BASAGLAR KWIKPEN 100 UNIT/ML ~~LOC~~ SOPN: 70.0000 [IU] | PEN_INJECTOR | Freq: Every day | SUBCUTANEOUS | Status: DC

## 2015-08-14 NOTE — Telephone Encounter (Signed)
Prescription sent to pharmacy.

## 2015-11-11 ENCOUNTER — Encounter: Payer: Self-pay | Admitting: Family Medicine

## 2015-11-11 ENCOUNTER — Ambulatory Visit (INDEPENDENT_AMBULATORY_CARE_PROVIDER_SITE_OTHER): Payer: Commercial Managed Care - HMO | Admitting: Family Medicine

## 2015-11-11 ENCOUNTER — Telehealth: Payer: Self-pay | Admitting: Family Medicine

## 2015-11-11 ENCOUNTER — Other Ambulatory Visit: Payer: Self-pay | Admitting: Family Medicine

## 2015-11-11 VITALS — BP 130/90 | HR 88 | Temp 98.6°F | Resp 16 | Ht 63.0 in | Wt 180.0 lb

## 2015-11-11 DIAGNOSIS — Z794 Long term (current) use of insulin: Secondary | ICD-10-CM | POA: Diagnosis not present

## 2015-11-11 DIAGNOSIS — E119 Type 2 diabetes mellitus without complications: Secondary | ICD-10-CM | POA: Diagnosis not present

## 2015-11-11 LAB — CBC WITH DIFFERENTIAL/PLATELET
BASOS ABS: 0 {cells}/uL (ref 0–200)
Basophils Relative: 0 %
EOS ABS: 88 {cells}/uL (ref 15–500)
Eosinophils Relative: 1 %
HEMATOCRIT: 43.4 % (ref 35.0–45.0)
Hemoglobin: 14.8 g/dL (ref 12.0–15.0)
LYMPHS ABS: 3168 {cells}/uL (ref 850–3900)
MCH: 30.3 pg (ref 27.0–33.0)
MCHC: 34.1 g/dL (ref 32.0–36.0)
MCV: 88.8 fL (ref 80.0–100.0)
MONO ABS: 704 {cells}/uL (ref 200–950)
MPV: 11.2 fL (ref 7.5–12.5)
Monocytes Relative: 8 %
NEUTROS PCT: 55 %
Neutro Abs: 4840 cells/uL (ref 1500–7800)
Platelets: 206 10*3/uL (ref 140–400)
RBC: 4.89 MIL/uL (ref 3.80–5.10)
RDW: 13.8 % (ref 11.0–15.0)
WBC: 8.8 10*3/uL (ref 3.8–10.8)

## 2015-11-11 MED ORDER — ONETOUCH ULTRASOFT LANCETS MISC: 0 refills | Status: DC

## 2015-11-11 MED ORDER — INSULIN PEN NEEDLE 32G X 4 MM MISC: 3 refills | Status: DC

## 2015-11-11 MED ORDER — BASAGLAR KWIKPEN 100 UNIT/ML ~~LOC~~ SOPN: 70.0000 [IU] | PEN_INJECTOR | Freq: Every day | SUBCUTANEOUS | 3 refills | Status: DC

## 2015-11-11 MED ORDER — LOSARTAN POTASSIUM 100 MG PO TABS: 100.0000 mg | ORAL_TABLET | Freq: Every day | ORAL | 3 refills | Status: DC

## 2015-11-11 MED ORDER — AMLODIPINE BESYLATE 10 MG PO TABS: 10.0000 mg | ORAL_TABLET | Freq: Every day | ORAL | 3 refills | Status: DC

## 2015-11-11 MED ORDER — ALBUTEROL SULFATE (2.5 MG/3ML) 0.083% IN NEBU: 2.5000 mg | INHALATION_SOLUTION | RESPIRATORY_TRACT | 1 refills | Status: DC | PRN

## 2015-11-11 MED ORDER — PANTOPRAZOLE SODIUM 40 MG PO TBEC: 40.0000 mg | DELAYED_RELEASE_TABLET | Freq: Every day | ORAL | 3 refills | Status: DC

## 2015-11-11 MED ORDER — VARENICLINE TARTRATE 0.5 MG X 11 & 1 MG X 42 PO MISC: ORAL | 0 refills | Status: DC

## 2015-11-11 MED ORDER — CARVEDILOL 12.5 MG PO TABS: 12.5000 mg | ORAL_TABLET | Freq: Two times a day (BID) | ORAL | 3 refills | Status: DC

## 2015-11-11 MED ORDER — ALBUTEROL SULFATE HFA 108 (90 BASE) MCG/ACT IN AERS: INHALATION_SPRAY | RESPIRATORY_TRACT | 3 refills | Status: DC

## 2015-11-11 MED ORDER — BUDESONIDE-FORMOTEROL FUMARATE 160-4.5 MCG/ACT IN AERO: 2.0000 | INHALATION_SPRAY | Freq: Two times a day (BID) | RESPIRATORY_TRACT | 3 refills | Status: DC

## 2015-11-11 NOTE — Telephone Encounter (Signed)
[name] stopped by to see about the prescriptions that his wife requested this morning.He knows that she needs her amlodipine, losartan, and diabetic medication He needs them called into [facility] on [address]. They are going out of town this afternoon. Dr. Dennard Schaumann walked by and said they would send them in.  CB# [phone]

## 2015-11-11 NOTE — Progress Notes (Signed)
Subjective:    Patient ID: [name], female    DOB: [birth year], 40 y.o.   MRN: [id number]  HPI 02/07/15 Patient is here today for follow-up of her diabetes. She states that her blood sugars are typically ranging between 70 and 150. She denies any hypoglycemia. She denies any polyuria, polydipsia. She is working very hard to control her sugars. Her blood pressures well controlled today at 126/80. However her sugar this morning is low at 47. This is because she is fasting to obtain her lab work today. This is causing her heart rate to be elevated and making her feel nauseated and tremulous. Therefore I gave the patient some candy while she was sitting in the office.  At that time, my plan was: Blood sugars sound well controlled. I will make no changes in her insulin dose at the present time. I will check a hemoglobin A1c. Goal hemoglobin A1c is less than 7.0 She is also due to check a fasting lipid panel. Goal LDL cholesterol is less than 100.  Her blood pressure today is well controlled. She does want help with smoking cessation. I will give the patient a Chantix starter pack. We discussed the increased risk of nausea abnormal dreams and depression on this medication and she is willing to accept the risk. If she needs to continue the medication next month I will prescribe a continuing Dosepak. I congratulated the patient on trying to quit smoking  06/04/15 Here today for follow up.  Her insurance is requiring her to switch from Lantus to basaglar.  Her fasting blood sugars are below 130. Her two-hour postprandial sugars are less than 130 as well. She is also experiencing hypoglycemic episodes 2 hours after she eats a meal. At present she is using Lantus 70 units in the morning and 5 units of NovoLog with meals. He continues to smoke.  AT that time, my plan was: Check fasting lab work. Goal hemoglobin A1c is less than 7.0. I would like the patient to discontinue rapid acting insulin with meals due to  the postprandial hyperglycemia but continue Lantus/glargine 70 units daily. Her blood pressures controlled. I will also check a fasting lipid panel with a goal LDL of less than 100. I strongly encouraged smoking cessation.  11/11/15 Patient is still smoking. She's had to take prednisone again for bronchitis with wheezing and bronchospasm. She has a history of asthma but I explained to the patient I feel she is also developing and emphysema. She really needs to quit smoking. She is interested in trying Chantix again. Her blood sugars have been very well controlled except when she is on prednisone. Typically her fasting blood sugars between 70 and 80. She is currently on long-acting insulin 35 units twice a day. She had to discontinue rapid acting insulin due to hypoglycemic episodes. Her blood pressures borderline today however this is because she has not taken her medication yet. She denies any chest pain shortness of breath or dyspnea on exertion. She denies any myalgias or right upper quadrant pain. Unfortunately she fractured her left fifth toe at the MTP joint approximately 8 weeks ago. She's been in a postop shoe since that time. The doctor that she saw his recommended she discontinue the postop shoe in the next week. I recommended the patient call me if she has pain when she comes out of the postop shoe so that we can repeat an x-ray to ensure adequate healing. Past Medical History:  Diagnosis Date  . Asthma   .  Carpal tunnel syndrome on left   . Complication of anesthesia 07/2014   Mouth was swollen on inside, Front tooth was chipped  . Diabetes mellitus    19 years ago  . Diabetic neuropathy ([place])   . Diabetic retinopathy ([place])   . GERD (gastroesophageal reflux disease)   . Headache    Migraine- a long time ago  . Hypercholesteremia   . Hypertension   . Muscle spasms of lower extremity    legs and weakness  . Pneumonia   . Restless leg   . Type 1 diabetes mellitus ([place])   . Vitreous  hemorrhage, left eye [facility])    Past Surgical History:  Procedure Laterality Date  . [place], 2002  . DILATION AND CURETTAGE OF UTERUS  1997  . GAS/FLUID EXCHANGE Right 08/28/2014   Procedure: GAS/FLUID EXCHANGE;  Surgeon: Hayden Pedro, MD;  Location: [place];  Service: Ophthalmology;  Laterality: Right;  . LASER PHOTO ABLATION     left x 2, right x 1  . LASER PHOTO ABLATION Right 08/28/2014   Procedure: LASER PHOTO ABLATION;  Surgeon: Hayden Pedro, MD;  Location: [place];  Service: Ophthalmology;  Laterality: Right;  . MEMBRANE PEEL Right 08/28/2014   Procedure: MEMBRANE PEEL;  Surgeon: Hayden Pedro, MD;  Location: [place];  Service: Ophthalmology;  Laterality: Right;  . PARS PLANA VITRECTOMY Right 08/28/2014   Procedure: PARS PLANA VITRECTOMY WITH 25 GAUGE;  Surgeon: Hayden Pedro, MD;  Location: [place];  Service: Ophthalmology;  Laterality: Right;  . PARS PLANA VITRECTOMY Left 12/24/2014   Procedure: LEFT PARS PLANA VITRECTOMY WITH 25 GAUGE WITH ENDOLASER ;  Surgeon: Hurman Horn, MD;  Location: [place];  Service: Ophthalmology;  Laterality: Left;  . TUBAL LIGATION  2002  . WISDOM TOOTH EXTRACTION     age 61   Current Outpatient Prescriptions on File Prior to Visit  Medication Sig Dispense Refill  . albuterol (PROAIR HFA) 108 (90 BASE) MCG/ACT inhaler 2 PUFFS EVERY 6 HOURS AS NEEDED for shortness of breath 3 each 3  . albuterol (PROVENTIL) (2.5 MG/3ML) 0.083% nebulizer solution Take 3 mLs (2.5 mg total) by nebulization every 4 (four) hours as needed for wheezing or shortness of breath. 150 mL 1  . amLODipine (NORVASC) 10 MG tablet Take 1 tablet (10 mg total) by mouth daily. 90 tablet 3  . budesonide-formoterol (SYMBICORT) 160-4.5 MCG/ACT inhaler Inhale 2 puffs into the lungs 2 (two) times daily. 3 Inhaler 3  . carvedilol (COREG) 12.5 MG tablet TAKE ONE TABLET BY MOUTH TWICE DAILY WITH A MEAL 180 tablet 3  . glucose blood (ONETOUCH VERIO) test strip Check BS tid - qid  300 each 3  . Insulin Glargine (BASAGLAR KWIKPEN) 100 UNIT/ML SOPN Inject 0.7 mLs (70 Units total) into the skin at bedtime. (Patient taking differently: daily at 10 pm. 26 qam and 35 qpm) 15 mL 3  . Insulin Pen Needle (BD PEN NEEDLE NANO U/F) 32G X 4 MM MISC USE AS DIRECTED 3 TIMES A DAY 300 each 3  . Lancets (ONETOUCH ULTRASOFT) lancets USE AS INSTRUCTED TO MONITOR BLOOD SUGAR 3 TO 4 TIMES DAILY 300 each 0  . losartan (COZAAR) 100 MG tablet Take 1 tablet (100 mg total) by mouth daily. 90 tablet 3  . OVER THE COUNTER MEDICATION Take 2 tablets by mouth at bedtime. Restless leg relief    . pantoprazole (PROTONIX) 40 MG tablet Take 1 tablet (40 mg total) by mouth daily.  90 tablet 3   No current facility-administered medications on file prior to visit.    Allergies  Allergen Reactions  . Fish-Derived Products Hives and Shortness Of Breath  . Demerol Itching and Rash  . Morphine And Related Itching and Rash       . Zocor [Simvastatin] Itching and Other (See Comments)    Leg pain   Social History   Social History  . Marital status: Married    Spouse name: N/A  . Number of children: N/A  . Years of education: N/A   Occupational History  . Not on file.   Social History Main Topics  . Smoking status: Current Every Day Smoker    Packs/day: 1.00    Years: 6.00    Types: Cigarettes  . Smokeless tobacco: Never Used  . Alcohol use No  . Drug use: No  . Sexual activity: Yes    Partners: Male    Birth control/ protection: None, Surgical     Comment: tubal    Other Topics Concern  . Not on file   Social History Narrative  . No narrative on file     Review of Systems  All other systems reviewed and are negative.      Objective:   Physical Exam  Constitutional: She appears well-developed and well-nourished.  Neck: Neck supple. No thyromegaly present.  Cardiovascular: Normal rate, regular rhythm, normal heart sounds and intact distal pulses.   No murmur  heard. Pulmonary/Chest: Effort normal and breath sounds normal. No respiratory distress. She has no wheezes. She has no rales.  Abdominal: Soft. Bowel sounds are normal. She exhibits no distension. There is no tenderness. There is no rebound and no guarding.  Musculoskeletal: She exhibits no edema.  Lymphadenopathy:    She has no cervical adenopathy.  Vitals reviewed.         Assessment & Plan:  Diabetes mellitus, type II, insulin dependent ([place]) - Plan: CBC with Differential/Platelet, COMPLETE METABOLIC PANEL WITH GFR, Lipid panel, Hemoglobin A1c, Microalbumin, urine  Strongly recommended smoking cessation again. I gave the patient prescription for Chantix. Her blood pressure is acceptable. I will check a hemoglobin A1c as well as a fasting lipid panel. Her goal hemoglobin A1c will be less than 7. Her goal LDL cholesterol be less than 100. Call me back if pain returns in her foot when she comes out of the postop shoe. The present time she is pain-free.

## 2015-11-11 NOTE — Telephone Encounter (Signed)
Meds sent to pharm by Dr. Mamie Nick

## 2015-11-12 LAB — COMPLETE METABOLIC PANEL WITH GFR
ALT: 19 U/L (ref 6–29)
AST: 18 U/L (ref 10–30)
Albumin: 4.2 g/dL (ref 3.6–5.1)
Alkaline Phosphatase: 61 U/L (ref 33–115)
BUN: 16 mg/dL (ref 7–25)
CALCIUM: 9.6 mg/dL (ref 8.6–10.2)
CHLORIDE: 105 mmol/L (ref 98–110)
CO2: 29 mmol/L (ref 20–31)
CREATININE: 0.74 mg/dL (ref 0.50–1.10)
GFR, Est Non African American: >89 mL/min (ref >=60)
Glucose, Bld: 66 mg/dL — ABNORMAL LOW (ref 70–99)
Potassium: 4.1 mmol/L (ref 3.5–5.3)
Sodium: 142 mmol/L (ref 135–146)
Total Bilirubin: 0.5 mg/dL (ref 0.2–1.2)
Total Protein: 6.8 g/dL (ref 6.1–8.1)

## 2015-11-12 LAB — HEMOGLOBIN A1C
Hgb A1c MFr Bld: 6.4 % — ABNORMAL HIGH (ref <5.7)
Mean Plasma Glucose: 137 mg/dL

## 2015-11-12 LAB — MICROALBUMIN, URINE: Microalb, Ur: 0.7 mg/dL

## 2015-11-12 LAB — LIPID PANEL
CHOLESTEROL: 204 mg/dL — AB (ref 125–200)
HDL: 33 mg/dL — ABNORMAL LOW (ref >=46)
LDL Cholesterol: 149 mg/dL — ABNORMAL HIGH (ref <130)
TRIGLYCERIDES: 109 mg/dL (ref <150)
Total CHOL/HDL Ratio: 6.2 Ratio — ABNORMAL HIGH (ref <=5.0)
VLDL: 22 mg/dL (ref <30)

## 2015-11-15 ENCOUNTER — Other Ambulatory Visit: Payer: Self-pay

## 2015-11-15 DIAGNOSIS — E785 Hyperlipidemia, unspecified: Secondary | ICD-10-CM

## 2015-11-15 MED ORDER — ATORVASTATIN CALCIUM 20 MG PO TABS: 20.0000 mg | ORAL_TABLET | Freq: Every day | ORAL | 3 refills | Status: DC

## 2015-11-21 ENCOUNTER — Encounter: Payer: Self-pay | Admitting: Family Medicine

## 2015-11-21 ENCOUNTER — Ambulatory Visit (INDEPENDENT_AMBULATORY_CARE_PROVIDER_SITE_OTHER): Payer: Commercial Managed Care - HMO | Admitting: Family Medicine

## 2015-11-21 VITALS — BP 160/86 | HR 100 | Temp 98.6°F | Resp 16 | Ht 63.0 in | Wt 181.0 lb

## 2015-11-21 DIAGNOSIS — R0789 Other chest pain: Secondary | ICD-10-CM | POA: Diagnosis not present

## 2015-11-21 MED ORDER — CARVEDILOL 12.5 MG PO TABS: 25.0000 mg | ORAL_TABLET | Freq: Two times a day (BID) | ORAL | 3 refills | Status: DC

## 2015-11-21 MED ORDER — GLUCOSE BLOOD VI STRP: ORAL_STRIP | 3 refills | Status: DC

## 2015-11-21 MED ORDER — ONETOUCH ULTRASOFT LANCETS MISC: 0 refills | Status: DC

## 2015-11-21 NOTE — Progress Notes (Signed)
Subjective:    Patient ID: [name], female    DOB: December 17, 1975, 40 y.o.   MRN: [id number]  Medication Refill    02/07/15 Patient is here today for follow-up of her diabetes. She states that her blood sugars are typically ranging between 70 and 150. She denies any hypoglycemia. She denies any polyuria, polydipsia. She is working very hard to control her sugars. Her blood pressures well controlled today at 126/80. However her sugar this morning is low at 47. This is because she is fasting to obtain her lab work today. This is causing her heart rate to be elevated and making her feel nauseated and tremulous. Therefore I gave the patient some candy while she was sitting in the office.  At that time, my plan was: Blood sugars sound well controlled. I will make no changes in her insulin dose at the present time. I will check a hemoglobin A1c. Goal hemoglobin A1c is less than 7.0 She is also due to check a fasting lipid panel. Goal LDL cholesterol is less than 100.  Her blood pressure today is well controlled. She does want help with smoking cessation. I will give the patient a Chantix starter pack. We discussed the increased risk of nausea abnormal dreams and depression on this medication and she is willing to accept the risk. If she needs to continue the medication next month I will prescribe a continuing Dosepak. I congratulated the patient on trying to quit smoking  06/04/15 Here today for follow up.  Her insurance is requiring her to switch from Lantus to basaglar.  Her fasting blood sugars are below 130. Her two-hour postprandial sugars are less than 130 as well. She is also experiencing hypoglycemic episodes 2 hours after she eats a meal. At present she is using Lantus 70 units in the morning and 5 units of NovoLog with meals. He continues to smoke.  AT that time, my plan was: Check fasting lab work. Goal hemoglobin A1c is less than 7.0. I would like the patient to discontinue rapid acting insulin  with meals due to the postprandial hyperglycemia but continue Lantus/glargine 70 units daily. Her blood pressures controlled. I will also check a fasting lipid panel with a goal LDL of less than 100. I strongly encouraged smoking cessation.  11/11/15 Patient is still smoking. She's had to take prednisone again for bronchitis with wheezing and bronchospasm. She has a history of asthma but I explained to the patient I feel she is also developing and emphysema. She really needs to quit smoking. She is interested in trying Chantix again. Her blood sugars have been very well controlled except when she is on prednisone. Typically her fasting blood sugars between 70 and 80. She is currently on long-acting insulin 35 units twice a day. She had to discontinue rapid acting insulin due to hypoglycemic episodes. Her blood pressures borderline today however this is because she has not taken her medication yet. She denies any chest pain shortness of breath or dyspnea on exertion. She denies any myalgias or right upper quadrant pain. Unfortunately she fractured her left fifth toe at the MTP joint approximately 8 weeks ago. She's been in a postop shoe since that time. The doctor that she saw his recommended she discontinue the postop shoe in the next week. I recommended the patient call me if she has pain when she comes out of the postop shoe so that we can repeat an x-ray to ensure adequate healing.  AT that time, my plan was:  Strongly recommended smoking cessation again. I gave the patient prescription for Chantix. Her blood pressure is acceptable. I will check a hemoglobin A1c as well as a fasting lipid panel. Her goal hemoglobin A1c will be less than 7. Her goal LDL cholesterol be less than 100. Call me back if pain returns in her foot when she comes out of the postop shoe. The present time she is pain-free.  11/21/15 Had 2 episodes of atypical chest pain. Both occurred at rest while changing diapers with minimal  exertion. She describes it as a pressure-like sensation in the right and left chest in a bandlike pattern. The pressure-like sensation lasted for a few minutes and then resolve spontaneously. While it occurred she felt short of breath however she denies any other episodes of chest pain with activity. She does yard work without chest pain. She denies any chest pain or shortness of breath with walking or sexual activity. EKG today shows sinus tachycardia but there is no evidence of ischemia or infarction. She continues to smoke. She has a long history of uncontrolled diabetes although she has done very well in controlling that as of late. Her blood pressure is elevated today. Her cholesterol is also elevated as was discovered on her recent lab work.. Past Medical History:  Diagnosis Date  . Asthma   . Carpal tunnel syndrome on left   . Complication of anesthesia 07/2014   Mouth was swollen on inside, Front tooth was chipped  . Diabetes mellitus    19 years ago  . Diabetic neuropathy ([place])   . Diabetic retinopathy ([place])   . GERD (gastroesophageal reflux disease)   . Headache    Migraine- a long time ago  . Hypercholesteremia   . Hypertension   . Muscle spasms of lower extremity    legs and weakness  . Pneumonia   . Restless leg   . Type 1 diabetes mellitus ([place])   . Vitreous hemorrhage, left eye [facility])    Past Surgical History:  Procedure Laterality Date  . [place], 2002  . DILATION AND CURETTAGE OF UTERUS  1997  . GAS/FLUID EXCHANGE Right 08/28/2014   Procedure: GAS/FLUID EXCHANGE;  Surgeon: Hayden Pedro, MD;  Location: [place];  Service: Ophthalmology;  Laterality: Right;  . LASER PHOTO ABLATION     left x 2, right x 1  . LASER PHOTO ABLATION Right 08/28/2014   Procedure: LASER PHOTO ABLATION;  Surgeon: Hayden Pedro, MD;  Location: [place];  Service: Ophthalmology;  Laterality: Right;  . MEMBRANE PEEL Right 08/28/2014   Procedure: MEMBRANE PEEL;  Surgeon: Hayden Pedro, MD;  Location: [place];  Service: Ophthalmology;  Laterality: Right;  . PARS PLANA VITRECTOMY Right 08/28/2014   Procedure: PARS PLANA VITRECTOMY WITH 25 GAUGE;  Surgeon: Hayden Pedro, MD;  Location: [place];  Service: Ophthalmology;  Laterality: Right;  . PARS PLANA VITRECTOMY Left 12/24/2014   Procedure: LEFT PARS PLANA VITRECTOMY WITH 25 GAUGE WITH ENDOLASER ;  Surgeon: Hurman Horn, MD;  Location: [place];  Service: Ophthalmology;  Laterality: Left;  . TUBAL LIGATION  2002  . WISDOM TOOTH EXTRACTION     age 72   Current Outpatient Prescriptions on File Prior to Visit  Medication Sig Dispense Refill  . albuterol (PROAIR HFA) 108 (90 Base) MCG/ACT inhaler 2 PUFFS EVERY 6 HOURS AS NEEDED for shortness of breath 3 each 3  . albuterol (PROVENTIL) (2.5 MG/3ML) 0.083% nebulizer solution Take 3 mLs (2.5 mg  total) by nebulization every 4 (four) hours as needed for wheezing or shortness of breath. 150 mL 1  . amLODipine (NORVASC) 10 MG tablet Take 1 tablet (10 mg total) by mouth daily. 90 tablet 3  . atorvastatin (LIPITOR) 20 MG tablet Take 1 tablet (20 mg total) by mouth daily. 90 tablet 3  . budesonide-formoterol (SYMBICORT) 160-4.5 MCG/ACT inhaler Inhale 2 puffs into the lungs 2 (two) times daily. 3 Inhaler 3  . Insulin Glargine (BASAGLAR KWIKPEN) 100 UNIT/ML SOPN Inject 0.7 mLs (70 Units total) into the skin at bedtime. 60 mL 3  . Insulin Pen Needle (BD PEN NEEDLE NANO U/F) 32G X 4 MM MISC USE AS DIRECTED 3 TIMES A DAY 300 each 3  . losartan (COZAAR) 100 MG tablet Take 1 tablet (100 mg total) by mouth daily. 90 tablet 3  . OVER THE COUNTER MEDICATION Take 2 tablets by mouth at bedtime. Restless leg relief    . pantoprazole (PROTONIX) 40 MG tablet Take 1 tablet (40 mg total) by mouth daily. 90 tablet 3  . varenicline (CHANTIX STARTING MONTH PAK) 0.5 MG X 11 & 1 MG X 42 tablet Take one 0.5 mg tablet by mouth once daily for 3 days, then increase to one 0.5 mg tablet twice daily for 4 days, then  increase to one 1 mg tablet twice daily. (Patient not taking: Reported on 11/21/2015) 53 tablet 0   No current facility-administered medications on file prior to visit.    Allergies  Allergen Reactions  . Fish-Derived Products Hives and Shortness Of Breath  . Demerol Itching and Rash  . Morphine And Related Itching and Rash       . Zocor [Simvastatin] Itching and Other (See Comments)    Leg pain   Social History   Social History  . Marital status: Married    Spouse name: N/A  . Number of children: N/A  . Years of education: N/A   Occupational History  . Not on file.   Social History Main Topics  . Smoking status: Current Every Day Smoker    Packs/day: 1.00    Years: 6.00    Types: Cigarettes  . Smokeless tobacco: Never Used  . Alcohol use No  . Drug use: No  . Sexual activity: Yes    Partners: Male    Birth control/ protection: None, Surgical     Comment: tubal    Other Topics Concern  . Not on file   Social History Narrative  . No narrative on file     Review of Systems  All other systems reviewed and are negative.      Objective:   Physical Exam  Constitutional: She appears well-developed and well-nourished.  Neck: Neck supple. No thyromegaly present.  Cardiovascular: Normal rate, regular rhythm, normal heart sounds and intact distal pulses.   No murmur heard. Pulmonary/Chest: Effort normal and breath sounds normal. No respiratory distress. She has no wheezes. She has no rales.  Abdominal: Soft. Bowel sounds are normal. She exhibits no distension. There is no tenderness. There is no rebound and no guarding.  Musculoskeletal: She exhibits no edema.  Lymphadenopathy:    She has no cervical adenopathy.  Vitals reviewed.         Assessment & Plan:  Other chest pain - Plan: EKG 12-Lead, Ambulatory referral to Cardiology Her chest pain is somewhat atypical however she has numerous risk factors including insulin-dependent diabetes mellitus,  hypertension, hyperlipidemia, and tobacco abuse. Therefore I would like to arrange for her  to see a cardiologist as soon as possible for stress test. In the meantime I will her to increase her carvedilol to 25 mg by mouth twice a day to address her elevated blood pressure as well as her tachycardia and I continue to encourage her to quit smoking. She is to go the hospital immediately should she develop chest pain at rest. Today she is pain-free and there is no evidence of ACS at the present time

## 2015-11-25 ENCOUNTER — Telehealth: Payer: Self-pay | Admitting: *Deleted

## 2015-11-25 NOTE — Telephone Encounter (Signed)
Received call from patient.   Patient inquiring as to the status of the Stress Test PCP ordered.   Please contact patient to advise at [phone].

## 2015-11-29 NOTE — Telephone Encounter (Signed)
Pt made aware of Cardiology referral.  Will be seen by them and they will order any studies needed.

## 2015-12-09 ENCOUNTER — Encounter (HOSPITAL_COMMUNITY): Payer: Commercial Managed Care - HMO

## 2015-12-20 ENCOUNTER — Encounter: Payer: Self-pay | Admitting: Cardiovascular Disease

## 2015-12-23 ENCOUNTER — Ambulatory Visit (INDEPENDENT_AMBULATORY_CARE_PROVIDER_SITE_OTHER): Payer: Commercial Managed Care - HMO | Admitting: Cardiovascular Disease

## 2015-12-23 ENCOUNTER — Encounter: Payer: Self-pay | Admitting: *Deleted

## 2015-12-23 ENCOUNTER — Encounter: Payer: Self-pay | Admitting: Cardiovascular Disease

## 2015-12-23 VITALS — BP 115/79 | HR 94 | Ht 63.0 in | Wt 177.6 lb

## 2015-12-23 DIAGNOSIS — I10 Essential (primary) hypertension: Secondary | ICD-10-CM

## 2015-12-23 DIAGNOSIS — R002 Palpitations: Secondary | ICD-10-CM

## 2015-12-23 DIAGNOSIS — E785 Hyperlipidemia, unspecified: Secondary | ICD-10-CM

## 2015-12-23 DIAGNOSIS — I209 Angina pectoris, unspecified: Secondary | ICD-10-CM | POA: Diagnosis not present

## 2015-12-23 DIAGNOSIS — R000 Tachycardia, unspecified: Secondary | ICD-10-CM

## 2015-12-23 HISTORY — DX: Essential (primary) hypertension: I10

## 2015-12-23 HISTORY — DX: Hyperlipidemia, unspecified: E78.5

## 2015-12-23 LAB — CBC WITH DIFFERENTIAL/PLATELET
BASOS ABS: 0 {cells}/uL (ref 0–200)
Basophils Relative: 0 %
EOS ABS: 90 {cells}/uL (ref 15–500)
EOS PCT: 1 %
HEMATOCRIT: 42.1 % (ref 35.0–45.0)
HEMOGLOBIN: 14.2 g/dL (ref 11.7–15.5)
LYMPHS ABS: 3510 {cells}/uL (ref 850–3900)
Lymphocytes Relative: 39 %
MCH: 30.5 pg (ref 27.0–33.0)
MCHC: 33.7 g/dL (ref 32.0–36.0)
MCV: 90.5 fL (ref 80.0–100.0)
MONO ABS: 360 {cells}/uL (ref 200–950)
MPV: 11.4 fL (ref 7.5–12.5)
Monocytes Relative: 4 %
NEUTROS ABS: 5040 {cells}/uL (ref 1500–7800)
Neutrophils Relative %: 56 %
Platelets: 195 10*3/uL (ref 140–400)
RBC: 4.65 MIL/uL (ref 3.80–5.10)
RDW: 12.7 % (ref 11.0–15.0)
WBC: 9 10*3/uL (ref 3.8–10.8)

## 2015-12-23 LAB — BASIC METABOLIC PANEL
BUN: 11 mg/dL (ref 7–25)
CHLORIDE: 103 mmol/L (ref 98–110)
CO2: 27 mmol/L (ref 20–31)
Calcium: 9.2 mg/dL (ref 8.6–10.2)
Creat: 0.8 mg/dL (ref 0.50–1.10)
GLUCOSE: 255 mg/dL — AB (ref 65–99)
POTASSIUM: 4.5 mmol/L (ref 3.5–5.3)
SODIUM: 136 mmol/L (ref 135–146)

## 2015-12-23 LAB — TSH: TSH: 1.09 m[IU]/L

## 2015-12-23 LAB — T4, FREE: Free T4: 1.2 ng/dL (ref 0.8–1.8)

## 2015-12-23 LAB — MAGNESIUM: Magnesium: 2 mg/dL (ref 1.5–2.5)

## 2015-12-23 NOTE — Patient Instructions (Addendum)
Medication Instructions:  START ASPIRIN 81 MG DAILY   Labwork: BMET/CBC/PT/INR/TSH/FT4/D-DIMER AT [facility] ON THE FIRST FLOOR  Testing/Procedures: Your physician has requested that you have a cardiac catheterization. Cardiac catheterization is used to diagnose and/or treat various heart conditions. Doctors may recommend this procedure for a number of different reasons. The most common reason is to evaluate chest pain. Chest pain can be a symptom of coronary artery disease (CAD), and cardiac catheterization can show whether plaque is narrowing or blocking your heart's arteries. This procedure is also used to evaluate the valves, as well as measure the blood flow and oxygen levels in different parts of your heart. For further information please visit [email or web address]. Please follow instruction sheet, as given.  Your physician has recommended that you wear a holter monitor. Holter monitors are medical devices that record the heart's electrical activity. Doctors most often use these monitors to diagnose arrhythmias. Arrhythmias are problems with the speed or rhythm of the heartbeat. The monitor is a small, portable device. You can wear one while you do your normal daily activities. This is usually used to diagnose what is causing palpitations/syncope (passing out). [place]  Follow-Up: Your physician recommends that you schedule a follow-up appointment in: [place]  If you need a refill on your cardiac medications before your next appointment, please call your pharmacy.

## 2015-12-23 NOTE — Progress Notes (Signed)
Cardiology Office Note   Date:  12/23/2015   ID:  [name], DOB August 30, 1975, MRN [id number]  PCP:  Odette Fraction, MD  Cardiologist:   Skeet Latch, MD   Chief Complaint  Patient presents with  . New Patient (Initial Visit)    sob; wehn exerting self. pressure in chest. lightheaded/dizziness. restless leg syndrome.       History of Present Illness: [name] is a 40 y.o. female with asthma, diabetes, hyperlipidemia, and tobacco abuse who presents for an evaluation of chest pain.  [name] saw Dr. Jenna Luo on 11/21/15.  At that appointment she reported two episodes of chest pain.  She had an EKG that revealed sinus tachycardia, rate 101 bpm.  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 She is very active and takes care of the 1-63-year-olds. She notes that while chasing after the children she has been having episodes of chest heaviness. The episodes are associated with shortness of breath, nausea, and diaphoresis. She also sometimes notes numbness in her left arm.  The episodes last for several minutes and improved with rest. After seeing Dr. Dennard Schaumann she has not been working. However, she notes that the chest pain recurs when she tries to do tasks such as dusting her home.  She also notes episodes of dizziness that are especially prominent when bending over or when she stands up after sitting for prolonged periods of time. She denies any syncope.  She also reports episodes of heart racing. This occurs separately from the episodes of chest pain. Her heart races for up to one hour per day. It is worse with exertion. She does not drink much caffeine or use illicit drugs. It is associated with shortness of breath but no chest pain, lightheadedness, or dizziness. She does feel very fatigued when it occurs.  Based on the symptoms Dr. Dennard Schaumann increased her carvedilol. She does not think that this is helped.   Ms.  Hunt was diagnosed with diabetes at age 40. For many years it was not  well-controlled. However, for the last 3 years it is been much better. Her most recent hemoglobin A1c was 6.6%.  She has smoked for many years and recently tried to stop with the use of Chantix. However, she was unsuccessful.   She currently smokes one half pack per day    Past Medical History:  Diagnosis Date  . Asthma   . Carpal tunnel syndrome on left   . Complication of anesthesia 07/2014   Mouth was swollen on inside, Front tooth was chipped  . Diabetes mellitus    19 years ago  . Diabetic neuropathy ([place])   . Diabetic retinopathy ([place])   . GERD (gastroesophageal reflux disease)   . Headache    Migraine- a long time ago  . Hypercholesteremia   . Hypertension   . Muscle spasms of lower extremity    legs and weakness  . Pneumonia   . Restless leg   . Type 1 diabetes mellitus ([place])   . Vitreous hemorrhage, left eye [facility])     Past Surgical History:  Procedure Laterality Date  . [place], 2002  . DILATION AND CURETTAGE OF UTERUS  1997  . GAS/FLUID EXCHANGE Right 08/28/2014   Procedure: GAS/FLUID EXCHANGE;  Surgeon: Hayden Pedro, MD;  Location: [place];  Service: Ophthalmology;  Laterality: Right;  . LASER PHOTO ABLATION     left x 2, right x 1  . LASER PHOTO ABLATION Right 08/28/2014  Procedure: LASER PHOTO ABLATION;  Surgeon: Hayden Pedro, MD;  Location: [place];  Service: Ophthalmology;  Laterality: Right;  . MEMBRANE PEEL Right 08/28/2014   Procedure: MEMBRANE PEEL;  Surgeon: Hayden Pedro, MD;  Location: [place];  Service: Ophthalmology;  Laterality: Right;  . PARS PLANA VITRECTOMY Right 08/28/2014   Procedure: PARS PLANA VITRECTOMY WITH 25 GAUGE;  Surgeon: Hayden Pedro, MD;  Location: [place];  Service: Ophthalmology;  Laterality: Right;  . PARS PLANA VITRECTOMY Left 12/24/2014   Procedure: LEFT PARS PLANA VITRECTOMY WITH 25 GAUGE WITH ENDOLASER ;  Surgeon: Hurman Horn, MD;  Location: [place];  Service: Ophthalmology;  Laterality: Left;  . TUBAL  LIGATION  2002  . WISDOM TOOTH EXTRACTION     age 10     Current Outpatient Prescriptions  Medication Sig Dispense Refill  . albuterol (PROAIR HFA) 108 (90 Base) MCG/ACT inhaler 2 PUFFS EVERY 6 HOURS AS NEEDED for shortness of breath 3 each 3  . albuterol (PROVENTIL) (2.5 MG/3ML) 0.083% nebulizer solution Take 3 mLs (2.5 mg total) by nebulization every 4 (four) hours as needed for wheezing or shortness of breath. 150 mL 1  . amLODipine (NORVASC) 10 MG tablet Take 1 tablet (10 mg total) by mouth daily. 90 tablet 3  . atorvastatin (LIPITOR) 20 MG tablet Take 1 tablet (20 mg total) by mouth daily. 90 tablet 3  . carvedilol (COREG) 12.5 MG tablet Take 2 tablets (25 mg total) by mouth 2 (two) times daily with a meal. 360 tablet 3  . glucose blood (ONETOUCH VERIO) test strip Check BS tid - qid 300 each 3  . Insulin Glargine (BASAGLAR KWIKPEN) 100 UNIT/ML SOPN Inject 0.7 mLs (70 Units total) into the skin at bedtime. 60 mL 3  . Lancets (ONETOUCH ULTRASOFT) lancets USE AS INSTRUCTED TO MONITOR BLOOD SUGAR 3 TO 4 TIMES DAILY 300 each 0  . losartan (COZAAR) 100 MG tablet Take 1 tablet (100 mg total) by mouth daily. 90 tablet 3  . OVER THE COUNTER MEDICATION Take 2 tablets by mouth at bedtime. Restless leg relief    . pantoprazole (PROTONIX) 40 MG tablet Take 1 tablet (40 mg total) by mouth daily. 90 tablet 3  . varenicline (CHANTIX STARTING MONTH PAK) 0.5 MG X 11 & 1 MG X 42 tablet Take one 0.5 mg tablet by mouth once daily for 3 days, then increase to one 0.5 mg tablet twice daily for 4 days, then increase to one 1 mg tablet twice daily. 53 tablet 0   No current facility-administered medications for this visit.     Allergies:   Fish-derived products; Demerol; Morphine and related; and Zocor [simvastatin]    Social History:  The patient  reports that she has been smoking Cigarettes.  She has a 6.00 pack-year smoking history. She has never used smokeless tobacco. She reports that she does not drink  alcohol or use drugs.   Family History:  The patient's family history includes Diabetes in her brother, father, and mother; Hypertension in her father; Parkinsonism in her mother.    ROS:  Please see the history of present illness.   Otherwise, review of systems are positive for fatigue.   All other systems are reviewed and negative.    PHYSICAL EXAM: VS:  BP 115/79   Pulse 94   Ht 5\' 3"  (1.6 m)   Wt 177 lb 9.6 oz (80.6 kg)   BMI 31.46 kg/m  , BMI Body mass index is 31.46 kg/m. GENERAL:  Well appearing HEENT:  Pupils equal round and reactive, fundi not visualized, oral mucosa unremarkable NECK:  No jugular venous distention, waveform within normal limits, carotid upstroke brisk and symmetric, no bruits, no thyromegaly LYMPHATICS:  No cervical adenopathy LUNGS:  Clear to auscultation bilaterally HEART:  RRR.  PMI not displaced or sustained,S1 and S2 within normal limits, no S3, no S4, no clicks, no rubs, no murmurs ABD:  Flat, positive bowel sounds normal in frequency in pitch, no bruits, no rebound, no guarding, no midline pulsatile mass, no hepatomegaly, no splenomegaly EXT:  2 plus pulses throughout, no edema, no cyanosis no clubbing SKIN:  No rashes no nodules NEURO:  Cranial nerves II through XII grossly intact, motor grossly intact throughout PSYCH:  Cognitively intact, oriented to person place and time  EKG:  EKG is ordered today. The ekg ordered today demonstrates sinus rhythm rate 94 bpm   Recent Labs: 11/11/2015: ALT 19; BUN 16; Creat 0.74; Hemoglobin 14.8; Platelets 206; Potassium 4.1; Sodium 142    Lipid Panel    Component Value Date/Time   CHOL 204 (H) 11/11/2015 0928   TRIG 109 11/11/2015 0928   HDL 33 (L) 11/11/2015 0928   CHOLHDL 6.2 (H) 11/11/2015 0928   VLDL 22 11/11/2015 0928   LDLCALC 149 (H) 11/11/2015 0928      Wt Readings from Last 3 Encounters:  12/23/15 177 lb 9.6 oz (80.6 kg)  11/21/15 181 lb (82.1 kg)  11/11/15 180 lb (81.6 kg)       ASSESSMENT AND PLAN:  # Angina: Ms. Merola has classic symptoms Of angina. She has several risk factors, including long-standing diabetes, hypertension, and hyperlipidemia. Her lipids have not been well-controlled. Therefore, we will refer her for left heart catheterization. She will also start aspirin 81 mg daily. Continue carvedilol.  # Hypertension: Blood pressure is well-controlled on amlodipine, carvedilol, and losartan.  # Hyperlipidemia:  Ms. Becks is not currently taking atorvastatin.  She had myalgias with both atorvastatin and simvastatin. Her lipids were not well-controlled on 11/11/15. We will wait until getting the data from her cardiac catheterization before determining what to do. She will likely need referral to our lipid clinic for consideration of a PCSK-9 inhibitor if she is not able to tolerate rosuvastatin.  # Palpitations: Ms. Vander reports recurrent palpitations occurring almost daily. We will obtain a 48 hour Holter. We will also check a CBC, a significant metabolic and oh, magnesium, and thyroid function. She has had episodes of resting tachycardia and is an active smoker. Therefore, we will check a D-dimer to evaluate for pulmonary embolism.   #Tobacco abuse: Ms. Schlender was encouraged to stop smoking.  She already has a prescription for Chantix from Dr. Dennard Schaumann.  She isn't yet ready to quit.   Current medicines are reviewed at length with the patient today.  The patient does not have concerns regarding medicines.  The following changes have been made:  Start aspirin 81 mg daily   Labs/ tests ordered today include:  No orders of the defined types were placed in this encounter.    Disposition:   FU with Tynan Boesel C. Oval Linsey, MD, [facility] in 1 month    This note was written with the assistance of speech recognition software.  Please excuse any transcriptional errors.  Signed, Margi Edmundson C. Oval Linsey, MD, [facility]  12/23/2015 1:42 PM    [facility]

## 2015-12-24 LAB — PROTIME-INR
INR: 1
Prothrombin Time: 10.2 s (ref 9.0–11.5)

## 2015-12-24 LAB — D-DIMER, QUANTITATIVE (NOT AT ARMC): D DIMER QUANT: 0.22 ug{FEU}/mL (ref <0.50)

## 2015-12-24 NOTE — Addendum Note (Signed)
Addended by: Skeet Latch C on: 12/24/2015 09:39 PM   Modules accepted: Orders, SmartSet

## 2015-12-27 ENCOUNTER — Encounter (HOSPITAL_COMMUNITY): Payer: Self-pay | Admitting: Cardiovascular Disease

## 2015-12-27 ENCOUNTER — Ambulatory Visit (HOSPITAL_COMMUNITY)
Admission: RE | Admit: 2015-12-27 | Discharge: 2015-12-27 | Disposition: A | Discharge status: Home / Self Care | Payer: Commercial Managed Care - HMO | Source: Ambulatory Visit | Attending: Cardiovascular Disease | Admitting: Cardiovascular Disease

## 2015-12-27 ENCOUNTER — Encounter (HOSPITAL_COMMUNITY)
Admission: RE | Disposition: A | Discharge status: Home / Self Care | Payer: Self-pay | Source: Ambulatory Visit | Attending: Cardiovascular Disease

## 2015-12-27 DIAGNOSIS — Z8249 Family history of ischemic heart disease and other diseases of the circulatory system: Secondary | ICD-10-CM | POA: Diagnosis not present

## 2015-12-27 DIAGNOSIS — K219 Gastro-esophageal reflux disease without esophagitis: Secondary | ICD-10-CM | POA: Diagnosis not present

## 2015-12-27 DIAGNOSIS — F17210 Nicotine dependence, cigarettes, uncomplicated: Secondary | ICD-10-CM | POA: Insufficient documentation

## 2015-12-27 DIAGNOSIS — I209 Angina pectoris, unspecified: Secondary | ICD-10-CM

## 2015-12-27 DIAGNOSIS — G5602 Carpal tunnel syndrome, left upper limb: Secondary | ICD-10-CM | POA: Diagnosis not present

## 2015-12-27 DIAGNOSIS — J45909 Unspecified asthma, uncomplicated: Secondary | ICD-10-CM | POA: Diagnosis not present

## 2015-12-27 DIAGNOSIS — E785 Hyperlipidemia, unspecified: Secondary | ICD-10-CM | POA: Diagnosis not present

## 2015-12-27 DIAGNOSIS — E10319 Type 1 diabetes mellitus with unspecified diabetic retinopathy without macular edema: Secondary | ICD-10-CM | POA: Diagnosis not present

## 2015-12-27 DIAGNOSIS — R079 Chest pain, unspecified: Secondary | ICD-10-CM | POA: Diagnosis not present

## 2015-12-27 DIAGNOSIS — G2581 Restless legs syndrome: Secondary | ICD-10-CM | POA: Insufficient documentation

## 2015-12-27 DIAGNOSIS — I10 Essential (primary) hypertension: Secondary | ICD-10-CM | POA: Insufficient documentation

## 2015-12-27 DIAGNOSIS — R002 Palpitations: Secondary | ICD-10-CM | POA: Diagnosis not present

## 2015-12-27 DIAGNOSIS — E1040 Type 1 diabetes mellitus with diabetic neuropathy, unspecified: Secondary | ICD-10-CM | POA: Insufficient documentation

## 2015-12-27 DIAGNOSIS — Z833 Family history of diabetes mellitus: Secondary | ICD-10-CM | POA: Diagnosis not present

## 2015-12-27 DIAGNOSIS — E7800 Pure hypercholesterolemia, unspecified: Secondary | ICD-10-CM | POA: Diagnosis not present

## 2015-12-27 HISTORY — PX: CARDIAC CATHETERIZATION: SHX172

## 2015-12-27 LAB — CARDIAC CATHETERIZATION: Cath EF Quantitative: 55 %

## 2015-12-27 LAB — GLUCOSE, CAPILLARY
GLUCOSE-CAPILLARY: 76 mg/dL (ref 65–99)
Glucose-Capillary: 155 mg/dL — ABNORMAL HIGH (ref 65–99)

## 2015-12-27 LAB — HCG, SERUM, QUALITATIVE: Preg, Serum: NEGATIVE

## 2015-12-27 SURGERY — LEFT HEART CATH AND CORONARY ANGIOGRAPHY

## 2015-12-27 MED ORDER — ONDANSETRON HCL 4 MG/2ML IJ SOLN: 4.0000 mg | Freq: Four times a day (QID) | INTRAMUSCULAR | Status: DC | PRN

## 2015-12-27 MED ORDER — HEPARIN (PORCINE) IN NACL 2-0.9 UNIT/ML-% IJ SOLN
INTRAMUSCULAR | Status: DC | PRN
  Administered 2015-12-27: 1000 mL

## 2015-12-27 MED ORDER — SODIUM CHLORIDE 0.9 % IV SOLN: 250.0000 mL | INTRAVENOUS | Status: DC | PRN

## 2015-12-27 MED ORDER — MIDAZOLAM HCL 2 MG/2ML IJ SOLN
INTRAMUSCULAR | Status: AC
Start: 2015-12-27 — End: 2015-12-27
  Filled 2015-12-27: qty 2

## 2015-12-27 MED ORDER — IOPAMIDOL (ISOVUE-370) INJECTION 76%
INTRAVENOUS | Status: AC
  Filled 2015-12-27: qty 100

## 2015-12-27 MED ORDER — SODIUM CHLORIDE 0.9% FLUSH: 3.0000 mL | Freq: Two times a day (BID) | INTRAVENOUS | Status: DC

## 2015-12-27 MED ORDER — ASPIRIN 81 MG PO CHEW
CHEWABLE_TABLET | ORAL | Status: AC
  Administered 2015-12-27: 81 mg via ORAL
  Filled 2015-12-27: qty 1

## 2015-12-27 MED ORDER — LIDOCAINE HCL (PF) 1 % IJ SOLN
INTRAMUSCULAR | Status: DC | PRN
  Administered 2015-12-27: 2 mL
  Administered 2015-12-27: 20 mL
  Administered 2015-12-27: 10 mL

## 2015-12-27 MED ORDER — SODIUM CHLORIDE 0.9% FLUSH: 3.0000 mL | INTRAVENOUS | Status: DC | PRN

## 2015-12-27 MED ORDER — SODIUM CHLORIDE 0.9 % WEIGHT BASED INFUSION: 1.0000 mL/kg/h | INTRAVENOUS | Status: DC

## 2015-12-27 MED ORDER — HEPARIN (PORCINE) IN NACL 2-0.9 UNIT/ML-% IJ SOLN
INTRAMUSCULAR | Status: AC
  Filled 2015-12-27: qty 1500

## 2015-12-27 MED ORDER — LIDOCAINE HCL (PF) 1 % IJ SOLN
INTRAMUSCULAR | Status: AC
  Filled 2015-12-27: qty 30

## 2015-12-27 MED ORDER — VERAPAMIL HCL 2.5 MG/ML IV SOLN
INTRAVENOUS | Status: AC
  Filled 2015-12-27: qty 2

## 2015-12-27 MED ORDER — MIDAZOLAM HCL 2 MG/2ML IJ SOLN
INTRAMUSCULAR | Status: AC
  Filled 2015-12-27: qty 2

## 2015-12-27 MED ORDER — ASPIRIN 81 MG PO CHEW
81.0000 mg | CHEWABLE_TABLET | ORAL | Status: AC
  Administered 2015-12-27: 81 mg via ORAL

## 2015-12-27 MED ORDER — SODIUM CHLORIDE 0.9 % WEIGHT BASED INFUSION
3.0000 mL/kg/h | INTRAVENOUS | Status: DC
  Administered 2015-12-27: 3 mL/kg/h via INTRAVENOUS

## 2015-12-27 MED ORDER — IOPAMIDOL (ISOVUE-370) INJECTION 76%
INTRAVENOUS | Status: DC | PRN
  Administered 2015-12-27: 60 mL via INTRA_ARTERIAL

## 2015-12-27 MED ORDER — MIDAZOLAM HCL 2 MG/2ML IJ SOLN
INTRAMUSCULAR | Status: DC | PRN
  Administered 2015-12-27: 1 mg via INTRAVENOUS
  Administered 2015-12-27: 2 mg via INTRAVENOUS

## 2015-12-27 MED ORDER — ACETAMINOPHEN 325 MG PO TABS: 650.0000 mg | ORAL_TABLET | ORAL | Status: DC | PRN

## 2015-12-27 MED ORDER — HEPARIN SODIUM (PORCINE) 1000 UNIT/ML IJ SOLN
INTRAMUSCULAR | Status: AC
Start: 2015-12-27 — End: 2015-12-27
  Filled 2015-12-27: qty 1

## 2015-12-27 MED ORDER — DIAZEPAM 5 MG PO TABS
5.0000 mg | ORAL_TABLET | ORAL | Status: DC | PRN
Start: 2015-12-27 — End: 2015-12-27

## 2015-12-27 MED ORDER — SODIUM CHLORIDE 0.9 % IV SOLN: INTRAVENOUS | Status: AC

## 2015-12-27 SURGICAL SUPPLY — 15 items
CATH INFINITI 5FR ANG PIGTAIL (CATHETERS) ×2 IMPLANT
CATH INFINITI 5FR JL4 (CATHETERS) ×2 IMPLANT
CATH INFINITI JR4 5F (CATHETERS) ×2 IMPLANT
CATH OPTITORQUE TIG 4.0 5F (CATHETERS) ×2 IMPLANT
ELECT DEFIB PAD ADLT CADENCE (PAD) ×2 IMPLANT
GLIDESHEATH SLEND SS 6F .021 (SHEATH) ×2 IMPLANT
KIT HEART LEFT (KITS) ×2 IMPLANT
PACK CARDIAC CATHETERIZATION (CUSTOM PROCEDURE TRAY) ×2 IMPLANT
SHEATH PINNACLE 5F 10CM (SHEATH) ×2 IMPLANT
SYR MEDRAD MARK V 150ML (SYRINGE) ×2 IMPLANT
TRANSDUCER W/STOPCOCK (MISCELLANEOUS) ×2 IMPLANT
TUBING CIL FLEX 10 FLL-RA (TUBING) ×2 IMPLANT
WIRE EMERALD 3MM-J .035X150CM (WIRE) ×2 IMPLANT
WIRE HI TORQ VERSACORE-J 145CM (WIRE) ×2 IMPLANT
WIRE SAFE-T 1.5MM-J .035X260CM (WIRE) ×2 IMPLANT

## 2015-12-27 NOTE — H&P (View-Only) (Signed)
Cardiology Office Note   Date:  12/23/2015   ID:  [name], DOB 09-01-1975, MRN [id number]  PCP:  Odette Fraction, MD  Cardiologist:   Skeet Latch, MD   Chief Complaint  Patient presents with  . New Patient (Initial Visit)    sob; wehn exerting self. pressure in chest. lightheaded/dizziness. restless leg syndrome.       History of Present Illness: [name] is a 40 y.o. female with asthma, diabetes, hyperlipidemia, and tobacco abuse who presents for an evaluation of chest pain.  [name] saw Dr. Jenna Luo on 11/21/15.  At that appointment she reported two episodes of chest pain.  She had an EKG that revealed sinus tachycardia, rate 101 bpm.  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 She notes that while chasing after the children she has been having episodes of chest heaviness. The episodes are associated with shortness of breath, nausea, and diaphoresis. She also sometimes notes numbness in her left arm.  The episodes last for several minutes and improved with rest. After seeing Dr. Dennard Schaumann she has not been working. However, she notes that the chest pain recurs when she tries to do tasks such as dusting her home.  She also notes episodes of dizziness that are especially prominent when bending over or when she stands up after sitting for prolonged periods of time. She denies any syncope.  She also reports episodes of heart racing. This occurs separately from the episodes of chest pain. Her heart races for up to one hour per day. It is worse with exertion. She does not drink much caffeine or use illicit drugs. It is associated with shortness of breath but no chest pain, lightheadedness, or dizziness. She does feel very fatigued when it occurs.  Based on the symptoms Dr. Dennard Schaumann increased her carvedilol. She does not think that this is helped.   [name] was diagnosed with diabetes at age 29. For many years it was not  well-controlled. However, for the last 3 years it is been much better. Her most recent hemoglobin A1c was 6.6%.  She has smoked for many years and recently tried to stop with the use of Chantix. However, she was unsuccessful.   She currently smokes one half pack per day    Past Medical History:  Diagnosis Date  . Asthma   . Carpal tunnel syndrome on left   . Complication of anesthesia 07/2014   Mouth was swollen on inside, Front tooth was chipped  . Diabetes mellitus    19 years ago  . Diabetic neuropathy ([place])   . Diabetic retinopathy ([place])   . GERD (gastroesophageal reflux disease)   . Headache    Migraine- a long time ago  . Hypercholesteremia   . Hypertension   . Muscle spasms of lower extremity    legs and weakness  . Pneumonia   . Restless leg   . Type 1 diabetes mellitus ([place])   . Vitreous hemorrhage, left eye [facility])     Past Surgical History:  Procedure Laterality Date  . [place], 2002  . DILATION AND CURETTAGE OF UTERUS  1997  . GAS/FLUID EXCHANGE Right 08/28/2014   Procedure: GAS/FLUID EXCHANGE;  Surgeon: Hayden Pedro, MD;  Location: [place];  Service: Ophthalmology;  Laterality: Right;  . LASER PHOTO ABLATION     left x 2, right x 1  . LASER PHOTO ABLATION Right 08/28/2014  Procedure: LASER PHOTO ABLATION;  Surgeon: Hayden Pedro, MD;  Location: [place];  Service: Ophthalmology;  Laterality: Right;  . MEMBRANE PEEL Right 08/28/2014   Procedure: MEMBRANE PEEL;  Surgeon: Hayden Pedro, MD;  Location: [place];  Service: Ophthalmology;  Laterality: Right;  . PARS PLANA VITRECTOMY Right 08/28/2014   Procedure: PARS PLANA VITRECTOMY WITH 25 GAUGE;  Surgeon: Hayden Pedro, MD;  Location: [place];  Service: Ophthalmology;  Laterality: Right;  . PARS PLANA VITRECTOMY Left 12/24/2014   Procedure: LEFT PARS PLANA VITRECTOMY WITH 25 GAUGE WITH ENDOLASER ;  Surgeon: Hurman Horn, MD;  Location: [place];  Service: Ophthalmology;  Laterality: Left;  . TUBAL  LIGATION  2002  . WISDOM TOOTH EXTRACTION     age 27     Current Outpatient Prescriptions  Medication Sig Dispense Refill  . albuterol (PROAIR HFA) 108 (90 Base) MCG/ACT inhaler 2 PUFFS EVERY 6 HOURS AS NEEDED for shortness of breath 3 each 3  . albuterol (PROVENTIL) (2.5 MG/3ML) 0.083% nebulizer solution Take 3 mLs (2.5 mg total) by nebulization every 4 (four) hours as needed for wheezing or shortness of breath. 150 mL 1  . amLODipine (NORVASC) 10 MG tablet Take 1 tablet (10 mg total) by mouth daily. 90 tablet 3  . atorvastatin (LIPITOR) 20 MG tablet Take 1 tablet (20 mg total) by mouth daily. 90 tablet 3  . carvedilol (COREG) 12.5 MG tablet Take 2 tablets (25 mg total) by mouth 2 (two) times daily with a meal. 360 tablet 3  . glucose blood (ONETOUCH VERIO) test strip Check BS tid - qid 300 each 3  . Insulin Glargine (BASAGLAR KWIKPEN) 100 UNIT/ML SOPN Inject 0.7 mLs (70 Units total) into the skin at bedtime. 60 mL 3  . Lancets (ONETOUCH ULTRASOFT) lancets USE AS INSTRUCTED TO MONITOR BLOOD SUGAR 3 TO 4 TIMES DAILY 300 each 0  . losartan (COZAAR) 100 MG tablet Take 1 tablet (100 mg total) by mouth daily. 90 tablet 3  . OVER THE COUNTER MEDICATION Take 2 tablets by mouth at bedtime. Restless leg relief    . pantoprazole (PROTONIX) 40 MG tablet Take 1 tablet (40 mg total) by mouth daily. 90 tablet 3  . varenicline (CHANTIX STARTING MONTH PAK) 0.5 MG X 11 & 1 MG X 42 tablet Take one 0.5 mg tablet by mouth once daily for 3 days, then increase to one 0.5 mg tablet twice daily for 4 days, then increase to one 1 mg tablet twice daily. 53 tablet 0   No current facility-administered medications for this visit.     Allergies:   Fish-derived products; Demerol; Morphine and related; and Zocor [simvastatin]    Social History:  The patient  reports that she has been smoking Cigarettes.  She has a 6.00 pack-year smoking history. She has never used smokeless tobacco. She reports that she does not drink  alcohol or use drugs.   Family History:  The patient's family history includes Diabetes in her brother, father, and mother; Hypertension in her father; Parkinsonism in her mother.    ROS:  Please see the history of present illness.   Otherwise, review of systems are positive for fatigue.   All other systems are reviewed and negative.    PHYSICAL EXAM: VS:  BP 115/79   Pulse 94   Ht 5\' 3"  (1.6 m)   Wt 177 lb 9.6 oz (80.6 kg)   BMI 31.46 kg/m  , BMI Body mass index is 31.46 kg/m. GENERAL:  Well appearing HEENT:  Pupils equal round and reactive, fundi not visualized, oral mucosa unremarkable NECK:  No jugular venous distention, waveform within normal limits, carotid upstroke brisk and symmetric, no bruits, no thyromegaly LYMPHATICS:  No cervical adenopathy LUNGS:  Clear to auscultation bilaterally HEART:  RRR.  PMI not displaced or sustained,S1 and S2 within normal limits, no S3, no S4, no clicks, no rubs, no murmurs ABD:  Flat, positive bowel sounds normal in frequency in pitch, no bruits, no rebound, no guarding, no midline pulsatile mass, no hepatomegaly, no splenomegaly EXT:  2 plus pulses throughout, no edema, no cyanosis no clubbing SKIN:  No rashes no nodules NEURO:  Cranial nerves II through XII grossly intact, motor grossly intact throughout PSYCH:  Cognitively intact, oriented to person place and time  EKG:  EKG is ordered today. The ekg ordered today demonstrates sinus rhythm rate 94 bpm   Recent Labs: 11/11/2015: ALT 19; BUN 16; Creat 0.74; Hemoglobin 14.8; Platelets 206; Potassium 4.1; Sodium 142    Lipid Panel    Component Value Date/Time   CHOL 204 (H) 11/11/2015 0928   TRIG 109 11/11/2015 0928   HDL 33 (L) 11/11/2015 0928   CHOLHDL 6.2 (H) 11/11/2015 0928   VLDL 22 11/11/2015 0928   LDLCALC 149 (H) 11/11/2015 0928      Wt Readings from Last 3 Encounters:  12/23/15 177 lb 9.6 oz (80.6 kg)  11/21/15 181 lb (82.1 kg)  11/11/15 180 lb (81.6 kg)       ASSESSMENT AND PLAN:  # Angina: [name] has classic symptoms Of angina. She has several risk factors, including long-standing diabetes, hypertension, and hyperlipidemia. Her lipids have not been well-controlled. Therefore, we will refer her for left heart catheterization. She will also start aspirin 81 mg daily. Continue carvedilol.  # Hypertension: Blood pressure is well-controlled on amlodipine, carvedilol, and losartan.  # Hyperlipidemia:  [name] is not currently taking atorvastatin.  She had myalgias with both atorvastatin and simvastatin. Her lipids were not well-controlled on 11/11/15. We will wait until getting the data from her cardiac catheterization before determining what to do. She will likely need referral to our lipid clinic for consideration of a PCSK-9 inhibitor if she is not able to tolerate rosuvastatin.  # Palpitations: [name] reports recurrent palpitations occurring almost daily. We will obtain a 48 hour Holter. We will also check a CBC, a significant metabolic and oh, magnesium, and thyroid function. She has had episodes of resting tachycardia and is an active smoker. Therefore, we will check a D-dimer to evaluate for pulmonary embolism.   #Tobacco abuse: [name] was encouraged to stop smoking.  She already has a prescription for Chantix from Dr. Dennard Schaumann.  She isn't yet ready to quit.   Current medicines are reviewed at length with the patient today.  The patient does not have concerns regarding medicines.  The following changes have been made:  Start aspirin 81 mg daily   Labs/ tests ordered today include:  No orders of the defined types were placed in this encounter.    Disposition:   FU with Chanae Gemma C. Oval Linsey, MD, [facility] in 1 month    This note was written with the assistance of speech recognition software.  Please excuse any transcriptional errors.  Signed, Belynda Pagaduan C. Oval Linsey, MD, [facility]  12/23/2015 1:42 PM    [facility]

## 2015-12-27 NOTE — Research (Signed)
CADLAD Informed Consent   Subject Name: [name]  Subject met inclusion and exclusion criteria.  The informed consent form, study requirements and expectations were reviewed with the subject and questions and concerns were addressed prior to the signing of the consent form.  The subject verbalized understanding of the trail requirements.  The subject agreed to participate in the CADLAD trial and signed the informed consent.  The informed consent was obtained prior to performance of any protocol-specific procedures for the subject.  A copy of the signed informed consent was given to the subject and a copy was placed in the subject's medical record.  [name] 12/27/2015, 9:49 AM

## 2015-12-27 NOTE — Progress Notes (Signed)
Site area: rt groin fa sheath Site Prior to Removal:  Level 0 Pressure Applied For:  20 minutes Manual:   yes Patient Status During Pull:  stable Post Pull Site:  Level  0 Post Pull Instructions Given:  Yes   Post Pull Pulses Present: yes Dressing Applied:  tegaderm Bedrest begins @  1310 Comments:

## 2015-12-27 NOTE — Discharge Instructions (Signed)
Angiogram, Care After °Refer to this sheet in the next few weeks. These instructions provide you with information about caring for yourself after your procedure. Your health care provider may also give you more specific instructions. Your treatment has been planned according to current medical practices, but problems sometimes occur. Call your health care provider if you have any problems or questions after your procedure. °WHAT TO EXPECT AFTER THE PROCEDURE °After your procedure, it is typical to have the following: °· Bruising at the catheter insertion site that usually fades within 1-2 weeks. °· Blood collecting in the tissue (hematoma) that may be painful to the touch. It should usually decrease in size and tenderness within 1-2 weeks. °HOME CARE INSTRUCTIONS °· Take medicines only as directed by your health care provider. °· You may shower 24-48 hours after the procedure or as directed by your health care provider. Remove the bandage (dressing) and gently wash the site with plain soap and water. Pat the area dry with a clean towel. Do not rub the site, because this may cause bleeding. °· Do not take baths, swim, or use a hot tub until your health care provider approves. °· Check your insertion site every day for redness, swelling, or drainage. °· Do not apply powder or lotion to the site. °· Do not lift over 10 lb (4.5 kg) for 5 days after your procedure or as directed by your health care provider. °· Ask your health care provider when it is okay to: °¨ Return to work or school. °¨ Resume usual physical activities or sports. °¨ Resume sexual activity. °· Do not drive home if you are discharged the same day as the procedure. Have someone else drive you. °· You may drive 24 hours after the procedure unless otherwise instructed by your health care provider. °· Do not operate machinery or power tools for 24 hours after the procedure or as directed by your health care provider. °· If your procedure was done as an  outpatient procedure, which means that you went home the same day as your procedure, a responsible adult should be with you for the first 24 hours after you arrive home. °· Keep all follow-up visits as directed by your health care provider. This is important. °SEEK MEDICAL CARE IF: °· You have a fever. °· You have chills. °· You have increased bleeding from the catheter insertion site. Hold pressure on the site.  CALL 911 °SEEK IMMEDIATE MEDICAL CARE IF: °· You have unusual pain at the catheter insertion site. °· You have redness, warmth, or swelling at the catheter insertion site. °· You have drainage (other than a small amount of blood on the dressing) from the catheter insertion site. °· The catheter insertion site is bleeding, and the bleeding does not stop after 30 minutes of holding steady pressure on the site. °· The area near or just beyond the catheter insertion site becomes pale, cool, tingly, or numb. °  °This information is not intended to replace advice given to you by your health care provider. Make sure you discuss any questions you have with your health care provider. °  °Document Released: 10/02/2004 Document Revised: 04/06/2014 Document Reviewed: 08/17/2012 °Elsevier Interactive Patient Education ©2016 [employer] ° °

## 2015-12-27 NOTE — Interval H&P Note (Signed)
Cath Lab Visit (complete for each Cath Lab visit)  Clinical Evaluation Leading to the Procedure:   ACS: No.  Non-ACS:    Anginal Classification: CCS III  Anti-ischemic medical therapy: Maximal Therapy (2 or more classes of medications)  Non-Invasive Test Results: No non-invasive testing performed  Prior CABG: No previous CABG      History and Physical Interval Note:  12/27/2015 11:53 AM  [name]  has presented today for surgery, with the diagnosis of cp  The various methods of treatment have been discussed with the patient and family. After consideration of risks, benefits and other options for treatment, the patient has consented to  Procedure(s): Left Heart Cath and Coronary Angiography (N/A) as a surgical intervention .  The patient's history has been reviewed, patient examined, no change in status, stable for surgery.  I have reviewed the patient's chart and labs.  Questions were answered to the patient's satisfaction.     Shelva Majestic

## 2015-12-30 ENCOUNTER — Ambulatory Visit (INDEPENDENT_AMBULATORY_CARE_PROVIDER_SITE_OTHER): Payer: Commercial Managed Care - HMO

## 2015-12-30 DIAGNOSIS — I209 Angina pectoris, unspecified: Secondary | ICD-10-CM | POA: Diagnosis not present

## 2015-12-30 DIAGNOSIS — R000 Tachycardia, unspecified: Secondary | ICD-10-CM

## 2015-12-30 DIAGNOSIS — R002 Palpitations: Secondary | ICD-10-CM | POA: Diagnosis not present

## 2015-12-30 MED FILL — Verapamil HCl IV Soln 2.5 MG/ML: INTRAVENOUS | Qty: 2 | Status: AC

## 2015-12-30 MED FILL — Heparin Sodium (Porcine) 2 Unit/ML in Sodium Chloride 0.9%: INTRAMUSCULAR | Qty: 500 | Status: AC

## 2015-12-31 ENCOUNTER — Ambulatory Visit: Payer: Commercial Managed Care - HMO | Admitting: Cardiovascular Disease

## 2016-01-24 ENCOUNTER — Encounter: Payer: Self-pay | Admitting: Cardiovascular Disease

## 2016-01-24 ENCOUNTER — Encounter: Payer: Self-pay | Admitting: *Deleted

## 2016-01-24 ENCOUNTER — Ambulatory Visit (INDEPENDENT_AMBULATORY_CARE_PROVIDER_SITE_OTHER): Payer: Commercial Managed Care - HMO | Admitting: Cardiovascular Disease

## 2016-01-24 VITALS — BP 133/86 | HR 101 | Ht 63.0 in | Wt 178.8 lb

## 2016-01-24 DIAGNOSIS — E7800 Pure hypercholesterolemia, unspecified: Secondary | ICD-10-CM | POA: Diagnosis not present

## 2016-01-24 DIAGNOSIS — I10 Essential (primary) hypertension: Secondary | ICD-10-CM

## 2016-01-24 DIAGNOSIS — I493 Ventricular premature depolarization: Secondary | ICD-10-CM

## 2016-01-24 DIAGNOSIS — R079 Chest pain, unspecified: Secondary | ICD-10-CM | POA: Diagnosis not present

## 2016-01-24 DIAGNOSIS — R0602 Shortness of breath: Secondary | ICD-10-CM | POA: Diagnosis not present

## 2016-01-24 DIAGNOSIS — I491 Atrial premature depolarization: Secondary | ICD-10-CM

## 2016-01-24 MED ORDER — CARVEDILOL 25 MG PO TABS: 25.0000 mg | ORAL_TABLET | Freq: Two times a day (BID) | ORAL | 5 refills | Status: DC

## 2016-01-24 NOTE — Patient Instructions (Addendum)
Medication Instructions:  DECREASE YOU AMLODIPINE TO 5 MG DAILY  INCREASE YOUR CARVEDILOL TO 25 MG TWICE A DAY  Labwork: NONE  Testing/Procedures: Your physician has requested that you have an echocardiogram. Echocardiography is a painless test that uses sound waves to create images of your heart. It provides your doctor with information about the size and shape of your heart and how well your heart's chambers and valves are working. This procedure takes approximately one hour. There are no restrictions for this procedure. [address]  Follow-Up: Your physician recommends that you schedule a follow-up appointment in: 2 MONTH OV  Any Other Special Instructions Will Be Listed Below (If Applicable). You have been referred to PULMONARY for your shortness of breath. If you do not hear from the office you can call them directly at [phone]  If you need a refill on your cardiac medications before your next appointment, please call your pharmacy.

## 2016-01-24 NOTE — Progress Notes (Signed)
Cardiology Office Note   Date:  01/24/2016   ID:  [name], DOB September 03, 1975, MRN [id number]  PCP:  Odette Fraction, MD  Cardiologist:   Skeet Latch, MD   Chief Complaint  Patient presents with  . Follow-up    1 month; sob w/ exertion/ lightheaded; w/ sob. cramping in legs.    History of Present Illness: [name] is a 40 y.o. female with asthma, diabetes (since age 17), hyperlipidemia, and tobacco abuse who presents for follow up. [name] saw Dr. Jenna Luo on 11/21/15.  At that appointment she reported two episodes of chest pain.  She had an EKG that revealed sinus tachycardia, rate 101 bpm.  Her symptoms were felt to be classic angina and she has several risk factors so she was referred for coronary angiography. She had a left heart cath on 12/27/15 that revealed normal coronary arteries.  She also had a 48 hour Holter 12/30/15 that revealed occasional PVCs but was otherwise unremarkable.  Since her last appointment [name] has been feeling well.  She continues to have chest heaviness and shortness of breath with exertion.  She also notes palpitations most days.  It lasts for a few seconds and there is no associated lightheadedness or dizziness.  She continues to smoke 1 pack of cigarettes daily and is not yet ready to quit.  She denies lower extremity edema, orthopnea or PND.   Past Medical History:  Diagnosis Date  . Asthma   . Carpal tunnel syndrome on left   . Complication of anesthesia 07/2014   Mouth was swollen on inside, Front tooth was chipped  . Diabetes mellitus    19 years ago  . Diabetic neuropathy ([place])   . Diabetic retinopathy ([place])   . Essential hypertension 12/23/2015  . GERD (gastroesophageal reflux disease)   . Headache    Migraine- a long time ago  . Hypercholesteremia   . Hyperlipidemia 12/23/2015  . Hypertension   . Muscle spasms of lower extremity    legs and weakness  . Pneumonia   . Restless leg   . Type 1 diabetes mellitus  ([place])   . Vitreous hemorrhage, left eye [facility])     Past Surgical History:  Procedure Laterality Date  . CARDIAC CATHETERIZATION N/A 12/27/2015   Procedure: Left Heart Cath and Coronary Angiography;  Surgeon: Troy Sine, MD;  Location: [place] CV [facility];  Service: Cardiovascular;  Laterality: N/A;  . [place], 2002  . DILATION AND CURETTAGE OF UTERUS  1997  . GAS/FLUID EXCHANGE Right 08/28/2014   Procedure: GAS/FLUID EXCHANGE;  Surgeon: Hayden Pedro, MD;  Location: [place];  Service: Ophthalmology;  Laterality: Right;  . LASER PHOTO ABLATION     left x 2, right x 1  . LASER PHOTO ABLATION Right 08/28/2014   Procedure: LASER PHOTO ABLATION;  Surgeon: Hayden Pedro, MD;  Location: [place];  Service: Ophthalmology;  Laterality: Right;  . MEMBRANE PEEL Right 08/28/2014   Procedure: MEMBRANE PEEL;  Surgeon: Hayden Pedro, MD;  Location: [place];  Service: Ophthalmology;  Laterality: Right;  . PARS PLANA VITRECTOMY Right 08/28/2014   Procedure: PARS PLANA VITRECTOMY WITH 25 GAUGE;  Surgeon: Hayden Pedro, MD;  Location: [place];  Service: Ophthalmology;  Laterality: Right;  . PARS PLANA VITRECTOMY Left 12/24/2014   Procedure: LEFT PARS PLANA VITRECTOMY WITH 25 GAUGE WITH ENDOLASER ;  Surgeon: Hurman Horn, MD;  Location: [place];  Service: Ophthalmology;  Laterality:  Left;  . TUBAL LIGATION  2002  . WISDOM TOOTH EXTRACTION     age 56     Current Outpatient Prescriptions  Medication Sig Dispense Refill  . albuterol (PROAIR HFA) 108 (90 Base) MCG/ACT inhaler 2 PUFFS EVERY 6 HOURS AS NEEDED for shortness of breath (Patient taking differently: Inhale 2 puffs into the lungs every 6 (six) hours as needed for wheezing or shortness of breath. ) 3 each 3  . albuterol (PROVENTIL) (2.5 MG/3ML) 0.083% nebulizer solution Take 3 mLs (2.5 mg total) by nebulization every 4 (four) hours as needed for wheezing or shortness of breath. 150 mL 1  . amLODipine (NORVASC) 10 MG tablet Take 10 mg  by mouth as directed. 1/2 tablet by mouth daily     . aspirin EC 81 MG tablet Take 81 mg by mouth daily.    . carvedilol (COREG) 25 MG tablet Take 1 tablet (25 mg total) by mouth 2 (two) times daily with a meal. 60 tablet 5  . glucose blood (ONETOUCH VERIO) test strip Check BS tid - qid 300 each 3  . Insulin Glargine (BASAGLAR KWIKPEN) 100 UNIT/ML SOPN Inject 0.7 mLs (70 Units total) into the skin at bedtime. (Patient taking differently: Inject 25-35 Units into the skin See admin instructions. Inject 20 units subque in the morning and inject 35 units subque at night) 60 mL 3  . Lancets (ONETOUCH ULTRASOFT) lancets USE AS INSTRUCTED TO MONITOR BLOOD SUGAR 3 TO 4 TIMES DAILY 300 each 0  . losartan (COZAAR) 100 MG tablet Take 1 tablet (100 mg total) by mouth daily. 90 tablet 3  . OVER THE COUNTER MEDICATION Take 2 tablets by mouth at bedtime. Restless leg relief    . pantoprazole (PROTONIX) 40 MG tablet Take 1 tablet (40 mg total) by mouth daily. 90 tablet 3  . varenicline (CHANTIX STARTING MONTH PAK) 0.5 MG X 11 & 1 MG X 42 tablet Take one 0.5 mg tablet by mouth once daily for 3 days, then increase to one 0.5 mg tablet twice daily for 4 days, then increase to one 1 mg tablet twice daily. 53 tablet 0   No current facility-administered medications for this visit.     Allergies:   Fish-derived products; Demerol; Morphine and related; and Zocor [simvastatin]    Social History:  The patient  reports that she has been smoking Cigarettes.  She has a 6.00 pack-year smoking history. She has never used smokeless tobacco. She reports that she does not drink alcohol or use drugs.   Family History:  The patient's family history includes Diabetes in her brother, father, maternal grandmother, and mother; Heart failure in her maternal grandmother; Hypertension in her father; Parkinsonism in her mother.    ROS:  Please see the history of present illness.   Otherwise, review of systems are positive for fatigue.    All other systems are reviewed and negative.    PHYSICAL EXAM: VS:  BP 133/86   Pulse (!) 101   Ht 5\' 3"  (1.6 m)   Wt 81.1 kg (178 lb 12.8 oz)   LMP 07/14/2015 (Approximate) Comment: 2002 tubaligation  BMI 31.67 kg/m  , BMI Body mass index is 31.67 kg/m. GENERAL:  Well appearing HEENT:  Pupils equal round and reactive, fundi not visualized, oral mucosa unremarkable NECK:  No jugular venous distention, waveform within normal limits, carotid upstroke brisk and symmetric, no bruits LYMPHATICS:  No cervical adenopathy LUNGS:  Clear to auscultation bilaterally HEART:  RRR.  PMI not displaced  or sustained,S1 and S2 within normal limits, no S3, no S4, no clicks, no rubs, no murmurs ABD:  Flat, positive bowel sounds normal in frequency in pitch, no bruits, no rebound, no guarding, no midline pulsatile mass, no hepatomegaly, no splenomegaly EXT:  2 plus pulses throughout, no edema, no cyanosis no clubbing SKIN:  No rashes no nodules NEURO:  Cranial nerves II through XII grossly intact, motor grossly intact throughout PSYCH:  Cognitively intact, oriented to person place and time  EKG:  EKG is not ordered today. The ekg ordered 12/23/15 demonstrates sinus rhythm rate 94 bpm  48 Hour Holter Monitor 12/30/15:  Quality: Fair.  Baseline artifact. Predominant rhythm: sinus rhythm Average heart rate: 90 bpm Max heart rate: 136 bpm Min heart rate: 60 bpm  Occasional PVCs  LHC 12/27/15: Normal coronary arteries.  LVEF 55%.   Recent Labs: 11/11/2015: ALT 19 12/23/2015: BUN 11; Creat 0.80; Hemoglobin 14.2; Magnesium 2.0; Platelets 195; Potassium 4.5; Sodium 136; TSH 1.09    Lipid Panel    Component Value Date/Time   CHOL 204 (H) 11/11/2015 0928   TRIG 109 11/11/2015 0928   HDL 33 (L) 11/11/2015 0928   CHOLHDL 6.2 (H) 11/11/2015 0928   VLDL 22 11/11/2015 0928   LDLCALC 149 (H) 11/11/2015 0928      Wt Readings from Last 3 Encounters:  01/24/16 81.1 kg (178 lb 12.8 oz)  12/27/15 81.6  kg (180 lb)  12/23/15 80.6 kg (177 lb 9.6 oz)      ASSESSMENT AND PLAN:  # Chest pain: [name] to have episodes of chest discomfort. However, her left heart catheterization revealed normal coronary arteries and thus her chest pain is noncardiac.  # Shortness of breath:  LHC with negative for coronary disease. She does not appear to have heart failure on exam. We will obtain an echo to ensure that this is the case. We will also refer to pulmonary to see if there is a respiratory component to her dyspnea.  # Hypertension: Blood pressure is well-controlled on amlodipine, carvedilol, and losartan.  # Hyperlipidemia:  [name] is not currently taking atorvastatin.  She had myalgias with both atorvastatin and simvastatin.  Given that she did not have any evidence of coronary disease on her She would not qualify for a PCSK9 inhibitor.  Consider pravastatin or rosuvastatin at follow up.  # PAC/PVCs: Monitor revealed PACs and PVCs. We discussed the fact that this is not dangerous. However they are bothersome to her. We will increase carvedilol to 25 mg twice daily.  Given that her blood pressure is well controlled we will reduce amlodipine 5 mg a day.  #Tobacco abuse: [name] was encouraged to stop smoking.  She already has a prescription for Chantix from Dr. Dennard Schaumann.  She isn't yet ready to quit.    Current medicines are reviewed at length with the patient today.  The patient does not have concerns regarding medicines.  The following changes have been made:  reduce amlodipine to 5 mg and increase carvedilol to 25mg  bid.  Labs/ tests ordered today include:   Orders Placed This Encounter  Procedures  . Ambulatory referral to Pulmonology  . ECHOCARDIOGRAM COMPLETE     Disposition:   FU with Chandelle Harkey C. Oval Linsey, MD, [facility] in 2 months   This note was written with the assistance of speech recognition software.  Please excuse any transcriptional errors.  Signed, Nhyla Nappi C.  Oval Linsey, MD, [facility]  01/24/2016 5:47 PM    [facility]

## 2016-02-13 ENCOUNTER — Ambulatory Visit (HOSPITAL_COMMUNITY): Payer: Commercial Managed Care - HMO | Attending: Cardiology

## 2016-02-13 ENCOUNTER — Other Ambulatory Visit: Payer: Self-pay

## 2016-02-13 DIAGNOSIS — R0602 Shortness of breath: Secondary | ICD-10-CM | POA: Diagnosis not present

## 2016-02-13 DIAGNOSIS — R079 Chest pain, unspecified: Secondary | ICD-10-CM | POA: Diagnosis present

## 2016-02-19 ENCOUNTER — Encounter: Payer: Self-pay | Admitting: Family Medicine

## 2016-02-19 ENCOUNTER — Ambulatory Visit (INDEPENDENT_AMBULATORY_CARE_PROVIDER_SITE_OTHER): Payer: Commercial Managed Care - HMO | Admitting: Family Medicine

## 2016-02-19 VITALS — BP 126/72 | HR 74 | Temp 98.5°F | Resp 18 | Ht 63.0 in | Wt 177.0 lb

## 2016-02-19 DIAGNOSIS — E119 Type 2 diabetes mellitus without complications: Secondary | ICD-10-CM | POA: Diagnosis not present

## 2016-02-19 DIAGNOSIS — J441 Chronic obstructive pulmonary disease with (acute) exacerbation: Secondary | ICD-10-CM | POA: Diagnosis not present

## 2016-02-19 DIAGNOSIS — I10 Essential (primary) hypertension: Secondary | ICD-10-CM | POA: Diagnosis not present

## 2016-02-19 DIAGNOSIS — E7800 Pure hypercholesterolemia, unspecified: Secondary | ICD-10-CM

## 2016-02-19 DIAGNOSIS — R0600 Dyspnea, unspecified: Secondary | ICD-10-CM

## 2016-02-19 DIAGNOSIS — R0789 Other chest pain: Secondary | ICD-10-CM

## 2016-02-19 DIAGNOSIS — Z794 Long term (current) use of insulin: Secondary | ICD-10-CM

## 2016-02-19 LAB — CBC WITH DIFFERENTIAL/PLATELET
BASOS ABS: 0 {cells}/uL (ref 0–200)
Basophils Relative: 0 %
EOS ABS: 89 {cells}/uL (ref 15–500)
Eosinophils Relative: 1 %
HEMATOCRIT: 43.3 % (ref 35.0–45.0)
Hemoglobin: 14.6 g/dL (ref 12.0–15.0)
Lymphocytes Relative: 33 %
Lymphs Abs: 2937 cells/uL (ref 850–3900)
MCH: 30.7 pg (ref 27.0–33.0)
MCHC: 33.7 g/dL (ref 32.0–36.0)
MCV: 91 fL (ref 80.0–100.0)
MONO ABS: 623 {cells}/uL (ref 200–950)
MONOS PCT: 7 %
MPV: 10.6 fL (ref 7.5–12.5)
NEUTROS ABS: 5251 {cells}/uL (ref 1500–7800)
Neutrophils Relative %: 59 %
PLATELETS: 228 10*3/uL (ref 140–400)
RBC: 4.76 MIL/uL (ref 3.80–5.10)
RDW: 13.1 % (ref 11.0–15.0)
WBC: 8.9 10*3/uL (ref 3.8–10.8)

## 2016-02-19 LAB — COMPLETE METABOLIC PANEL WITH GFR
ALT: 17 U/L (ref 6–29)
AST: 17 U/L (ref 10–30)
Albumin: 4.4 g/dL (ref 3.6–5.1)
Alkaline Phosphatase: 83 U/L (ref 33–115)
BILIRUBIN TOTAL: 0.5 mg/dL (ref 0.2–1.2)
BUN: 13 mg/dL (ref 7–25)
CO2: 27 mmol/L (ref 20–31)
CREATININE: 0.82 mg/dL (ref 0.50–1.10)
Calcium: 9.5 mg/dL (ref 8.6–10.2)
Chloride: 103 mmol/L (ref 98–110)
GFR, EST NON AFRICAN AMERICAN: 89 mL/min (ref >=60)
GFR, Est African American: >89 mL/min (ref >=60)
GLUCOSE: 173 mg/dL — AB (ref 70–99)
Potassium: 4.2 mmol/L (ref 3.5–5.3)
SODIUM: 138 mmol/L (ref 135–146)
TOTAL PROTEIN: 7.1 g/dL (ref 6.1–8.1)

## 2016-02-19 LAB — HEMOGLOBIN A1C
HEMOGLOBIN A1C: 7.3 % — AB (ref <5.7)
MEAN PLASMA GLUCOSE: 163 mg/dL

## 2016-02-19 LAB — LIPID PANEL
CHOL/HDL RATIO: 8.4 ratio — AB (ref <5.0)
Cholesterol: 218 mg/dL — ABNORMAL HIGH (ref <200)
HDL: 26 mg/dL — ABNORMAL LOW (ref >50)
LDL CALC: 163 mg/dL — AB (ref <100)
Triglycerides: 144 mg/dL (ref <150)
VLDL: 29 mg/dL (ref <30)

## 2016-02-19 MED ORDER — BASAGLAR KWIKPEN 100 UNIT/ML ~~LOC~~ SOPN: 35.0000 [IU] | PEN_INJECTOR | Freq: Two times a day (BID) | SUBCUTANEOUS | 3 refills | Status: DC

## 2016-02-19 MED ORDER — PANTOPRAZOLE SODIUM 40 MG PO TBEC: 40.0000 mg | DELAYED_RELEASE_TABLET | Freq: Every day | ORAL | 3 refills | Status: DC

## 2016-02-19 MED ORDER — CARVEDILOL 25 MG PO TABS: 25.0000 mg | ORAL_TABLET | Freq: Two times a day (BID) | ORAL | 3 refills | Status: DC

## 2016-02-19 MED ORDER — ONETOUCH ULTRASOFT LANCETS MISC: 0 refills | Status: DC

## 2016-02-19 MED ORDER — GLUCOSE BLOOD VI STRP: ORAL_STRIP | 3 refills | Status: DC

## 2016-02-19 MED ORDER — AMLODIPINE BESYLATE 10 MG PO TABS: 10.0000 mg | ORAL_TABLET | ORAL | 3 refills | Status: DC

## 2016-02-19 MED ORDER — LOSARTAN POTASSIUM 100 MG PO TABS: 100.0000 mg | ORAL_TABLET | Freq: Every day | ORAL | 3 refills | Status: DC

## 2016-02-19 MED ORDER — PREDNISONE 20 MG PO TABS: ORAL_TABLET | ORAL | 0 refills | Status: DC

## 2016-02-19 MED ORDER — ALBUTEROL SULFATE HFA 108 (90 BASE) MCG/ACT IN AERS: INHALATION_SPRAY | RESPIRATORY_TRACT | 3 refills | Status: DC

## 2016-02-19 MED ORDER — AZITHROMYCIN 250 MG PO TABS: ORAL_TABLET | ORAL | 0 refills | Status: DC

## 2016-02-19 NOTE — Progress Notes (Signed)
Subjective:    Patient ID: [name], female    DOB: 1975-09-20, 40 y.o.   MRN: [id number]  Medication Refill    02/07/15 Patient is here today for follow-up of her diabetes. She states that her blood sugars are typically ranging between 70 and 150. She denies any hypoglycemia. She denies any polyuria, polydipsia. She is working very hard to control her sugars. Her blood pressures well controlled today at 126/80. However her sugar this morning is low at 47. This is because she is fasting to obtain her lab work today. This is causing her heart rate to be elevated and making her feel nauseated and tremulous. Therefore I gave the patient some candy while she was sitting in the office.  At that time, my plan was: Blood sugars sound well controlled. I will make no changes in her insulin dose at the present time. I will check a hemoglobin A1c. Goal hemoglobin A1c is less than 7.0 She is also due to check a fasting lipid panel. Goal LDL cholesterol is less than 100.  Her blood pressure today is well controlled. She does want help with smoking cessation. I will give the patient a Chantix starter pack. We discussed the increased risk of nausea abnormal dreams and depression on this medication and she is willing to accept the risk. If she needs to continue the medication next month I will prescribe a continuing Dosepak. I congratulated the patient on trying to quit smoking  06/04/15 Here today for follow up.  Her insurance is requiring her to switch from Lantus to basaglar.  Her fasting blood sugars are below 130. Her two-hour postprandial sugars are less than 130 as well. She is also experiencing hypoglycemic episodes 2 hours after she eats a meal. At present she is using Lantus 70 units in the morning and 5 units of NovoLog with meals. He continues to smoke.  AT that time, my plan was: Check fasting lab work. Goal hemoglobin A1c is less than 7.0. I would like the patient to discontinue rapid acting insulin  with meals due to the postprandial hyperglycemia but continue Lantus/glargine 70 units daily. Her blood pressures controlled. I will also check a fasting lipid panel with a goal LDL of less than 100. I strongly encouraged smoking cessation.  11/11/15 Patient is still smoking. She's had to take prednisone again for bronchitis with wheezing and bronchospasm. She has a history of asthma but I explained to the patient I feel she is also developing and emphysema. She really needs to quit smoking. She is interested in trying Chantix again. Her blood sugars have been very well controlled except when she is on prednisone. Typically her fasting blood sugars between 70 and 80. She is currently on long-acting insulin 35 units twice a day. She had to discontinue rapid acting insulin due to hypoglycemic episodes. Her blood pressures borderline today however this is because she has not taken her medication yet. She denies any chest pain shortness of breath or dyspnea on exertion. She denies any myalgias or right upper quadrant pain. Unfortunately she fractured her left fifth toe at the MTP joint approximately 8 weeks ago. She's been in a postop shoe since that time. The doctor that she saw his recommended she discontinue the postop shoe in the next week. I recommended the patient call me if she has pain when she comes out of the postop shoe so that we can repeat an x-ray to ensure adequate healing.  AT that time, my plan was:  Strongly recommended smoking cessation again. I gave the patient prescription for Chantix. Her blood pressure is acceptable. I will check a hemoglobin A1c as well as a fasting lipid panel. Her goal hemoglobin A1c will be less than 7. Her goal LDL cholesterol be less than 100. Call me back if pain returns in her foot when she comes out of the postop shoe. The present time she is pain-free.  11/21/15 Had 2 episodes of atypical chest pain. Both occurred at rest while changing diapers with minimal  exertion. She describes it as a pressure-like sensation in the right and left chest in a bandlike pattern. The pressure-like sensation lasted for a few minutes and then resolve spontaneously. While it occurred she felt short of breath however she denies any other episodes of chest pain with activity. She does yard work without chest pain. She denies any chest pain or shortness of breath with walking or sexual activity. EKG today shows sinus tachycardia but there is no evidence of ischemia or infarction. She continues to smoke. She has a long history of uncontrolled diabetes although she has done very well in controlling that as of late. Her blood pressure is elevated today. Her cholesterol is also elevated as was discovered on her recent lab work..  At that time, my plan was: Her chest pain is somewhat atypical however she has numerous risk factors including insulin-dependent diabetes mellitus, hypertension, hyperlipidemia, and tobacco abuse. Therefore I would like to arrange for her to see a cardiologist as soon as possible for stress test. In the meantime I will her to increase her carvedilol to 25 mg by mouth twice a day to address her elevated blood pressure as well as her tachycardia and I continue to encourage her to quit smoking. She is to go the hospital immediately should she develop chest pain at rest. Today she is pain-free and there is no evidence of ACS at the present time.  02/19/16 Here today for follow-up. Patient saw cardiology who performed a catheterization. It was no obstruction found in her coronary arteries. Echocardiogram revealed an ejection fraction of 60% with no wall motion abnormalities and no valvular dysfunction. Therefore there is no cardiac etiology of her shortness of breath or chest pain. She continues to complain of dyspnea on exertion and heaviness in her chest with exertion. However she continues to smoke. She is not using her Symbicort on a regular basis. She also has an  underlying history of asthma and I believe emphysema secondary to smoking. Patient is here today to follow-up her diabetes and recheck her fasting lipid panel as well as her A1c. However she also reports 2 weeks of illness. She reports worsening cough, shortness of breath, increasing chest congestion. On examination today, the patient has diffuse expiratory wheezing. She has rhonchorous breath sounds throughout all 4 lung fields. She has poor air movement. Past Medical History:  Diagnosis Date  . Asthma   . Carpal tunnel syndrome on left   . Complication of anesthesia 07/2014   Mouth was swollen on inside, Front tooth was chipped  . Diabetes mellitus    19 years ago  . Diabetic neuropathy ([place])   . Diabetic retinopathy ([place])   . Essential hypertension 12/23/2015  . GERD (gastroesophageal reflux disease)   . Headache    Migraine- a long time ago  . Hypercholesteremia   . Hyperlipidemia 12/23/2015  . Hypertension   . Muscle spasms of lower extremity    legs and weakness  . Pneumonia   .  Restless leg   . Type 1 diabetes mellitus ([place])   . Vitreous hemorrhage, left eye [facility])    Past Surgical History:  Procedure Laterality Date  . CARDIAC CATHETERIZATION N/A 12/27/2015   Procedure: Left Heart Cath and Coronary Angiography;  Surgeon: Troy Sine, MD;  Location: [place] [facility];  Service: Cardiovascular;  Laterality: N/A;  . [place], 2002  . DILATION AND CURETTAGE OF UTERUS  1997  . GAS/FLUID EXCHANGE Right 08/28/2014   Procedure: GAS/FLUID EXCHANGE;  Surgeon: Hayden Pedro, MD;  Location: [place];  Service: Ophthalmology;  Laterality: Right;  . LASER PHOTO ABLATION     left x 2, right x 1  . LASER PHOTO ABLATION Right 08/28/2014   Procedure: LASER PHOTO ABLATION;  Surgeon: Hayden Pedro, MD;  Location: [place];  Service: Ophthalmology;  Laterality: Right;  . MEMBRANE PEEL Right 08/28/2014   Procedure: MEMBRANE PEEL;  Surgeon: Hayden Pedro, MD;  Location: [place];   Service: Ophthalmology;  Laterality: Right;  . PARS PLANA VITRECTOMY Right 08/28/2014   Procedure: PARS PLANA VITRECTOMY WITH 25 GAUGE;  Surgeon: Hayden Pedro, MD;  Location: [place];  Service: Ophthalmology;  Laterality: Right;  . PARS PLANA VITRECTOMY Left 12/24/2014   Procedure: LEFT PARS PLANA VITRECTOMY WITH 25 GAUGE WITH ENDOLASER ;  Surgeon: Hurman Horn, MD;  Location: [place];  Service: Ophthalmology;  Laterality: Left;  . TUBAL LIGATION  2002  . WISDOM TOOTH EXTRACTION     age 37   Current Outpatient Prescriptions on File Prior to Visit  Medication Sig Dispense Refill  . albuterol (PROVENTIL) (2.5 MG/3ML) 0.083% nebulizer solution Take 3 mLs (2.5 mg total) by nebulization every 4 (four) hours as needed for wheezing or shortness of breath. 150 mL 1  . aspirin EC 81 MG tablet Take 81 mg by mouth daily.    Marland Kitchen OVER THE COUNTER MEDICATION Take 2 tablets by mouth at bedtime. Restless leg relief    . varenicline (CHANTIX STARTING MONTH PAK) 0.5 MG X 11 & 1 MG X 42 tablet Take one 0.5 mg tablet by mouth once daily for 3 days, then increase to one 0.5 mg tablet twice daily for 4 days, then increase to one 1 mg tablet twice daily. (Patient not taking: Reported on 02/19/2016) 53 tablet 0   No current facility-administered medications on file prior to visit.    Allergies  Allergen Reactions  . Fish-Derived Products Hives and Shortness Of Breath  . Demerol Itching and Rash  . Morphine And Related Itching and Rash       . Zocor [Simvastatin] Itching and Other (See Comments)    Leg pain   Social History   Social History  . Marital status: Married    Spouse name: N/A  . Number of children: N/A  . Years of education: N/A   Occupational History  . Not on file.   Social History Main Topics  . Smoking status: Current Every Day Smoker    Packs/day: 1.00    Years: 6.00    Types: Cigarettes  . Smokeless tobacco: Never Used  . Alcohol use No  . Drug use: No  . Sexual activity: Yes     Partners: Male    Birth control/ protection: None, Surgical     Comment: tubal    Other Topics Concern  . Not on file   Social History Narrative  . No narrative on file     Review of Systems  All other systems reviewed and are negative.      Objective:   Physical Exam  Constitutional: She appears well-developed and well-nourished.  Neck: Neck supple. No thyromegaly present.  Cardiovascular: Normal rate, regular rhythm, normal heart sounds and intact distal pulses.   No murmur heard. Pulmonary/Chest: Effort normal. No respiratory distress. She has decreased breath sounds. She has wheezes. She has rhonchi. She has no rales.  Abdominal: Soft. Bowel sounds are normal. She exhibits no distension. There is no tenderness. There is no rebound and no guarding.  Musculoskeletal: She exhibits no edema.  Lymphadenopathy:    She has no cervical adenopathy.  Vitals reviewed.         Assessment & Plan:  COPD exacerbation ([place]) - Plan: azithromycin (ZITHROMAX) 250 MG tablet, predniSONE (DELTASONE) 20 MG tablet  Diabetes mellitus, type II, insulin dependent ([place]) - Plan: COMPLETE METABOLIC PANEL WITH GFR, CBC with Differential/Platelet, Lipid panel, Hemoglobin A1c  Other chest pain  Essential hypertension  Pure hypercholesterolemia  Dyspnea, unspecified type  Her blood pressure today is well controlled. I will check a fasting lipid panel. Given her diabetes, her goal LDL cholesterol is less than 100. I will also check a hemoglobin A1c. For insulin-dependent diabetics, her goal A1c is less than 7. Regarding her shortness of breath and dyspnea on exertion and atypical chest pain I believe this is likely related to smoking, underlying COPD/asthma, medical noncompliance with Symbicort, and possibly anxiety. First and foremost, I recommended that the patient quit smoking. Second I commended compliance with Symbicort 2 puffs inhaled twice daily. While we need to treat her current COPD  exacerbation. Therefore I'll start the patient on a prednisone taper pack in addition to a Z-Pak. Recheck next week if no better or sooner if worse

## 2016-02-25 ENCOUNTER — Ambulatory Visit (INDEPENDENT_AMBULATORY_CARE_PROVIDER_SITE_OTHER)
Admission: RE | Admit: 2016-02-25 | Discharge: 2016-02-25 | Disposition: A | Discharge status: Home / Self Care | Payer: Commercial Managed Care - HMO | Source: Ambulatory Visit | Attending: Pulmonary Disease | Admitting: Pulmonary Disease

## 2016-02-25 ENCOUNTER — Other Ambulatory Visit: Payer: Commercial Managed Care - HMO

## 2016-02-25 ENCOUNTER — Ambulatory Visit (INDEPENDENT_AMBULATORY_CARE_PROVIDER_SITE_OTHER): Payer: Commercial Managed Care - HMO | Admitting: Pulmonary Disease

## 2016-02-25 ENCOUNTER — Encounter: Payer: Self-pay | Admitting: Pulmonary Disease

## 2016-02-25 VITALS — BP 124/76 | HR 85 | Ht 63.0 in | Wt 177.6 lb

## 2016-02-25 DIAGNOSIS — J4521 Mild intermittent asthma with (acute) exacerbation: Secondary | ICD-10-CM

## 2016-02-25 DIAGNOSIS — F17210 Nicotine dependence, cigarettes, uncomplicated: Secondary | ICD-10-CM

## 2016-02-25 DIAGNOSIS — J45909 Unspecified asthma, uncomplicated: Secondary | ICD-10-CM

## 2016-02-25 LAB — NITRIC OXIDE: Nitric Oxide: 24

## 2016-02-25 MED ORDER — FLUTICASONE FUROATE-VILANTEROL 100-25 MCG/INH IN AEPB: 1.0000 | INHALATION_SPRAY | Freq: Every day | RESPIRATORY_TRACT | 0 refills | Status: DC

## 2016-02-25 NOTE — Progress Notes (Signed)
Patient seen in the office today and instructed on use of Breo 100.  Patient expressed understanding and demonstrated technique.  Parke Poisson [facility] 02/25/2016

## 2016-02-25 NOTE — Patient Instructions (Signed)
We'll schedule you for a CBC with differential, blood allergy profile Stop taking the Symbicort. We will start you on Breo inhaler Return to clinic in 1 month

## 2016-02-25 NOTE — Progress Notes (Signed)
[name]    [id number]    May 14, 1975  Primary Care Physician:PICKARD,WARREN TOM, MD  Referring Physician: Susy Frizzle, MD [address] [place], [place] [address]  Chief complaint: Consult for evaluation of dyspnea.  HPI: [name] is a 40 year old with asthma, diabetes, hypertension, cigarette smoker. He was evaluated in September 2017 for chest pain. She's had a left heart cath on 12/27/15 which showed normal coronaries with an EF of 55%. She has been elevated for PCCM for further evaluation.  She has complains of dyspnea on exertion and occasionally at rest. She has occasional wheezing. She's had cough with mucus production for the past 1 week. She was given a Z-Pak by her primary care physician with some improvement in symptoms. She is also prescribed a prednisone taper but she has not needed to use it. She has a diagnosis of asthma for several years and is maintained on Symbicort and albuterol. She feels that the Symbicort is not working. She uses the albuterol 3-4 times per week with no nighttime awakening. She has significant issues with seasonal allergies, postnasal drip, acid reflux.  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 She is an active smoker. She has plans for starting Chantix within the next week to help her quit.  Outpatient Encounter Prescriptions as of 02/25/2016  Medication Sig  . albuterol (PROAIR HFA) 108 (90 Base) MCG/ACT inhaler 2 PUFFS EVERY 6 HOURS AS NEEDED for shortness of breath  . albuterol (PROVENTIL) (2.5 MG/3ML) 0.083% nebulizer solution Take 3 mLs (2.5 mg total) by nebulization every 4 (four) hours as needed for wheezing or shortness of breath.  Marland Kitchen amLODipine (NORVASC) 10 MG tablet Take 1 tablet (10 mg total) by mouth as directed.  Marland Kitchen aspirin EC 81 MG tablet Take 81 mg by mouth daily.  . carvedilol (COREG) 25 MG tablet Take 1 tablet (25 mg total) by mouth 2 (two) times daily with a meal.  .  glucose blood (ONETOUCH VERIO) test strip Check BS tid - qid  . Insulin Glargine (BASAGLAR KWIKPEN) 100 UNIT/ML SOPN Inject 0.35 mLs (35 Units total) into the skin 2 (two) times daily.  . Lancets (ONETOUCH ULTRASOFT) lancets USE AS INSTRUCTED TO MONITOR BLOOD SUGAR 3 TO 4 TIMES DAILY  . losartan (COZAAR) 100 MG tablet Take 1 tablet (100 mg total) by mouth daily.  Marland Kitchen OVER THE COUNTER MEDICATION Take 2 tablets by mouth at bedtime. Restless leg relief  . pantoprazole (PROTONIX) 40 MG tablet Take 1 tablet (40 mg total) by mouth daily.  . predniSONE (DELTASONE) 20 MG tablet 3 tabs poqday 1-2, 2 tabs poqday 3-4, 1 tab poqday 5-6 (Patient not taking: Reported on 02/25/2016)  . varenicline (CHANTIX STARTING MONTH PAK) 0.5 MG X 11 & 1 MG X 42 tablet Take one 0.5 mg tablet by mouth once daily for 3 days, then increase to one 0.5 mg tablet twice daily for 4 days, then increase to one 1 mg tablet twice daily. (Patient not taking: Reported on 02/25/2016)  . [DISCONTINUED] azithromycin (ZITHROMAX) 250 MG tablet 2 tabs poqday1, 1 tab poqday 2-5 (Patient not taking: Reported on 02/25/2016)   No facility-administered encounter medications on file as of 02/25/2016.     Allergies as of 02/25/2016 - Review Complete 02/25/2016  Allergen Reaction Noted  . Fish-derived products Hives and Shortness Of Breath 08/28/2014  . Demerol Itching and Rash 01/07/2011  . Morphine and related Itching and Rash  01/07/2011  . Zocor [simvastatin] Itching and Other (See Comments) 04/22/2011    Past Medical History:  Diagnosis Date  . Asthma   . Carpal tunnel syndrome on left   . Complication of anesthesia 07/2014   Mouth was swollen on inside, Front tooth was chipped  . Diabetes mellitus    19 years ago  . Diabetic neuropathy ([place])   . Diabetic retinopathy ([place])   . Essential hypertension 12/23/2015  . GERD (gastroesophageal reflux disease)   . Headache    Migraine- a long time ago  . Hypercholesteremia   . Hyperlipidemia  12/23/2015  . Hypertension   . Muscle spasms of lower extremity    legs and weakness  . Pneumonia   . Restless leg   . Type 1 diabetes mellitus ([place])   . Vitreous hemorrhage, left eye [facility])     Past Surgical History:  Procedure Laterality Date  . CARDIAC CATHETERIZATION N/A 12/27/2015   Procedure: Left Heart Cath and Coronary Angiography;  Surgeon: Troy Sine, MD;  Location: [place] [facility];  Service: Cardiovascular;  Laterality: N/A;  . [place], 2002  . DILATION AND CURETTAGE OF UTERUS  1997  . GAS/FLUID EXCHANGE Right 08/28/2014   Procedure: GAS/FLUID EXCHANGE;  Surgeon: Hayden Pedro, MD;  Location: [place];  Service: Ophthalmology;  Laterality: Right;  . LASER PHOTO ABLATION     left x 2, right x 1  . LASER PHOTO ABLATION Right 08/28/2014   Procedure: LASER PHOTO ABLATION;  Surgeon: Hayden Pedro, MD;  Location: [place];  Service: Ophthalmology;  Laterality: Right;  . MEMBRANE PEEL Right 08/28/2014   Procedure: MEMBRANE PEEL;  Surgeon: Hayden Pedro, MD;  Location: [place];  Service: Ophthalmology;  Laterality: Right;  . PARS PLANA VITRECTOMY Right 08/28/2014   Procedure: PARS PLANA VITRECTOMY WITH 25 GAUGE;  Surgeon: Hayden Pedro, MD;  Location: [place];  Service: Ophthalmology;  Laterality: Right;  . PARS PLANA VITRECTOMY Left 12/24/2014   Procedure: LEFT PARS PLANA VITRECTOMY WITH 25 GAUGE WITH ENDOLASER ;  Surgeon: Hurman Horn, MD;  Location: [place];  Service: Ophthalmology;  Laterality: Left;  . TUBAL LIGATION  2002  . WISDOM TOOTH EXTRACTION     age 65    Family History  Problem Relation Age of Onset  . Diabetes Mother   . Parkinsonism Mother   . Diabetes Father   . Hypertension Father   . Diabetes Brother   . Heart failure Maternal Grandmother   . Diabetes Maternal Grandmother     Social History   Social History  . Marital status: Married    Spouse name: N/A  . Number of children: N/A  . Years of education: N/A   Occupational  History  . Not on file.   Social History Main Topics  . Smoking status: Current Every Day Smoker    Packs/day: 1.00    Years: 9.00    Types: Cigarettes  . Smokeless tobacco: Never Used  . Alcohol use No  . Drug use: No  . Sexual activity: Yes    Partners: Male    Birth control/ protection: None, Surgical     Comment: tubal    Other Topics Concern  . Not on file   Social History Narrative   Married, lives with spouse and 2 kids   Occupation: Chemical engineer        Review of systems: Review of Systems  Constitutional: Negative for fever and chills.  HENT:  Negative.   Eyes: Negative for blurred vision.  Respiratory: as per HPI  Cardiovascular: Negative for chest pain and palpitations.  Gastrointestinal: Negative for vomiting, diarrhea, blood per rectum. Genitourinary: Negative for dysuria, urgency, frequency and hematuria.  Musculoskeletal: Negative for myalgias, back pain and joint pain.  Skin: Negative for itching and rash.  Neurological: Negative for dizziness, tremors, focal weakness, seizures and loss of consciousness.  Endo/Heme/Allergies: Negative for environmental allergies.  Psychiatric/Behavioral: Negative for depression, suicidal ideas and hallucinations.  All other systems reviewed and are negative.   Physical Exam: Blood pressure 124/76, pulse 85, height 5\' 3"  (1.6 m), weight 177 lb 9.6 oz (80.6 kg), SpO2 99 %. Gen:      No acute distress HEENT:  EOMI, sclera anicteric Neck:     No masses; no thyromegaly Lungs:    Clear to auscultation bilaterally; normal respiratory effort CV:         Regular rate and rhythm; no murmurs Abd:      + bowel sounds; soft, non-tender; no palpable masses, no distension Ext:    No edema; adequate peripheral perfusion Skin:      Warm and dry; no rash Neuro: alert and oriented x 3 Psych: normal mood and affect  Data Reviewed: LHC 12/27/15:-Normal coronary arteries.  LVEF 55%. Echo 02/13/16-  Left ventricle: The cavity size  was normal. Wall thickness was   normal. Systolic function was normal. The estimated ejection   fraction was in the range of 60% to 65%. Wall motion was normal;   there were no regional wall motion abnormalities. Left   ventricular diastolic function parameters were normal  CBC with diff 02/19/16- WBC 8.9, Eos 1%, absolute eos count- 89 Chest x-ray 06/16/10-right lower lobe infiltrate. Images reviewed  Assessment:  Asthmatic bronchitis with acute exacerbation.  [name] likely has asthma with history of seasonal allergies. Her postnasal drip, GERD and likely contributing to the problem. He feels that the Symbicort is not working. We will switch that to [facility] inhaler. We will also assess an FENO and a blood allergy profile. She'll need PFTs but I would like her to get over this acute exacerbation before scheduling that. She also get a chest x-ray today.  If her symptoms continue she may need to start working on her postnasal drip and GERD  Active smoker Smoking cessation discussed. She is interested in quitting is going on Chantix soon. Time spent counseling-5 minutes  Plan/Recommendations: - FENO, CXR - Blood allergy profile - Stop symbicort, start Breo.  Marshell Garfinkel MD [facility] Pager [phone] 02/25/2016, 9:57 AM  CC: Susy Frizzle, MD

## 2016-02-26 LAB — RESPIRATORY ALLERGY PROFILE REGION II ~~LOC~~
Allergen, C. Herbarum, M2: <0.1 kU/L
Allergen, Cottonwood, t14: <0.1 kU/L
Allergen, Mouse Urine Protein, e78: <0.1 kU/L
Allergen, Mulberry, t76: <0.1 kU/L
Allergen, P. notatum, m1: <0.1 kU/L
Aspergillus fumigatus, m3: <0.1 kU/L
Bermuda Grass: <0.1 kU/L
Cockroach: <0.1 kU/L
Common Ragweed: <0.1 kU/L
Dog Dander: <0.1 kU/L
IGE (IMMUNOGLOBULIN E), SERUM: 19 kU/L (ref <115)
Pecan/Hickory Tree IgE: <0.1 kU/L
Timothy Grass: <0.1 kU/L

## 2016-02-28 ENCOUNTER — Other Ambulatory Visit: Payer: Self-pay | Admitting: Pulmonary Disease

## 2016-02-28 DIAGNOSIS — J45909 Unspecified asthma, uncomplicated: Secondary | ICD-10-CM

## 2016-02-28 NOTE — Progress Notes (Signed)
Called spoke with patient, advised of cxr results / recs as stated by PM.  Pt verbalized her understanding and denied any questions.  Order placed for STAT cxr on date of her return visit on 12.15.17

## 2016-03-03 ENCOUNTER — Encounter: Payer: Self-pay | Admitting: Family Medicine

## 2016-03-03 ENCOUNTER — Other Ambulatory Visit: Payer: Self-pay | Admitting: Family Medicine

## 2016-03-03 ENCOUNTER — Ambulatory Visit (INDEPENDENT_AMBULATORY_CARE_PROVIDER_SITE_OTHER): Payer: Commercial Managed Care - HMO | Admitting: Family Medicine

## 2016-03-03 VITALS — BP 132/74 | HR 90 | Temp 99.0°F | Resp 18 | Ht 63.0 in | Wt 171.0 lb

## 2016-03-03 DIAGNOSIS — J441 Chronic obstructive pulmonary disease with (acute) exacerbation: Secondary | ICD-10-CM

## 2016-03-03 DIAGNOSIS — J4541 Moderate persistent asthma with (acute) exacerbation: Secondary | ICD-10-CM

## 2016-03-03 MED ORDER — FLUCONAZOLE 150 MG PO TABS: ORAL_TABLET | ORAL | 0 refills | Status: DC

## 2016-03-03 MED ORDER — PREDNISONE 20 MG PO TABS
ORAL_TABLET | ORAL | 0 refills | Status: DC
Start: 2016-03-03 — End: 2016-03-13

## 2016-03-03 MED ORDER — ATORVASTATIN CALCIUM 20 MG PO TABS: 20.0000 mg | ORAL_TABLET | Freq: Every day | ORAL | 3 refills | Status: DC

## 2016-03-03 MED ORDER — METHYLPREDNISOLONE ACETATE 80 MG/ML IJ SUSP
80.0000 mg | Freq: Once | INTRAMUSCULAR | Status: AC
  Administered 2016-03-03: 80 mg via INTRAMUSCULAR

## 2016-03-03 MED ORDER — GUAIFENESIN-CODEINE 100-10 MG/5ML PO SOLN: 5.0000 mL | Freq: Three times a day (TID) | ORAL | 0 refills | Status: DC | PRN

## 2016-03-03 MED ORDER — DOXYCYCLINE HYCLATE 100 MG PO TABS: 100.0000 mg | ORAL_TABLET | Freq: Two times a day (BID) | ORAL | 0 refills | Status: DC

## 2016-03-03 MED ORDER — IPRATROPIUM-ALBUTEROL 0.5-2.5 (3) MG/3ML IN SOLN
3.0000 mL | Freq: Once | RESPIRATORY_TRACT | Status: AC
  Administered 2016-03-03: 3 mL via RESPIRATORY_TRACT

## 2016-03-03 NOTE — Progress Notes (Signed)
   Subjective:    Patient ID: [name], female    DOB: 06/15/1975, 40 y.o.   MRN: [id number]  Patient presents for Illness (x2 weeks- cough, chest congestion)  Patient here with cough congestion wheezing she has underlying asthma with COPD. She continues to smoke. She was treated for exacerbation on November 22 with azithromycin and prednisone. She had been noncompliant with her Symbicort this was also restarted at that visit. She was seen by pulmonary on 11/28 the note states that she never took the prednisone taper.She states she started on 11/28 finished yesterday  She also stated that Symbicort was not helping therefore she was switched to [facility] by Pulmonary. Concerned that her symptoms were coming from a mix of allergies or asthma COPD as well as GERD. Using mucinex . She felt a little better after the zpak, but now symptoms have returned. She thinks neighbors burning leaves have exacerbated things. She has been Using nebulizer every 4 hours, using Breo,  No flu shot yet   Note she is diabetic her A1c was recently so 0.3% she is on Basaglar 25 units am and 35  Units at bedtime  , using novolog as needed while on prednisone   Review Of Systems:  GEN- denies fatigue, fever, weight loss,weakness, recent illness HEENT- denies eye drainage, change in vision, +nasal discharge, CVS- denies chest pain, palpitations RESP- +SOB, +cough,+ wheeze ABD- denies N/V, change in stools, abd pain GU- denies dysuria, hematuria, dribbling, incontinence MSK- denies joint pain, muscle aches, injury Neuro- denies headache, dizziness, syncope, seizure activity       Objective:    BP 132/74 (BP Location: Right Arm, Patient Position: Sitting, Cuff Size: Normal)   Pulse 90   Temp 99 F (37.2 C) (Oral)   Resp 18   Ht 5\' 3"  (1.6 m)   Wt 171 lb (77.6 kg)   SpO2 98%   BMI 30.29 kg/m  GEN- NAD, alert and oriented x3 HEENT- PERRL, EOMI, non injected sclera, pink conjunctiva, MMM, oropharynx clear,clear  rhinorrhea  Neck- Supple, no LAD  CVS- RRR, no murmur RESP-bilat bronchospasm, rhonchi at bases, fair air movement, no retractions EXT- No edema Pulses- Radial, DP- 2+        Assessment & Plan:      Problem List Items Addressed This Visit    None    Visit Diagnoses    Moderate persistent asthmatic bronchitis with acute exacerbation    -  Primary   Persistant exacerbations, with some external allergy triggers as well. She is not returned to baseline. Unfortunately continues to smoke. Will give Duoneb in office, she responded well to this. Given Depo Medrol injection, will give another prednisone taper, add doxycycline, since her illness has never truly cleared in the past 3 weeks. Robitussin codiene      Note: This dictation was prepared with Dragon dictation along with smaller phrase technology. Any transcriptional errors that result from this process are unintentional.

## 2016-03-03 NOTE — Patient Instructions (Addendum)
Take the prednisone as prescribed- START TOMORROW Take the doxycycline antibiotic Use the robitussin codiene cough medicine F/U as needed

## 2016-03-13 ENCOUNTER — Encounter: Payer: Self-pay | Admitting: Pulmonary Disease

## 2016-03-13 ENCOUNTER — Ambulatory Visit (INDEPENDENT_AMBULATORY_CARE_PROVIDER_SITE_OTHER): Payer: Commercial Managed Care - HMO | Admitting: Pulmonary Disease

## 2016-03-13 VITALS — BP 106/68 | HR 69 | Ht 63.0 in | Wt 173.2 lb

## 2016-03-13 DIAGNOSIS — F17210 Nicotine dependence, cigarettes, uncomplicated: Secondary | ICD-10-CM | POA: Diagnosis not present

## 2016-03-13 DIAGNOSIS — J45909 Unspecified asthma, uncomplicated: Secondary | ICD-10-CM

## 2016-03-13 NOTE — Progress Notes (Signed)
[name]    [id number]    1976/03/08  Primary Care Physician:Diane TOM, MD  Referring Physician: Susy Frizzle, MD [address] [place], [place] [address]  Chief complaint:  Follow up for evaluation of dyspnea.  HPI: Mrs. [name] is a 40 year old with asthma, diabetes, hypertension, cigarette smoker. He was evaluated in September 2017 for chest pain. She's had a left heart cath on 12/27/15 which showed normal coronaries with an EF of 55%. She has been elevated for PCCM for further evaluation.  She has complains of dyspnea on exertion and occasionally at rest. She has occasional wheezing. She's had cough with mucus production for the past 1 week. She was given a Z-Pak by her primary care physician with some improvement in symptoms. She is also prescribed a prednisone taper but she has not needed to use it. She has a diagnosis of asthma for several years and is maintained on Symbicort and albuterol. She feels that the Symbicort is not working. She uses the albuterol 3-4 times per week with no nighttime awakening. She has significant issues with seasonal allergies, postnasal drip, acid reflux.  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 She is an active smoker. She has plans for starting Chantix within the next week to help her quit.  Interim history: She has been started on Breo at last visit. She noticed a 60% improvement in her symptoms. However last week she had to go on a round of antibiotic and prednisone for an acute flareup.  Outpatient Encounter Prescriptions as of 03/13/2016  Medication Sig  . albuterol (PROAIR HFA) 108 (90 Base) MCG/ACT inhaler 2 PUFFS EVERY 6 HOURS AS NEEDED for shortness of breath  . albuterol (PROVENTIL) (2.5 MG/3ML) 0.083% nebulizer solution Take 3 mLs (2.5 mg total) by nebulization every 4 (four) hours as needed for wheezing or shortness of breath.  Marland Kitchen amLODipine (NORVASC) 10 MG tablet Take 1  tablet (10 mg total) by mouth as directed.  Marland Kitchen aspirin EC 81 MG tablet Take 81 mg by mouth daily.  Marland Kitchen atorvastatin (LIPITOR) 20 MG tablet Take 1 tablet (20 mg total) by mouth daily.  . carvedilol (COREG) 25 MG tablet Take 1 tablet (25 mg total) by mouth 2 (two) times daily with a meal.  . fluticasone furoate-vilanterol (BREO ELLIPTA) 100-25 MCG/INH AEPB Inhale 1 puff into the lungs daily.  Marland Kitchen glucose blood (ONETOUCH VERIO) test strip Check BS tid - qid  . guaiFENesin-codeine 100-10 MG/5ML syrup Take 5 mLs by mouth 3 (three) times daily as needed for cough.  . Insulin Glargine (BASAGLAR KWIKPEN) 100 UNIT/ML SOPN Inject 0.35 mLs (35 Units total) into the skin 2 (two) times daily.  . Lancets (ONETOUCH ULTRASOFT) lancets USE AS INSTRUCTED TO MONITOR BLOOD SUGAR 3 TO 4 TIMES DAILY  . losartan (COZAAR) 100 MG tablet Take 1 tablet (100 mg total) by mouth daily.  Marland Kitchen OVER THE COUNTER MEDICATION Take 2 tablets by mouth at bedtime. Restless leg relief  . pantoprazole (PROTONIX) 40 MG tablet Take 1 tablet (40 mg total) by mouth daily.  . varenicline (CHANTIX STARTING MONTH PAK) 0.5 MG X 11 & 1 MG X 42 tablet Take one 0.5 mg tablet by mouth once daily for 3 days, then increase to one 0.5 mg tablet twice daily for 4 days, then increase to one 1 mg tablet twice daily.  . [DISCONTINUED] doxycycline (VIBRA-TABS) 100 MG tablet Take 1 tablet (100 mg  total) by mouth 2 (two) times daily. (Patient not taking: Reported on 03/13/2016)  . [DISCONTINUED] fluconazole (DIFLUCAN) 150 MG tablet Take 1 tablet, repeat in 3 days (Patient not taking: Reported on 03/13/2016)  . [DISCONTINUED] predniSONE (DELTASONE) 20 MG tablet 3 tabs poqday 1-2, 2 tabs poqday 3-4, 1 tab poqday 5-6 (Patient not taking: Reported on 03/13/2016)   No facility-administered encounter medications on file as of 03/13/2016.     Allergies as of 03/13/2016 - Review Complete 03/13/2016  Allergen Reaction Noted  . Fish-derived products Hives and Shortness Of  Breath 08/28/2014  . Demerol Itching and Rash 01/07/2011  . Morphine and related Itching and Rash 01/07/2011  . Zocor [simvastatin] Itching and Other (See Comments) 04/22/2011    Past Medical History:  Diagnosis Date  . Asthma   . Carpal tunnel syndrome on left   . Complication of anesthesia 07/2014   Mouth was swollen on inside, Front tooth was chipped  . Diabetes mellitus    19 years ago  . Diabetic neuropathy ([place])   . Diabetic retinopathy ([place])   . Essential hypertension 12/23/2015  . GERD (gastroesophageal reflux disease)   . Headache    Migraine- a long time ago  . Hypercholesteremia   . Hyperlipidemia 12/23/2015  . Hypertension   . Muscle spasms of lower extremity    legs and weakness  . Pneumonia   . Restless leg   . Type 1 diabetes mellitus ([place])   . Vitreous hemorrhage, left eye [facility])     Past Surgical History:  Procedure Laterality Date  . CARDIAC CATHETERIZATION N/A 12/27/2015   Procedure: Left Heart Cath and Coronary Angiography;  Surgeon: Troy Sine, MD;  Location: [place] [facility];  Service: Cardiovascular;  Laterality: N/A;  . [place], 2002  . DILATION AND CURETTAGE OF UTERUS  1997  . GAS/FLUID EXCHANGE Right 08/28/2014   Procedure: GAS/FLUID EXCHANGE;  Surgeon: Hayden Pedro, MD;  Location: [place];  Service: Ophthalmology;  Laterality: Right;  . LASER PHOTO ABLATION     left x 2, right x 1  . LASER PHOTO ABLATION Right 08/28/2014   Procedure: LASER PHOTO ABLATION;  Surgeon: Hayden Pedro, MD;  Location: [place];  Service: Ophthalmology;  Laterality: Right;  . MEMBRANE PEEL Right 08/28/2014   Procedure: MEMBRANE PEEL;  Surgeon: Hayden Pedro, MD;  Location: [place];  Service: Ophthalmology;  Laterality: Right;  . PARS PLANA VITRECTOMY Right 08/28/2014   Procedure: PARS PLANA VITRECTOMY WITH 25 GAUGE;  Surgeon: Hayden Pedro, MD;  Location: [place];  Service: Ophthalmology;  Laterality: Right;  . PARS PLANA VITRECTOMY Left 12/24/2014    Procedure: LEFT PARS PLANA VITRECTOMY WITH 25 GAUGE WITH ENDOLASER ;  Surgeon: Hurman Horn, MD;  Location: [place];  Service: Ophthalmology;  Laterality: Left;  . TUBAL LIGATION  2002  . WISDOM TOOTH EXTRACTION     age 1    Family History  Problem Relation Age of Onset  . Diabetes Mother   . Parkinsonism Mother   . Diabetes Father   . Hypertension Father   . Diabetes Brother   . Heart failure Maternal Grandmother   . Diabetes Maternal Grandmother     Social History   Social History  . Marital status: Married    Spouse name: N/A  . Number of children: N/A  . Years of education: N/A   Occupational History  . Not on file.   Social History Main Topics  . Smoking  status: Current Every Day Smoker    Packs/day: 1.00    Years: 9.00    Types: Cigarettes  . Smokeless tobacco: Never Used  . Alcohol use No  . Drug use: No  . Sexual activity: Yes    Partners: Male    Birth control/ protection: None, Surgical     Comment: tubal    Other Topics Concern  . Not on file   Social History Narrative   Married, lives with spouse and 2 kids   Occupation: Chemical engineer      Review of systems: Review of Systems  Constitutional: Negative for fever and chills.  HENT: Negative.   Eyes: Negative for blurred vision.  Respiratory: as per HPI  Cardiovascular: Negative for chest pain and palpitations.  Gastrointestinal: Negative for vomiting, diarrhea, blood per rectum. Genitourinary: Negative for dysuria, urgency, frequency and hematuria.  Musculoskeletal: Negative for myalgias, back pain and joint pain.  Skin: Negative for itching and rash.  Neurological: Negative for dizziness, tremors, focal weakness, seizures and loss of consciousness.  Endo/Heme/Allergies: Negative for environmental allergies.  Psychiatric/Behavioral: Negative for depression, suicidal ideas and hallucinations.  All other systems reviewed and are negative.  Physical Exam: Blood pressure 124/76, pulse 85,  height 5\' 3"  (1.6 m), weight 177 lb 9.6 oz (80.6 kg), SpO2 99 %. Gen:      No acute distress HEENT:  EOMI, sclera anicteric Neck:     No masses; no thyromegaly Lungs:    Clear to auscultation bilaterally; normal respiratory effort CV:         Regular rate and rhythm; no murmurs Abd:      + bowel sounds; soft, non-tender; no palpable masses, no distension Ext:    No edema; adequate peripheral perfusion Skin:      Warm and dry; no rash Neuro: alert and oriented x 3 Psych: normal mood and affect  Data Reviewed: LHC 12/27/15:-Normal coronary arteries.  LVEF 55%. Echo 02/13/16-  Left ventricle: The cavity size was normal. Wall thickness was   normal. Systolic function was normal. The estimated ejection   fraction was in the range of 60% to 65%. Wall motion was normal;   there were no regional wall motion abnormalities. Left   ventricular diastolic function parameters were normal  CBC with diff 02/19/16- WBC 8.9, Eos 1%, absolute eos count- 89 Chest x-ray 06/16/10-right lower lobe infiltrate. Chest x-ray 02/25/16-persistent right lower lobe infiltrate. All images reviewed  FENO 02/25/16- 24 Blood allergy profile 02/25/16 Negative, IgE 19  Assessment:  #1 Asthmatic bronchitis. Mrs. Madron has asthma with history of seasonal allergies. Her postnasal drip, GERD and likely contributing to the problem.  She has been started on Breo at last visit and is doing better with that. She had an exacerbation last week which was treated with antibiotics and steroids. If her symptoms worsen she may need to start working on her postnasal drip and GER  #2 Abnormal CXR She's had a chest x-ray which shows persistent right lower lobe infiltrate. I will get a CT scan for further evaluation.   #3 Active smoker She continues to smoke but is trying to quit. She does not want any help with nicotine replacement. Time spent in counseling-5 minutes.  Plan/Recommendations: - Continue Breo - CT chest  - Smoking  cessation  Marshell Garfinkel MD [facility] Pager [phone] 03/13/2016, 11:01 AM  CC: Diane Frizzle, MD

## 2016-03-13 NOTE — Patient Instructions (Signed)
We will schedule you for PFTs and CT of chest without contrast Continue to work on smoking cessation  Follow up in 3 moinths

## 2016-03-17 ENCOUNTER — Encounter: Payer: Self-pay | Admitting: Pulmonary Disease

## 2016-03-18 ENCOUNTER — Ambulatory Visit (HOSPITAL_COMMUNITY)
Admission: RE | Admit: 2016-03-18 | Discharge: 2016-03-18 | Disposition: A | Discharge status: Home / Self Care | Payer: Commercial Managed Care - HMO | Source: Ambulatory Visit | Attending: Pulmonary Disease | Admitting: Pulmonary Disease

## 2016-03-18 ENCOUNTER — Ambulatory Visit (INDEPENDENT_AMBULATORY_CARE_PROVIDER_SITE_OTHER)
Admission: RE | Admit: 2016-03-18 | Discharge: 2016-03-18 | Disposition: A | Discharge status: Home / Self Care | Payer: Commercial Managed Care - HMO | Source: Ambulatory Visit | Attending: Pulmonary Disease | Admitting: Pulmonary Disease

## 2016-03-18 DIAGNOSIS — J45909 Unspecified asthma, uncomplicated: Secondary | ICD-10-CM | POA: Insufficient documentation

## 2016-03-18 LAB — PULMONARY FUNCTION TEST
DL/VA % pred: 86 %
DL/VA: 4.07 ml/min/mmHg/L
DLCO UNC % PRED: 67 %
DLCO UNC: 15.45 ml/min/mmHg
FEF 25-75 PRE: 3.2 L/s
FEF 25-75 Post: 3.62 L/sec
FEF2575-%Change-Post: 12 %
FEF2575-%Pred-Post: 120 %
FEF2575-%Pred-Pre: 107 %
FEV1-%CHANGE-POST: 0 %
FEV1-%PRED-POST: 86 %
FEV1-%Pred-Pre: 86 %
FEV1-POST: 2.49 L
FEV1-Pre: 2.51 L
FEV1FVC-%Change-Post: 1 %
FEV1FVC-%Pred-Pre: 107 %
FEV6-%Change-Post: -1 %
FEV6-%PRED-POST: 80 %
FEV6-%Pred-Pre: 81 %
FEV6-POST: 2.79 L
FEV6-PRE: 2.84 L
FEV6FVC-%PRED-POST: 102 %
FEV6FVC-%PRED-PRE: 102 %
FVC-%Change-Post: -1 %
FVC-%Pred-Post: 78 %
FVC-%Pred-Pre: 80 %
FVC-Post: 2.79 L
FVC-Pre: 2.84 L
PRE FEV6/FVC RATIO: 100 %
Post FEV1/FVC ratio: 89 %
Post FEV6/FVC ratio: 100 %
Pre FEV1/FVC ratio: 88 %
RV % PRED: 60 %
RV: 0.96 L
TLC % pred: 79 %
TLC: 3.88 L

## 2016-03-18 MED ORDER — ALBUTEROL SULFATE (2.5 MG/3ML) 0.083% IN NEBU
2.5000 mg | INHALATION_SOLUTION | Freq: Once | RESPIRATORY_TRACT | Status: AC
Start: 2016-03-18 — End: 2016-03-18
  Administered 2016-03-18: 2.5 mg via RESPIRATORY_TRACT

## 2016-03-19 NOTE — Progress Notes (Signed)
LMOMTCB x 1 

## 2016-03-25 ENCOUNTER — Encounter (INDEPENDENT_AMBULATORY_CARE_PROVIDER_SITE_OTHER): Payer: Self-pay

## 2016-03-25 ENCOUNTER — Encounter: Payer: Self-pay | Admitting: Cardiovascular Disease

## 2016-03-25 ENCOUNTER — Ambulatory Visit (INDEPENDENT_AMBULATORY_CARE_PROVIDER_SITE_OTHER): Payer: Commercial Managed Care - HMO | Admitting: Cardiovascular Disease

## 2016-03-25 VITALS — BP 121/80 | HR 83 | Ht 63.0 in | Wt 173.2 lb

## 2016-03-25 DIAGNOSIS — R0789 Other chest pain: Secondary | ICD-10-CM | POA: Diagnosis not present

## 2016-03-25 DIAGNOSIS — I491 Atrial premature depolarization: Secondary | ICD-10-CM

## 2016-03-25 DIAGNOSIS — I10 Essential (primary) hypertension: Secondary | ICD-10-CM

## 2016-03-25 DIAGNOSIS — Z720 Tobacco use: Secondary | ICD-10-CM

## 2016-03-25 DIAGNOSIS — E7800 Pure hypercholesterolemia, unspecified: Secondary | ICD-10-CM | POA: Diagnosis not present

## 2016-03-25 DIAGNOSIS — I2584 Coronary atherosclerosis due to calcified coronary lesion: Secondary | ICD-10-CM

## 2016-03-25 DIAGNOSIS — R0602 Shortness of breath: Secondary | ICD-10-CM | POA: Diagnosis not present

## 2016-03-25 DIAGNOSIS — I493 Ventricular premature depolarization: Secondary | ICD-10-CM

## 2016-03-25 DIAGNOSIS — I2510 Atherosclerotic heart disease of native coronary artery without angina pectoris: Secondary | ICD-10-CM

## 2016-03-25 MED ORDER — FLUTICASONE FUROATE-VILANTEROL 100-25 MCG/INH IN AEPB
1.0000 | INHALATION_SPRAY | Freq: Every day | RESPIRATORY_TRACT | 0 refills | Status: DC
Start: 2016-03-25 — End: 2016-11-05

## 2016-03-25 NOTE — Progress Notes (Signed)
Cardiology Office Note   Date:  03/25/2016   ID:  [name], DOB 03-Jun-1975, MRN [id number]  PCP:  Odette Fraction, MD  Cardiologist:   Skeet Latch, MD   Chief Complaint  Patient presents with  . Follow-up    2 momths;  . Chest Pain    Pressure.    History of Present Illness: [name] is a 40 y.o. female with asthma, diabetes (since age 29), hyperlipidemia, and tobacco abuse who presents for follow up. [name] saw Dr. Jenna Luo on 11/21/15.  At that appointment she reported two episodes of chest pain.  She had an EKG that revealed sinus tachycardia, rate 101 bpm.  Her symptoms were felt to be classic angina and she has several risk factors so she was referred for coronary angiography. She had a left heart cath on 12/27/15 that revealed normal coronary arteries.  She also had a 48 hour Holter 12/30/15 that revealed occasional PVCs but was otherwise unremarkable.  Since her last appointment [name] has been feeling well.  She was seen by Dr. Marshell Garfinkel of pulmonology and started on Breo.  She thinks that this has helped her breathing significantly.  She denies chest pain.  At her last appointment carvedilol was increased and this has improved both her blood pressure and her heart rate.   Past Medical History:  Diagnosis Date  . Asthma   . Carpal tunnel syndrome on left   . Complication of anesthesia 07/2014   Mouth was swollen on inside, Front tooth was chipped  . Diabetes mellitus    19 years ago  . Diabetic neuropathy ([place])   . Diabetic retinopathy ([place])   . Essential hypertension 12/23/2015  . GERD (gastroesophageal reflux disease)   . Headache    Migraine- a long time ago  . Hypercholesteremia   . Hyperlipidemia 12/23/2015  . Hypertension   . Muscle spasms of lower extremity    legs and weakness  . Pneumonia   . Restless leg   . Type 1 diabetes mellitus ([place])   . Vitreous hemorrhage, left eye [facility])     Past Surgical History:  Procedure  Laterality Date  . CARDIAC CATHETERIZATION N/A 12/27/2015   Procedure: Left Heart Cath and Coronary Angiography;  Surgeon: Troy Sine, MD;  Location: [place] [facility];  Service: Cardiovascular;  Laterality: N/A;  . [place], 2002  . DILATION AND CURETTAGE OF UTERUS  1997  . GAS/FLUID EXCHANGE Right 08/28/2014   Procedure: GAS/FLUID EXCHANGE;  Surgeon: Hayden Pedro, MD;  Location: [place];  Service: Ophthalmology;  Laterality: Right;  . LASER PHOTO ABLATION     left x 2, right x 1  . LASER PHOTO ABLATION Right 08/28/2014   Procedure: LASER PHOTO ABLATION;  Surgeon: Hayden Pedro, MD;  Location: [place];  Service: Ophthalmology;  Laterality: Right;  . MEMBRANE PEEL Right 08/28/2014   Procedure: MEMBRANE PEEL;  Surgeon: Hayden Pedro, MD;  Location: [place];  Service: Ophthalmology;  Laterality: Right;  . PARS PLANA VITRECTOMY Right 08/28/2014   Procedure: PARS PLANA VITRECTOMY WITH 25 GAUGE;  Surgeon: Hayden Pedro, MD;  Location: [place];  Service: Ophthalmology;  Laterality: Right;  . PARS PLANA VITRECTOMY Left 12/24/2014   Procedure: LEFT PARS PLANA VITRECTOMY WITH 25 GAUGE WITH ENDOLASER ;  Surgeon: Hurman Horn, MD;  Location: [place];  Service: Ophthalmology;  Laterality: Left;  . TUBAL LIGATION  2002  . WISDOM TOOTH EXTRACTION  age 19     Current Outpatient Prescriptions  Medication Sig Dispense Refill  . albuterol (PROAIR HFA) 108 (90 Base) MCG/ACT inhaler 2 PUFFS EVERY 6 HOURS AS NEEDED for shortness of breath 3 each 3  . albuterol (PROVENTIL) (2.5 MG/3ML) 0.083% nebulizer solution Take 3 mLs (2.5 mg total) by nebulization every 4 (four) hours as needed for wheezing or shortness of breath. 150 mL 1  . amLODipine (NORVASC) 10 MG tablet Take 1 tablet (10 mg total) by mouth as directed. 90 tablet 3  . aspirin EC 81 MG tablet Take 81 mg by mouth daily.    . carvedilol (COREG) 25 MG tablet Take 1 tablet (25 mg total) by mouth 2 (two) times daily with a meal. 180  tablet 3  . fluticasone furoate-vilanterol (BREO ELLIPTA) 100-25 MCG/INH AEPB Inhale 1 puff into the lungs daily. 2 each 0  . glucose blood (ONETOUCH VERIO) test strip Check BS tid - qid 300 each 3  . guaiFENesin-codeine 100-10 MG/5ML syrup Take 5 mLs by mouth 3 (three) times daily as needed for cough. 140 mL 0  . Insulin Glargine (BASAGLAR KWIKPEN) 100 UNIT/ML SOPN Inject 0.35 mLs (35 Units total) into the skin 2 (two) times daily. 60 mL 3  . Lancets (ONETOUCH ULTRASOFT) lancets USE AS INSTRUCTED TO MONITOR BLOOD SUGAR 3 TO 4 TIMES DAILY 300 each 0  . losartan (COZAAR) 100 MG tablet Take 1 tablet (100 mg total) by mouth daily. 90 tablet 3  . OVER THE COUNTER MEDICATION Take 2 tablets by mouth at bedtime. Restless leg relief    . pantoprazole (PROTONIX) 40 MG tablet Take 1 tablet (40 mg total) by mouth daily. 90 tablet 3  . varenicline (CHANTIX STARTING MONTH PAK) 0.5 MG X 11 & 1 MG X 42 tablet Take one 0.5 mg tablet by mouth once daily for 3 days, then increase to one 0.5 mg tablet twice daily for 4 days, then increase to one 1 mg tablet twice daily. 53 tablet 0   No current facility-administered medications for this visit.     Allergies:   Fish-derived products; Demerol; Morphine and related; and Zocor [simvastatin]    Social History:  The patient  reports that she has been smoking Cigarettes.  She has a 9.00 pack-year smoking history. She has never used smokeless tobacco. She reports that she does not drink alcohol or use drugs.   Family History:  The patient's family history includes Diabetes in her brother, father, maternal grandmother, and mother; Heart failure in her maternal grandmother; Hypertension in her father; Parkinsonism in her mother.    ROS:  Please see the history of present illness.   Otherwise, review of systems are positive for fatigue.   All other systems are reviewed and negative.    PHYSICAL EXAM: VS:  BP 121/80   Pulse 83   Ht 5\' 3"  (1.6 m)   Wt 78.6 kg (173 lb  3.2 oz)   BMI 30.68 kg/m  , BMI Body mass index is 30.68 kg/m. GENERAL:  Well appearing HEENT:  Pupils equal round and reactive, fundi not visualized, oral mucosa unremarkable NECK:  No jugular venous distention, waveform within normal limits, carotid upstroke brisk and symmetric, no bruits LYMPHATICS:  No cervical adenopathy LUNGS:  Clear to auscultation bilaterally HEART:  RRR.  PMI not displaced or sustained,S1 and S2 within normal limits, no S3, no S4, no clicks, no rubs, no murmurs ABD:  Flat, positive bowel sounds normal in frequency in pitch, no bruits, no  rebound, no guarding, no midline pulsatile mass, no hepatomegaly, no splenomegaly EXT:  2 plus pulses throughout, no edema, no cyanosis no clubbing SKIN:  No rashes no nodules NEURO:  Cranial nerves II through XII grossly intact, motor grossly intact throughout PSYCH:  Cognitively intact, oriented to person place and time  EKG:  EKG is not ordered today. The ekg ordered 12/23/15 demonstrates sinus rhythm rate 94 bpm  Echo 02/13/16: Study Conclusions  - Left ventricle: The cavity size was normal. Wall thickness was   normal. Systolic function was normal. The estimated ejection   fraction was in the range of 60% to 65%. Wall motion was normal;   there were no regional wall motion abnormalities. Left   ventricular diastolic function parameters were normal.  48 Hour Holter Monitor 12/30/15:  Quality: Fair.  Baseline artifact. Predominant rhythm: sinus rhythm Average heart rate: 90 bpm Max heart rate: 136 bpm Min heart rate: 60 bpm  Occasional PVCs  LHC 12/27/15: Normal coronary arteries.  LVEF 55%.   Recent Labs: 12/23/2015: Magnesium 2.0; TSH 1.09 02/19/2016: ALT 17; BUN 13; Creat 0.82; Hemoglobin 14.6; Platelets 228; Potassium 4.2; Sodium 138    Lipid Panel    Component Value Date/Time   CHOL 218 (H) 02/19/2016 0816   TRIG 144 02/19/2016 0816   HDL 26 (L) 02/19/2016 0816   CHOLHDL 8.4 (H) 02/19/2016 0816    VLDL 29 02/19/2016 0816   LDLCALC 163 (H) 02/19/2016 0816      Wt Readings from Last 3 Encounters:  03/25/16 78.6 kg (173 lb 3.2 oz)  03/13/16 78.6 kg (173 lb 3.2 oz)  03/03/16 77.6 kg (171 lb)      ASSESSMENT AND PLAN:  # Chest pain: Resolved.  No significant CAD on LHC.  # Shortness of breath:  Symptoms have improved with her COPD inhaler.  # Hypertension: Blood pressure is well-controlled on amlodipine, carvedilol, and losartan.  # Hyperlipidemia:  Ms. [place] has not tolerated three statins due to myalgias.  She had a CT scan that showed calcifications of the aorta and coronaries.  We will refer her to our lipid clinic for a PCSK9 inhibitor.  # PAC/PVCs: Monitor revealed PACs and PVCs. Improved since we increased carvedilol to 25mg  bid.  #Tobacco abuse: Ms. [place] was encouraged to stop smoking.  She already has a prescription for Chantix from Dr. Dennard Schaumann.  She isn't yet ready to quit. She will consider starting in January.   Current medicines are reviewed at length with the patient today.  The patient does not have concerns regarding medicines.  The following changes have been made:  Start PCSK9 inhibitor.  Labs/ tests ordered today include:   No orders of the defined types were placed in this encounter.    Disposition:   FU with Maximos Zayas C. Oval Linsey, MD, [facility] in 2 months   This note was written with the assistance of speech recognition software.  Please excuse any transcriptional errors.  Signed, Delane Wessinger C. Oval Linsey, MD, [facility]  03/25/2016 12:09 PM    [place]

## 2016-03-25 NOTE — Patient Instructions (Signed)
BREO refilled ,please have lung doctor refill in the future.  You have been referred to [facility] CLINIC at [facility] office.   Your physician wants you to follow-up in 12 months with Dr Oval Linsey. You will receive a reminder letter in the mail two months in advance. If you don't receive a letter, please call our office to schedule the follow-up appointment.   If you need a refill on your cardiac medications before your next appointment, please call your pharmacy.

## 2016-04-08 ENCOUNTER — Encounter: Payer: Self-pay | Admitting: Pharmacist

## 2016-04-08 ENCOUNTER — Ambulatory Visit (INDEPENDENT_AMBULATORY_CARE_PROVIDER_SITE_OTHER): Payer: Commercial Managed Care - HMO | Admitting: Pharmacist

## 2016-04-08 DIAGNOSIS — E7800 Pure hypercholesterolemia, unspecified: Secondary | ICD-10-CM

## 2016-04-08 MED ORDER — ROSUVASTATIN CALCIUM 5 MG PO TABS: ORAL_TABLET | ORAL | 2 refills | Status: DC

## 2016-04-08 NOTE — Progress Notes (Signed)
Patient ID: [name]                 DOB: [birth year]                    MRN: [id number]     HPI: [name] is a 41 y.o. female patient referred to lipid clinic by Dr Oval Linsey. PMH includes asthma, diabetes (since age 62), hyperlipidemia, restless leg syndrome, atypical chest pain, and tobacco abuse.  CT showed calcification of the aorta and coronaries. Patient presents to lipid clinic with husband for initial evaluation and assessment for potential PCSK9 use.   Current Medications: none  Intolerances: atorvastatin 20mg  (severe muscle pain and leg pain); pravastatin 20-80mg  (muscle aches), simvastatin 20mg  (itching and pain on legs).  Risk Factors: hypertension, hyperlipidemia, angina, active smoker, physical inactivity  LDL goal: < 100  Diet: Most meals at home, not regular fried foods, minimal red meat   Exercise: none  Family History: none known (patient unaware of any cardiac disease in family members)  Social History: current smoker; trying to quit (using Chantix)  Labs: CHO 218; LDL 163 ; TG 144; HDL 26  Past Medical History:  Diagnosis Date  . Asthma   . Carpal tunnel syndrome on left   . Complication of anesthesia 07/2014   Mouth was swollen on inside, Front tooth was chipped  . Diabetes mellitus    19 years ago  . Diabetic neuropathy ([place])   . Diabetic retinopathy ([place])   . Essential hypertension 12/23/2015  . GERD (gastroesophageal reflux disease)   . Headache    Migraine- a long time ago  . Hypercholesteremia   . Hyperlipidemia 12/23/2015  . Hypertension   . Muscle spasms of lower extremity    legs and weakness  . Pneumonia   . Restless leg   . Type 1 diabetes mellitus ([place])   . Vitreous hemorrhage, left eye [facility])     Current Outpatient Prescriptions on File Prior to Visit  Medication Sig Dispense Refill  . albuterol (PROAIR HFA) 108 (90 Base) MCG/ACT inhaler 2 PUFFS EVERY 6 HOURS AS NEEDED for shortness of breath 3 each 3  . albuterol (PROVENTIL)  (2.5 MG/3ML) 0.083% nebulizer solution Take 3 mLs (2.5 mg total) by nebulization every 4 (four) hours as needed for wheezing or shortness of breath. 150 mL 1  . amLODipine (NORVASC) 10 MG tablet Take 1 tablet (10 mg total) by mouth as directed. 90 tablet 3  . aspirin EC 81 MG tablet Take 81 mg by mouth daily.    . carvedilol (COREG) 25 MG tablet Take 1 tablet (25 mg total) by mouth 2 (two) times daily with a meal. 180 tablet 3  . fluticasone furoate-vilanterol (BREO ELLIPTA) 100-25 MCG/INH AEPB Inhale 1 puff into the lungs daily. 2 each 0  . glucose blood (ONETOUCH VERIO) test strip Check BS tid - qid 300 each 3  . guaiFENesin-codeine 100-10 MG/5ML syrup Take 5 mLs by mouth 3 (three) times daily as needed for cough. 140 mL 0  . Insulin Glargine (BASAGLAR KWIKPEN) 100 UNIT/ML SOPN Inject 0.35 mLs (35 Units total) into the skin 2 (two) times daily. 60 mL 3  . Lancets (ONETOUCH ULTRASOFT) lancets USE AS INSTRUCTED TO MONITOR BLOOD SUGAR 3 TO 4 TIMES DAILY 300 each 0  . losartan (COZAAR) 100 MG tablet Take 1 tablet (100 mg total) by mouth daily. 90 tablet 3  . OVER THE COUNTER MEDICATION Take 2 tablets by mouth at bedtime. Restless leg relief    .  pantoprazole (PROTONIX) 40 MG tablet Take 1 tablet (40 mg total) by mouth daily. 90 tablet 3  . varenicline (CHANTIX STARTING MONTH PAK) 0.5 MG X 11 & 1 MG X 42 tablet Take one 0.5 mg tablet by mouth once daily for 3 days, then increase to one 0.5 mg tablet twice daily for 4 days, then increase to one 1 mg tablet twice daily. 53 tablet 0   No current facility-administered medications on file prior to visit.     Allergies  Allergen Reactions  . Fish-Derived Products Hives and Shortness Of Breath  . Demerol Itching and Rash  . Morphine And Related Itching and Rash       . Zocor [Simvastatin] Itching and Other (See Comments)    Leg pain    Assessment/Plan:  Hyperlipidemia -  Patient presets today for lipid management assessment and potential use of  PCSK9 inhibitor.  History of significant intolerance to stating noted , but no trial with rosuvastatin low dose or ezetimibe done in the past.  Smoking cessation therapy already started. Encouraged to increase excessive by walking or prefer activity, decrease fried foods and decrease red meat consumption. Will start Crestor (rosuvastatin) 5mg  three times weekly (Mondays/Wednedays/Fridays) today. Fasting lipid profile to be completed in 8 weeks if patient tolerating therapy.  Expected follow up with lipid clinic in 3 months .   Kodie Kishi Rodriguez-Guzman PharmD, [place] [id number] 04/08/2016 5:40 PM

## 2016-04-08 NOTE — Patient Instructions (Addendum)
Start taking Crestor (rosuvastatin) 5 mg three times per week (Mondays, Wednesdays and Fridays)  Call clinic if pain, muscle cramp or questions  Fasting lipid panel in 8 weeks if tolerating Crestor 5mg   .  Cholesterol Cholesterol is a fat. Your body needs a small amount of cholesterol. Cholesterol (plaque) may build up in your blood vessels (arteries). That makes you more likely to have a heart attack or stroke. You cannot feel your cholesterol level. Having a blood test is the only way to find out if your level is high. Keep your test results. Work with your doctor to keep your cholesterol at a good level. What do the results mean?  Total cholesterol is how much cholesterol is in your blood.  LDL is bad cholesterol. This is the type that can build up. Try to have low LDL.  HDL is good cholesterol. It cleans your blood vessels and carries LDL away. Try to have high HDL.  Triglycerides are fat that the body can store or burn for energy. What are good levels of cholesterol?  Total cholesterol below 200.  LDL below 100 is good for people who have health risks. LDL below 70 is good for people who have very high risks.  HDL above 40 is good. It is best to have HDL of 60 or higher.  Triglycerides below 150. How can I lower my cholesterol? Diet  Follow your diet program as told by your doctor.  Choose fish, white meat chicken, or [place] that is roasted or baked. Try not to eat red meat, fried foods, sausage, or lunch meats.  Eat lots of fresh fruits and vegetables.  Choose whole grains, beans, pasta, potatoes, and cereals.  Choose olive oil, corn oil, or canola oil. Only use small amounts.  Try not to eat butter, mayonnaise, shortening, or palm kernel oils.  Try not to eat foods with trans fats.  Choose low-fat or nonfat dairy foods.  Drink skim or nonfat milk.  Eat low-fat or nonfat yogurt and cheeses.  Try not to drink whole milk or cream.  Try not to eat ice cream,  egg yolks, or full-fat cheeses.  Healthy desserts include angel food cake, ginger snaps, animal crackers, hard candy, popsicles, and low-fat or nonfat frozen yogurt. Try not to eat pastries, cakes, pies, and cookies. Exercise  Follow your exercise program as told by your doctor.  Be more active. Try gardening, walking, and taking the stairs.  Ask your doctor about ways that you can be more active. Medicine  Take over-the-counter and prescription medicines only as told by your doctor. This information is not intended to replace advice given to you by your health care provider. Make sure you discuss any questions you have with your health care provider. Document Released: 06/12/2008 Document Revised: 10/16/2015 Document Reviewed: 09/26/2015 Elsevier Interactive Patient Education  2017 [employer].

## 2016-06-03 ENCOUNTER — Telehealth: Payer: Self-pay | Admitting: Pharmacist

## 2016-06-03 NOTE — Telephone Encounter (Signed)
Talked to patient's husband ( Tim). He is not sure if patient is taking Crestor or had to stop therapy deu to pian on her legs.    Patient to call back to f/u about low dose Crestor tolerability.  No trial with zetia yet either.

## 2016-06-04 DIAGNOSIS — M5431 Sciatica, right side: Secondary | ICD-10-CM | POA: Diagnosis not present

## 2016-06-04 DIAGNOSIS — M25551 Pain in right hip: Secondary | ICD-10-CM | POA: Diagnosis not present

## 2016-06-04 DIAGNOSIS — M545 Low back pain: Secondary | ICD-10-CM | POA: Diagnosis not present

## 2016-06-04 DIAGNOSIS — M7631 Iliotibial band syndrome, right leg: Secondary | ICD-10-CM | POA: Diagnosis not present

## 2016-06-11 ENCOUNTER — Ambulatory Visit (INDEPENDENT_AMBULATORY_CARE_PROVIDER_SITE_OTHER): Payer: Commercial Managed Care - HMO | Admitting: Pulmonary Disease

## 2016-06-11 ENCOUNTER — Encounter: Payer: Self-pay | Admitting: Pulmonary Disease

## 2016-06-11 VITALS — BP 128/74 | HR 94 | Ht 63.0 in | Wt 180.0 lb

## 2016-06-11 DIAGNOSIS — J4521 Mild intermittent asthma with (acute) exacerbation: Secondary | ICD-10-CM | POA: Diagnosis not present

## 2016-06-11 DIAGNOSIS — J45909 Unspecified asthma, uncomplicated: Secondary | ICD-10-CM | POA: Diagnosis not present

## 2016-06-11 DIAGNOSIS — F17210 Nicotine dependence, cigarettes, uncomplicated: Secondary | ICD-10-CM | POA: Diagnosis not present

## 2016-06-11 DIAGNOSIS — M25551 Pain in right hip: Secondary | ICD-10-CM | POA: Diagnosis not present

## 2016-06-11 DIAGNOSIS — M545 Low back pain: Secondary | ICD-10-CM | POA: Diagnosis not present

## 2016-06-11 MED ORDER — FLUTICASONE FUROATE-VILANTEROL 100-25 MCG/INH IN AEPB: 1.0000 | INHALATION_SPRAY | Freq: Every day | RESPIRATORY_TRACT | 0 refills | Status: AC

## 2016-06-11 NOTE — Patient Instructions (Signed)
Continue the breo. We will give samples  Work on smoking cessation Return in 6 months

## 2016-06-11 NOTE — Progress Notes (Signed)
[name]    [id number]    1975/05/07  Primary Care Physician:PICKARD,WARREN TOM, MD  Referring Physician: Susy Frizzle, MD [address] [place], [place] [address]  Chief complaint:  Follow up for  Mild persistent asthma  HPI: [name] is a 41 year old with asthma, diabetes, hypertension, cigarette smoker. He was evaluated in September 2017 for chest pain. She's had a left heart cath on 12/27/15 which showed normal coronaries with an EF of 55%. She has been elevated for PCCM for further evaluation.  She has complains of dyspnea on exertion and occasionally at rest. She has occasional wheezing. She's had cough with mucus production for the past 1 week. She was given a Z-Pak by her primary care physician with some improvement in symptoms. She is also prescribed a prednisone taper but she has not needed to use it. She has a diagnosis of asthma for several years and is maintained on Symbicort and albuterol. She feels that the Symbicort is not working. She uses the albuterol 3-4 times per week with no nighttime awakening. She has significant issues with seasonal allergies, postnasal drip, acid reflux.  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 However she quit her job several months ago.  Interim history: She continues on breo with out issue. Since her last visit there have been no exacerbations. She hardly needs to use her albuterol rescue inhaler. She continues to smoke. She wants to quit and has a prescription for Chantix but has not set a date for quitting.  Outpatient Encounter Prescriptions as of 06/11/2016  Medication Sig  . albuterol (PROAIR HFA) 108 (90 Base) MCG/ACT inhaler 2 PUFFS EVERY 6 HOURS AS NEEDED for shortness of breath  . albuterol (PROVENTIL) (2.5 MG/3ML) 0.083% nebulizer solution Take 3 mLs (2.5 mg total) by nebulization every 4 (four) hours as needed for wheezing or shortness of breath.  Marland Kitchen amLODipine  (NORVASC) 10 MG tablet Take 1 tablet (10 mg total) by mouth as directed.  Marland Kitchen aspirin EC 81 MG tablet Take 81 mg by mouth daily.  . carvedilol (COREG) 25 MG tablet Take 1 tablet (25 mg total) by mouth 2 (two) times daily with a meal.  . fluticasone furoate-vilanterol (BREO ELLIPTA) 100-25 MCG/INH AEPB Inhale 1 puff into the lungs daily.  Marland Kitchen glucose blood (ONETOUCH VERIO) test strip Check BS tid - qid  . Insulin Glargine (BASAGLAR KWIKPEN) 100 UNIT/ML SOPN Inject 0.35 mLs (35 Units total) into the skin 2 (two) times daily.  . Lancets (ONETOUCH ULTRASOFT) lancets USE AS INSTRUCTED TO MONITOR BLOOD SUGAR 3 TO 4 TIMES DAILY  . losartan (COZAAR) 100 MG tablet Take 1 tablet (100 mg total) by mouth daily.  Marland Kitchen OVER THE COUNTER MEDICATION Take 2 tablets by mouth at bedtime. Restless leg relief  . pantoprazole (PROTONIX) 40 MG tablet Take 1 tablet (40 mg total) by mouth daily.  . rosuvastatin (CRESTOR) 5 MG tablet Take 5 mg (1 tablet) every Monday, Wednesday and Friday  . [DISCONTINUED] guaiFENesin-codeine 100-10 MG/5ML syrup Take 5 mLs by mouth 3 (three) times daily as needed for cough.  . varenicline (CHANTIX STARTING MONTH PAK) 0.5 MG X 11 & 1 MG X 42 tablet Take one 0.5 mg tablet by mouth once daily for 3 days, then increase to one 0.5 mg tablet twice daily for 4 days, then increase to one 1 mg tablet twice daily. (Patient not taking: Reported on 06/11/2016)  No facility-administered encounter medications on file as of 06/11/2016.     Allergies as of 06/11/2016 - Review Complete 06/11/2016  Allergen Reaction Noted  . Fish-derived products Hives and Shortness Of Breath 08/28/2014  . Demerol Itching and Rash 01/07/2011  . Morphine and related Itching and Rash 01/07/2011  . Zocor [simvastatin] Itching and Other (See Comments) 04/22/2011    Past Medical History:  Diagnosis Date  . Asthma   . Carpal tunnel syndrome on left   . Complication of anesthesia 07/2014   Mouth was swollen on inside, Front tooth  was chipped  . Diabetes mellitus    19 years ago  . Diabetic neuropathy ([place])   . Diabetic retinopathy ([place])   . Essential hypertension 12/23/2015  . GERD (gastroesophageal reflux disease)   . Headache    Migraine- a long time ago  . Hypercholesteremia   . Hyperlipidemia 12/23/2015  . Hypertension   . Muscle spasms of lower extremity    legs and weakness  . Pneumonia   . Restless leg   . Type 1 diabetes mellitus ([place])   . Vitreous hemorrhage, left eye [facility])     Past Surgical History:  Procedure Laterality Date  . CARDIAC CATHETERIZATION N/A 12/27/2015   Procedure: Left Heart Cath and Coronary Angiography;  Surgeon: Troy Sine, MD;  Location: [place] [facility];  Service: Cardiovascular;  Laterality: N/A;  . [place], 2002  . DILATION AND CURETTAGE OF UTERUS  1997  . GAS/FLUID EXCHANGE Right 08/28/2014   Procedure: GAS/FLUID EXCHANGE;  Surgeon: Hayden Pedro, MD;  Location: [place];  Service: Ophthalmology;  Laterality: Right;  . LASER PHOTO ABLATION     left x 2, right x 1  . LASER PHOTO ABLATION Right 08/28/2014   Procedure: LASER PHOTO ABLATION;  Surgeon: Hayden Pedro, MD;  Location: [place];  Service: Ophthalmology;  Laterality: Right;  . MEMBRANE PEEL Right 08/28/2014   Procedure: MEMBRANE PEEL;  Surgeon: Hayden Pedro, MD;  Location: [place];  Service: Ophthalmology;  Laterality: Right;  . PARS PLANA VITRECTOMY Right 08/28/2014   Procedure: PARS PLANA VITRECTOMY WITH 25 GAUGE;  Surgeon: Hayden Pedro, MD;  Location: [place];  Service: Ophthalmology;  Laterality: Right;  . PARS PLANA VITRECTOMY Left 12/24/2014   Procedure: LEFT PARS PLANA VITRECTOMY WITH 25 GAUGE WITH ENDOLASER ;  Surgeon: Hurman Horn, MD;  Location: [place];  Service: Ophthalmology;  Laterality: Left;  . TUBAL LIGATION  2002  . WISDOM TOOTH EXTRACTION     age 51    Family History  Problem Relation Age of Onset  . Diabetes Mother   . Parkinsonism Mother   . Diabetes Father   .  Hypertension Father   . Diabetes Brother   . Heart failure Maternal Grandmother   . Diabetes Maternal Grandmother     Social History   Social History  . Marital status: Married    Spouse name: N/A  . Number of children: N/A  . Years of education: N/A   Occupational History  . Not on file.   Social History Main Topics  . Smoking status: Current Every Day Smoker    Packs/day: 1.00    Years: 9.00    Types: Cigarettes  . Smokeless tobacco: Never Used  . Alcohol use No  . Drug use: No  . Sexual activity: Yes    Partners: Male    Birth control/ protection: None, Surgical     Comment: tubal  Other Topics Concern  . Not on file   Social History Narrative   Married, lives with spouse and 2 kids   Occupation: Chemical engineer      Review of systems: Review of Systems  Constitutional: Negative for fever and chills.  HENT: Negative.   Eyes: Negative for blurred vision.  Respiratory: as per HPI  Cardiovascular: Negative for chest pain and palpitations.  Gastrointestinal: Negative for vomiting, diarrhea, blood per rectum. Genitourinary: Negative for dysuria, urgency, frequency and hematuria.  Musculoskeletal: Negative for myalgias, back pain and joint pain.  Skin: Negative for itching and rash.  Neurological: Negative for dizziness, tremors, focal weakness, seizures and loss of consciousness.  Endo/Heme/Allergies: Negative for environmental allergies.  Psychiatric/Behavioral: Negative for depression, suicidal ideas and hallucinations.  All other systems reviewed and are negative.  Physical Exam: Blood pressure 128/74, pulse 94, height 5\' 3"  (1.6 m), weight 180 lb (81.6 kg), SpO2 98 %. Gen:      No acute distress HEENT:  EOMI, sclera anicteric Neck:     No masses; no thyromegaly Lungs:    Clear to auscultation bilaterally; normal respiratory effort CV:         Regular rate and rhythm; no murmurs Abd:      + bowel sounds; soft, non-tender; no palpable masses, no  distension Ext:    No edema; adequate peripheral perfusion Skin:      Warm and dry; no rash Neuro: alert and oriented x 3 Psych: normal mood and affect  Data Reviewed: LHC 12/27/15:-Normal coronary arteries.  LVEF 55%. Echo 02/13/16-  Left ventricle: The cavity size was normal. Wall thickness was normal. Systolic function was normal. The estimated ejection fraction was in the range of 60% to 65%. Wall motion was normal; there were no regional wall motion abnormalities. Left ventricular diastolic function parameters were normal  CBC with diff 02/19/16- WBC 8.9, Eos 1%, absolute eos count- 89   Imaging Chest x-ray 06/16/10-right lower lobe infiltrate. Chest x-ray 02/25/16-persistent right lower lobe infiltrate.  CT chest 03/18/16-lungs are clear. I have reviewed all images personally.  FENO 02/25/16- 24 Blood allergy profile 02/25/16 Negative, IgE 19  Assessment:  #1 Asthmatic bronchitis. Mrs. Moxley has asthma with history of seasonal allergies. Her postnasal drip, GERD and likely contributing to the problem. She is stable on breo and will continue the same  #2 Abnormal CXR She's had a chest x-ray which shows persistent right lower lobe infiltrate.However CT scan is normal. No follow up needed   #3 Active smoker She continues to smoke but is trying to quit. Encouraged her to quit. She already has a prescription for chantix. Time spent in counseling-5 minutes.  Plan/Recommendations: - Continue Breo - Smoking cessation  Marshell Garfinkel MD [facility] Pager [phone] 06/11/2016, 10:35 AM  CC: Susy Frizzle, MD

## 2016-06-12 NOTE — Telephone Encounter (Signed)
Talked to husband again.  Patient will call back Monday ot Tuesday next week to let us know if okay to sent Rx for Zetia.  She was NOT able to tolerate Crestor.

## 2016-06-16 DIAGNOSIS — M545 Low back pain: Secondary | ICD-10-CM | POA: Diagnosis not present

## 2016-06-16 DIAGNOSIS — M25551 Pain in right hip: Secondary | ICD-10-CM | POA: Diagnosis not present

## 2016-07-30 ENCOUNTER — Telehealth: Payer: Self-pay | Admitting: Pharmacist

## 2016-07-30 NOTE — Telephone Encounter (Signed)
LM with husband Karolyne Timmons.  Failed multiple statin therapy including low dose rosuvastatin.    May try Zetia 10mg  daily , then repeat fasting lipids.

## 2016-11-05 ENCOUNTER — Other Ambulatory Visit: Payer: Self-pay | Admitting: Cardiovascular Disease

## 2016-11-05 ENCOUNTER — Other Ambulatory Visit: Payer: Self-pay | Admitting: Family Medicine

## 2016-11-05 NOTE — Telephone Encounter (Signed)
Refill Request.  

## 2016-11-06 ENCOUNTER — Other Ambulatory Visit: Payer: Self-pay | Admitting: Family Medicine

## 2016-11-06 MED ORDER — INSULIN LISPRO 100 UNIT/ML (KWIKPEN): PEN_INJECTOR | SUBCUTANEOUS | 5 refills | Status: DC

## 2017-02-16 ENCOUNTER — Other Ambulatory Visit: Payer: Self-pay | Admitting: Family Medicine

## 2017-02-17 ENCOUNTER — Other Ambulatory Visit: Payer: Self-pay | Admitting: Family Medicine

## 2017-02-17 MED ORDER — INSULIN PEN NEEDLE 32G X 4 MM MISC: 11 refills | Status: DC

## 2017-03-24 ENCOUNTER — Other Ambulatory Visit: Payer: Self-pay | Admitting: Cardiovascular Disease

## 2017-03-24 ENCOUNTER — Other Ambulatory Visit: Payer: Self-pay | Admitting: Family Medicine

## 2017-03-24 NOTE — Telephone Encounter (Signed)
Please review for refill, Thanks !  

## 2017-04-08 ENCOUNTER — Ambulatory Visit (INDEPENDENT_AMBULATORY_CARE_PROVIDER_SITE_OTHER): Payer: 59 | Admitting: Family Medicine

## 2017-04-08 ENCOUNTER — Encounter: Payer: Self-pay | Admitting: Family Medicine

## 2017-04-08 VITALS — BP 130/70 | HR 94 | Temp 98.2°F | Resp 18 | Ht 63.0 in | Wt 179.0 lb

## 2017-04-08 DIAGNOSIS — E10649 Type 1 diabetes mellitus with hypoglycemia without coma: Secondary | ICD-10-CM | POA: Diagnosis not present

## 2017-04-08 DIAGNOSIS — Z23 Encounter for immunization: Secondary | ICD-10-CM

## 2017-04-08 MED ORDER — INSULIN LISPRO 100 UNIT/ML ~~LOC~~ SOLN: SUBCUTANEOUS | 1 refills | Status: DC

## 2017-04-08 NOTE — Progress Notes (Signed)
Subjective:    Patient ID: [name], female    DOB: Mar 15, 1976, 42 y.o.   MRN: [id number]  Medication Refill    02/07/15 Patient is here today for follow-up of her diabetes. She states that her blood sugars are typically ranging between 70 and 150. She denies any hypoglycemia. She denies any polyuria, polydipsia. She is working very hard to control her sugars. Her blood pressures well controlled today at 126/80. However her sugar this morning is low at 47. This is because she is fasting to obtain her lab work today. This is causing her heart rate to be elevated and making her feel nauseated and tremulous. Therefore I gave the patient some candy while she was sitting in the office.  At that time, my plan was: Blood sugars sound well controlled. I will make no changes in her insulin dose at the present time. I will check a hemoglobin A1c. Goal hemoglobin A1c is less than 7.0 She is also due to check a fasting lipid panel. Goal LDL cholesterol is less than 100.  Her blood pressure today is well controlled. She does want help with smoking cessation. I will give the patient a Chantix starter pack. We discussed the increased risk of nausea abnormal dreams and depression on this medication and she is willing to accept the risk. If she needs to continue the medication next month I will prescribe a continuing Dosepak. I congratulated the patient on trying to quit smoking  06/04/15 Here today for follow up.  Her insurance is requiring her to switch from Lantus to basaglar.  Her fasting blood sugars are below 130. Her two-hour postprandial sugars are less than 130 as well. She is also experiencing hypoglycemic episodes 2 hours after she eats a meal. At present she is using Lantus 70 units in the morning and 5 units of NovoLog with meals. He continues to smoke.  AT that time, my plan was: Check fasting lab work. Goal hemoglobin A1c is less than 7.0. I would like the patient to discontinue rapid acting insulin  with meals due to the postprandial hyperglycemia but continue Lantus/glargine 70 units daily. Her blood pressures controlled. I will also check a fasting lipid panel with a goal LDL of less than 100. I strongly encouraged smoking cessation.  11/11/15 Patient is still smoking. She's had to take prednisone again for bronchitis with wheezing and bronchospasm. She has a history of asthma but I explained to the patient I feel she is also developing and emphysema. She really needs to quit smoking. She is interested in trying Chantix again. Her blood sugars have been very well controlled except when she is on prednisone. Typically her fasting blood sugars between 70 and 80. She is currently on long-acting insulin 35 units twice a day. She had to discontinue rapid acting insulin due to hypoglycemic episodes. Her blood pressures borderline today however this is because she has not taken her medication yet. She denies any chest pain shortness of breath or dyspnea on exertion. She denies any myalgias or right upper quadrant pain. Unfortunately she fractured her left fifth toe at the MTP joint approximately 8 weeks ago. She's been in a postop shoe since that time. The doctor that she saw his recommended she discontinue the postop shoe in the next week. I recommended the patient call me if she has pain when she comes out of the postop shoe so that we can repeat an x-ray to ensure adequate healing.  AT that time, my plan was:  Strongly recommended smoking cessation again. I gave the patient prescription for Chantix. Her blood pressure is acceptable. I will check a hemoglobin A1c as well as a fasting lipid panel. Her goal hemoglobin A1c will be less than 7. Her goal LDL cholesterol be less than 100. Call me back if pain returns in her foot when she comes out of the postop shoe. The present time she is pain-free.  11/21/15 Had 2 episodes of atypical chest pain. Both occurred at rest while changing diapers with minimal  exertion. She describes it as a pressure-like sensation in the right and left chest in a bandlike pattern. The pressure-like sensation lasted for a few minutes and then resolve spontaneously. While it occurred she felt short of breath however she denies any other episodes of chest pain with activity. She does yard work without chest pain. She denies any chest pain or shortness of breath with walking or sexual activity. EKG today shows sinus tachycardia but there is no evidence of ischemia or infarction. She continues to smoke. She has a long history of uncontrolled diabetes although she has done very well in controlling that as of late. Her blood pressure is elevated today. Her cholesterol is also elevated as was discovered on her recent lab work..  At that time, my plan was: Her chest pain is somewhat atypical however she has numerous risk factors including insulin-dependent diabetes mellitus, hypertension, hyperlipidemia, and tobacco abuse. Therefore I would like to arrange for her to see a cardiologist as soon as possible for stress test. In the meantime I will her to increase her carvedilol to 25 mg by mouth twice a day to address her elevated blood pressure as well as her tachycardia and I continue to encourage her to quit smoking. She is to go the hospital immediately should she develop chest pain at rest. Today she is pain-free and there is no evidence of ACS at the present time.  02/19/16 Here today for follow-up. Patient saw cardiology who performed a catheterization. It was no obstruction found in her coronary arteries. Echocardiogram revealed an ejection fraction of 60% with no wall motion abnormalities and no valvular dysfunction. Therefore there is no cardiac etiology of her shortness of breath or chest pain. She continues to complain of dyspnea on exertion and heaviness in her chest with exertion. However she continues to smoke. She is not using her Symbicort on a regular basis. She also has an  underlying history of asthma and I believe emphysema secondary to smoking. Patient is here today to follow-up her diabetes and recheck her fasting lipid panel as well as her A1c. However she also reports 2 weeks of illness. She reports worsening cough, shortness of breath, increasing chest congestion. On examination today, the patient has diffuse expiratory wheezing. She has rhonchorous breath sounds throughout all 4 lung fields. She has poor air movement.  At that time, my plan was: Her blood pressure today is well controlled. I will check a fasting lipid panel. Given her diabetes, her goal LDL cholesterol is less than 100. I will also check a hemoglobin A1c. For insulin-dependent diabetics, her goal A1c is less than 7. Regarding her shortness of breath and dyspnea on exertion and atypical chest pain I believe this is likely related to smoking, underlying COPD/asthma, medical noncompliance with Symbicort, and possibly anxiety. First and foremost, I recommended that the patient quit smoking. Second I commended compliance with Symbicort 2 puffs inhaled twice daily. While we need to treat her current COPD exacerbation. Therefore I'll  start the patient on a prednisone taper pack in addition to a Z-Pak. Recheck next week if no better or sooner if worse  04/08/17 Patient has not been seen since. She continues to smoke. She is using glargine 35 units twice daily as her maintenance insulin and then Humalog with meals if needed. She will only use the Humalog if her sugars are greater than 200. She states that she has not use the Humalog in the last 2 months. She states that her fasting blood sugars in the morning are frequently under 100. She also occasionally has hypoglycemic episodes in the 50s and 60s with symptomatic hypoglycemia including dizzy spells, weakness, and confusion.  She denies any polyuria, polydipsia, or bruit vision. She denies any chest pain or shortness of breath. Past Medical History:  Diagnosis  Date  . Asthma   . Carpal tunnel syndrome on left   . Complication of anesthesia 07/2014   Mouth was swollen on inside, Front tooth was chipped  . Diabetes mellitus    19 years ago  . Diabetic neuropathy ([place])   . Diabetic retinopathy ([place])   . Essential hypertension 12/23/2015  . GERD (gastroesophageal reflux disease)   . Headache    Migraine- a long time ago  . Hypercholesteremia   . Hyperlipidemia 12/23/2015  . Hypertension   . Muscle spasms of lower extremity    legs and weakness  . Pneumonia   . Restless leg   . Type 1 diabetes mellitus ([place])   . Vitreous hemorrhage, left eye [facility])    Past Surgical History:  Procedure Laterality Date  . CARDIAC CATHETERIZATION N/A 12/27/2015   Procedure: Left Heart Cath and Coronary Angiography;  Surgeon: Troy Sine, MD;  Location: [place] [facility];  Service: Cardiovascular;  Laterality: N/A;  . [place], 2002  . DILATION AND CURETTAGE OF UTERUS  1997  . GAS/FLUID EXCHANGE Right 08/28/2014   Procedure: GAS/FLUID EXCHANGE;  Surgeon: Hayden Pedro, MD;  Location: [place];  Service: Ophthalmology;  Laterality: Right;  . LASER PHOTO ABLATION     left x 2, right x 1  . LASER PHOTO ABLATION Right 08/28/2014   Procedure: LASER PHOTO ABLATION;  Surgeon: Hayden Pedro, MD;  Location: [place];  Service: Ophthalmology;  Laterality: Right;  . MEMBRANE PEEL Right 08/28/2014   Procedure: MEMBRANE PEEL;  Surgeon: Hayden Pedro, MD;  Location: [place];  Service: Ophthalmology;  Laterality: Right;  . PARS PLANA VITRECTOMY Right 08/28/2014   Procedure: PARS PLANA VITRECTOMY WITH 25 GAUGE;  Surgeon: Hayden Pedro, MD;  Location: [facility];  Service: Ophthalmology;  Laterality: Right;  . PARS PLANA VITRECTOMY Left 12/24/2014   Procedure: LEFT PARS PLANA VITRECTOMY WITH 25 GAUGE WITH ENDOLASER ;  Surgeon: Hurman Horn, MD;  Location: [facility];  Service: Ophthalmology;  Laterality: Left;  . TUBAL LIGATION  2002  . WISDOM TOOTH EXTRACTION      age 78   Current Outpatient Medications on File Prior to Visit  Medication Sig Dispense Refill  . albuterol (PROAIR HFA) 108 (90 Base) MCG/ACT inhaler 2 PUFFS EVERY 6 HOURS AS NEEDED for shortness of breath 3 each 3  . albuterol (PROVENTIL) (2.5 MG/3ML) 0.083% nebulizer solution Take 3 mLs (2.5 mg total) by nebulization every 4 (four) hours as needed for wheezing or shortness of breath. 150 mL 1  . amLODipine (NORVASC) 10 MG tablet TAKE ONE TABLET BY MOUTH AS DIRECTED 30 tablet 0  . aspirin EC 81  MG tablet Take 81 mg by mouth daily.    . carvedilol (COREG) 25 MG tablet TAKE ONE TABLET BY MOUTH TWICE DAILY WITH A MEAL 60 tablet 0  . glucose blood (ONETOUCH VERIO) test strip Check BS tid - qid 300 each 3  . Insulin Glargine (BASAGLAR KWIKPEN) 100 UNIT/ML SOPN INJECT 0.35MLS (35 UNITS TOTAL) INTO THE SKIN 2 TIMES DAILY 5 pen 0  . Insulin Pen Needle (RELION PEN NEEDLES) 32G X 4 MM MISC USE AS DIRECTED THREE TIMES DAILY 100 each 11  . Lancets (ONETOUCH ULTRASOFT) lancets USE AS INSTRUCTED TO MONITOR BLOOD SUGAR 3 TO 4 TIMES DAILY 300 each 0  . losartan (COZAAR) 100 MG tablet TAKE 1 TABLET BY MOUTH DAILY 30 tablet 0  . OVER THE COUNTER MEDICATION Take 2 tablets by mouth at bedtime. Restless leg relief    . pantoprazole (PROTONIX) 40 MG tablet TAKE 1 TABLET BY MOUTH DAILY 31 tablet 0  . rosuvastatin (CRESTOR) 5 MG tablet Take 5 mg (1 tablet) every Monday, Wednesday and Friday 15 tablet 2   No current facility-administered medications on file prior to visit.    Allergies  Allergen Reactions  . Fish-Derived Products Hives and Shortness Of Breath  . Demerol Itching and Rash  . Morphine And Related Itching and Rash       . Zocor [Simvastatin] Itching and Other (See Comments)    Leg pain   Social History   Socioeconomic History  . Marital status: Married    Spouse name: Not on file  . Number of children: Not on file  . Years of education: Not on file  . Highest education level: Not on file    Social Needs  . Financial resource strain: Not on file  . Food insecurity - worry: Not on file  . Food insecurity - inability: Not on file  . Transportation needs - medical: Not on file  . Transportation needs - non-medical: Not on file  Occupational History  . Not on file  Tobacco Use  . Smoking status: Current Every Day Smoker    Packs/day: 1.00    Years: 9.00    Pack years: 9.00    Types: Cigarettes  . Smokeless tobacco: Never Used  Substance and Sexual Activity  . Alcohol use: No  . Drug use: No  . Sexual activity: Yes    Partners: Male    Birth control/protection: None, Surgical    Comment: tubal   Other Topics Concern  . Not on file  Social History Narrative   Married, lives with spouse and 2 kids   Occupation: Chemical engineer     Review of Systems  All other systems reviewed and are negative.      Objective:   Physical Exam  Constitutional: She appears well-developed and well-nourished.  Neck: Neck supple. No thyromegaly present.  Cardiovascular: Normal rate, regular rhythm, normal heart sounds and intact distal pulses.  No murmur heard. Pulmonary/Chest: Effort normal. No respiratory distress. She has no decreased breath sounds. She has no wheezes. She has no rhonchi. She has no rales.  Abdominal: Soft. Bowel sounds are normal. She exhibits no distension. There is no tenderness. There is no rebound and no guarding.  Musculoskeletal: She exhibits no edema.  Lymphadenopathy:    She has no cervical adenopathy.  Vitals reviewed.         Assessment & Plan:  Uncontrolled type 1 diabetes mellitus with hypoglycemia without coma ([place]) - Plan: Hemoglobin A1c, COMPLETE METABOLIC PANEL WITH GFR, Lipid  panel, Microalbumin, urine  Needs flu shot - Plan: Flu Vaccine QUAD 36+ mos IM  Need for prophylactic vaccination against Streptococcus pneumoniae (pneumococcus) - Plan: Pneumococcal polysaccharide vaccine 23-valent greater than or equal to 2yo subcutaneous/IM  I  continue to encourage the patient to quit smoking. Patient received a booster on Pneumovax 23 today. She also received her flu shot today. I will check a hemoglobin A1c. Goal hemoglobin A1c is less than 7. I'm concerned about her hyperglycemia. I recommended decreasing glargine to 30 units twice a day. I will also check a fasting lipid panel. Goal LDL cholesterol is less than 100 and I will check a urine microalbumin as well. Her blood pressure is well controlled today

## 2017-04-09 LAB — COMPLETE METABOLIC PANEL WITH GFR
AG Ratio: 1.7 (calc) (ref 1.0–2.5)
ALKALINE PHOSPHATASE (APISO): 67 U/L (ref 33–115)
ALT: 19 U/L (ref 6–29)
AST: 17 U/L (ref 10–30)
Albumin: 4.4 g/dL (ref 3.6–5.1)
BILIRUBIN TOTAL: 0.4 mg/dL (ref 0.2–1.2)
BUN: 10 mg/dL (ref 7–25)
CHLORIDE: 106 mmol/L (ref 98–110)
CO2: 28 mmol/L (ref 20–32)
Calcium: 9.5 mg/dL (ref 8.6–10.2)
Creat: 0.71 mg/dL (ref 0.50–1.10)
GFR, Est African American: 121 mL/min/{1.73_m2} (ref >=60)
GFR, Est Non African American: 104 mL/min/{1.73_m2} (ref >=60)
Globulin: 2.6 g/dL (calc) (ref 1.9–3.7)
Glucose, Bld: 73 mg/dL (ref 65–99)
Potassium: 4.1 mmol/L (ref 3.5–5.3)
Sodium: 141 mmol/L (ref 135–146)
TOTAL PROTEIN: 7 g/dL (ref 6.1–8.1)

## 2017-04-09 LAB — LIPID PANEL
CHOLESTEROL: 240 mg/dL — AB (ref <200)
HDL: 30 mg/dL — ABNORMAL LOW (ref >50)
LDL CHOLESTEROL (CALC): 187 mg/dL — AB
Non-HDL Cholesterol (Calc): 210 mg/dL (calc) — ABNORMAL HIGH (ref <130)
TRIGLYCERIDES: 107 mg/dL (ref <150)
Total CHOL/HDL Ratio: 8 (calc) — ABNORMAL HIGH (ref <5.0)

## 2017-04-09 LAB — HEMOGLOBIN A1C
Hgb A1c MFr Bld: 7.1 % of total Hgb — ABNORMAL HIGH (ref <5.7)
Mean Plasma Glucose: 157 (calc)
eAG (mmol/L): 8.7 (calc)

## 2017-04-09 LAB — MICROALBUMIN, URINE: MICROALB UR: 0.5 mg/dL

## 2017-04-13 ENCOUNTER — Ambulatory Visit (INDEPENDENT_AMBULATORY_CARE_PROVIDER_SITE_OTHER): Payer: 59 | Admitting: Orthopaedic Surgery

## 2017-04-13 ENCOUNTER — Encounter (INDEPENDENT_AMBULATORY_CARE_PROVIDER_SITE_OTHER): Payer: Self-pay | Admitting: Orthopaedic Surgery

## 2017-04-13 DIAGNOSIS — G5603 Carpal tunnel syndrome, bilateral upper limbs: Secondary | ICD-10-CM | POA: Diagnosis not present

## 2017-04-13 MED ORDER — METHYLPREDNISOLONE ACETATE 40 MG/ML IJ SUSP
13.3300 mg | INTRAMUSCULAR | Status: AC | PRN
  Administered 2017-04-13: 13.33 mg

## 2017-04-13 MED ORDER — LIDOCAINE HCL 1 % IJ SOLN
1.0000 mL | INTRAMUSCULAR | Status: AC | PRN
  Administered 2017-04-13: 1 mL

## 2017-04-13 MED ORDER — BUPIVACAINE HCL 0.25 % IJ SOLN
0.3300 mL | INTRAMUSCULAR | Status: AC | PRN
  Administered 2017-04-13: .33 mL

## 2017-04-13 NOTE — Progress Notes (Signed)
Office Visit Note   Patient: [name]           Date of Birth: [birth year]           MRN: [id number] Visit Date: 04/13/2017              Requested by: Susy Frizzle, MD [address] [place], [place] [address] PCP: Susy Frizzle, MD   Assessment & Plan: Visit Diagnoses:  1. Carpal tunnel syndrome, bilateral     Plan: Due to crystals underlying diabetes and the fact that the previous cortisone injection to the left wrist her blood sugar up to the 400 range we are going to inject her left less symptomatic wrist with Toradol today.  In regards to the right side, we are going to inject this with less Depo-Medrol then we injected the left wrist with in hopes of not dramatically increasing her blood sugar like we did on the left.  If the Toradol injection to the left wrist fails to improve her symptoms we are happy to have her follow-up in a few weeks time to inject this with cortisone.  In regards to the right wrist, if she is not to get improvement with the injection there we will send her for nerve conduction studies.  Follow-Up Instructions: Return if symptoms worsen or fail to improve.   Orders:  No orders of the defined types were placed in this encounter.  No orders of the defined types were placed in this encounter.     Procedures: Left hand injection was with toradol rather than depomedrol Hand/UE Inj: bilateral carpal tunnel for carpal tunnel syndrome on 04/13/2017 10:09 AM Indications: pain Details: 25 G needle, volar approach Medications (Right): 1 mL lidocaine 1 %; 0.33 mL bupivacaine 0.25 %; 13.33 mg methylPREDNISolone acetate 40 MG/ML Medications (Left): 1 mL lidocaine 1 %; 0.33 mL bupivacaine 0.25 %      Clinical Data: No additional findings.   Subjective: Chief Complaint  Patient presents with  . Hand Pain    bilateral-right worse than left    HPI [name] is a pleasant 42 year old diabetic female who presents our clinic today with  recurrent left wrist pain and new onset right wrist pain.  She has a history of clinically diagnosed left carpal tunnel syndrome from approximately 2-1/2 years ago.  However nerve conduction studies 02/01/2015 were negative.  She had this injected in the past with marked improvement in symptoms.  She does state that the symptoms are starting to return.  She has pain to the dorsum of the wrist as well as numbness tingling burning to the median nerve distribution.  This is worse at night.  She is having similar symptoms on the right which are actually worse than they currently are on the left.  She has tried night splints with minimal to no relief of symptoms.  No previous injection on the right.  No nerve conduction study on the right.  Review of Systems as detailed in HPI.  All others reviewed and are negative.   Objective: Vital Signs: There were no vitals taken for this visit.  Physical Exam well-developed well-nourished female no acute distress.  Alert and oriented x3.  Ortho Exam examination of the right wrist reveals a positive Phalen, positive Tinel and no thenar atrophy.  Examination of the left, reveals negative Phalen's negative Tinel and no thenar atrophy.  Specialty Comments:  No specialty comments available.  Imaging: No results found.   PMFS History: Patient Active  Problem List   Diagnosis Date Noted  . Carpal tunnel syndrome, bilateral 04/13/2017  . Chest pain with moderate risk for cardiac etiology   . Angina pectoris ([place])   . Essential hypertension 12/23/2015  . Hyperlipidemia 12/23/2015  . Tobacco use disorder 02/25/2015  . Vitreous hemorrhage of right eye ([place]) 08/28/2014  . Vitreous hemorrhage ([place]) 08/08/2014  . Proliferative diabetic retinopathy ([place]) 08/08/2014  . Asthma with bronchitis 12/26/2012  . Type II diabetes mellitus ([place]) 12/26/2012  . Abnormal HPV Pap smear, poor sample 01/29/2011   Past Medical History:  Diagnosis Date  . Asthma   . Carpal  tunnel syndrome on left   . Complication of anesthesia 07/2014   Mouth was swollen on inside, Front tooth was chipped  . Diabetes mellitus    19 years ago  . Diabetic neuropathy ([place])   . Diabetic retinopathy ([place])   . Essential hypertension 12/23/2015  . GERD (gastroesophageal reflux disease)   . Headache    Migraine- a long time ago  . Hypercholesteremia   . Hyperlipidemia 12/23/2015  . Hypertension   . Muscle spasms of lower extremity    legs and weakness  . Pneumonia   . Restless leg   . Type 1 diabetes mellitus ([place])   . Vitreous hemorrhage, left eye ([facility])     Family History  Problem Relation Age of Onset  . Diabetes Mother   . Parkinsonism Mother   . Diabetes Father   . Hypertension Father   . Diabetes Brother   . Heart failure Maternal Grandmother   . Diabetes Maternal Grandmother     Past Surgical History:  Procedure Laterality Date  . CARDIAC CATHETERIZATION N/A 12/27/2015   Procedure: Left Heart Cath and Coronary Angiography;  Surgeon: Troy Sine, MD;  Location: [facility];  Service: Cardiovascular;  Laterality: N/A;  . [place], 2002  . DILATION AND CURETTAGE OF UTERUS  1997  . GAS/FLUID EXCHANGE Right 08/28/2014   Procedure: GAS/FLUID EXCHANGE;  Surgeon: Hayden Pedro, MD;  Location: [facility];  Service: Ophthalmology;  Laterality: Right;  . LASER PHOTO ABLATION     left x 2, right x 1  . LASER PHOTO ABLATION Right 08/28/2014   Procedure: LASER PHOTO ABLATION;  Surgeon: Hayden Pedro, MD;  Location: [facility];  Service: Ophthalmology;  Laterality: Right;  . MEMBRANE PEEL Right 08/28/2014   Procedure: MEMBRANE PEEL;  Surgeon: Hayden Pedro, MD;  Location: [facility];  Service: Ophthalmology;  Laterality: Right;  . PARS PLANA VITRECTOMY Right 08/28/2014   Procedure: PARS PLANA VITRECTOMY WITH 25 GAUGE;  Surgeon: Hayden Pedro, MD;  Location: [facility];  Service: Ophthalmology;  Laterality: Right;  . PARS PLANA VITRECTOMY Left 12/24/2014    Procedure: LEFT PARS PLANA VITRECTOMY WITH 25 GAUGE WITH ENDOLASER ;  Surgeon: Hurman Horn, MD;  Location: [facility];  Service: Ophthalmology;  Laterality: Left;  . TUBAL LIGATION  2002  . WISDOM TOOTH EXTRACTION     age 12   Social History   Occupational History  . Not on file  Tobacco Use  . Smoking status: Current Every Day Smoker    Packs/day: 1.00    Years: 9.00    Pack years: 9.00    Types: Cigarettes  . Smokeless tobacco: Never Used  Substance and Sexual Activity  . Alcohol use: No  . Drug use: No  . Sexual activity: Yes    Partners: Male    Birth control/protection: None, Surgical  Comment: tubal

## 2017-04-14 ENCOUNTER — Other Ambulatory Visit: Payer: Self-pay | Admitting: Family Medicine

## 2017-04-14 MED ORDER — EZETIMIBE 10 MG PO TABS: 10.0000 mg | ORAL_TABLET | Freq: Every day | ORAL | 3 refills | Status: DC

## 2017-05-02 ENCOUNTER — Other Ambulatory Visit: Payer: Self-pay | Admitting: Family Medicine

## 2017-05-02 ENCOUNTER — Other Ambulatory Visit: Payer: Self-pay | Admitting: Cardiovascular Disease

## 2017-05-03 NOTE — Telephone Encounter (Signed)
[name] patient

## 2017-05-03 NOTE — Telephone Encounter (Signed)
Rx(s) sent to pharmacy electronically.  

## 2017-05-03 NOTE — Telephone Encounter (Signed)
Refill Request.  

## 2017-06-09 ENCOUNTER — Other Ambulatory Visit: Payer: Self-pay | Admitting: Cardiovascular Disease

## 2017-06-09 ENCOUNTER — Other Ambulatory Visit: Payer: Self-pay | Admitting: Family Medicine

## 2017-06-09 NOTE — Telephone Encounter (Signed)
Please review for refill, Thanks !  

## 2017-06-11 NOTE — Telephone Encounter (Signed)
REFILL 

## 2017-07-08 ENCOUNTER — Ambulatory Visit (INDEPENDENT_AMBULATORY_CARE_PROVIDER_SITE_OTHER): Payer: 59 | Admitting: Family Medicine

## 2017-07-08 ENCOUNTER — Other Ambulatory Visit: Payer: Self-pay | Admitting: *Deleted

## 2017-07-08 ENCOUNTER — Other Ambulatory Visit: Payer: Self-pay

## 2017-07-08 ENCOUNTER — Encounter: Payer: Self-pay | Admitting: Family Medicine

## 2017-07-08 VITALS — BP 126/70 | HR 88 | Temp 98.4°F | Resp 16 | Ht 63.0 in | Wt 173.0 lb

## 2017-07-08 DIAGNOSIS — E7800 Pure hypercholesterolemia, unspecified: Secondary | ICD-10-CM | POA: Diagnosis not present

## 2017-07-08 DIAGNOSIS — I10 Essential (primary) hypertension: Secondary | ICD-10-CM | POA: Diagnosis not present

## 2017-07-08 DIAGNOSIS — E108 Type 1 diabetes mellitus with unspecified complications: Secondary | ICD-10-CM | POA: Diagnosis not present

## 2017-07-08 DIAGNOSIS — E103599 Type 1 diabetes mellitus with proliferative diabetic retinopathy without macular edema, unspecified eye: Secondary | ICD-10-CM

## 2017-07-08 MED ORDER — PANTOPRAZOLE SODIUM 40 MG PO TBEC: 40.0000 mg | DELAYED_RELEASE_TABLET | Freq: Every day | ORAL | 3 refills | Status: DC

## 2017-07-08 MED ORDER — INSULIN PEN NEEDLE 32G X 4 MM MISC: 11 refills | Status: DC

## 2017-07-08 MED ORDER — CARVEDILOL 25 MG PO TABS: 25.0000 mg | ORAL_TABLET | Freq: Two times a day (BID) | ORAL | 3 refills | Status: DC

## 2017-07-08 MED ORDER — AMLODIPINE BESYLATE 10 MG PO TABS: 10.0000 mg | ORAL_TABLET | ORAL | 3 refills | Status: DC

## 2017-07-08 MED ORDER — BASAGLAR KWIKPEN 100 UNIT/ML ~~LOC~~ SOPN: PEN_INJECTOR | SUBCUTANEOUS | 11 refills | Status: DC

## 2017-07-08 MED ORDER — LOSARTAN POTASSIUM 100 MG PO TABS: 100.0000 mg | ORAL_TABLET | Freq: Every day | ORAL | 3 refills | Status: DC

## 2017-07-08 MED ORDER — INSULIN LISPRO 100 UNIT/ML ~~LOC~~ SOLN: SUBCUTANEOUS | 1 refills | Status: DC

## 2017-07-08 MED ORDER — EZETIMIBE 10 MG PO TABS: 10.0000 mg | ORAL_TABLET | Freq: Every day | ORAL | 3 refills | Status: DC

## 2017-07-08 NOTE — Progress Notes (Signed)
Subjective:    Patient ID: [name], female    DOB: December 19, 1975, 42 y.o.   MRN: [id number]  Medication Refill   When I last saw the patient in January, her hemoglobin A1c was outstanding at 7.1.  She has come so far from when I first met her and her A1c was greater than 13.  Unfortunately she has a history of diabetic retinopathy.  She has not seen her eye doctor in more than a year.  She is overdue for a Pap smear.  She is overdue for HIV screening.  She is also due for a diabetic foot exam.  Her blood pressure today is well controlled at 126/70.  She is on maximum dose losartan for renal protection.  She has a history of hyperlipidemia with an LDL cholesterol near 190 in January.  Unfortunately she has tried and failed numerous statins.  Starting in January she started zetia 10 mg a day and she is tolerating this well.  She is due to check a fasting lipid panel.  Diabetic foot exam is performed today and shows no abnormality other than some mild onychomycosis on her right great toenail.  Unfortunately she continues to smoke between half a pack and a pack of cigarettes a day Past Medical History:  Diagnosis Date  . Asthma   . Carpal tunnel syndrome on left   . Complication of anesthesia 07/2014   Mouth was swollen on inside, Front tooth was chipped  . Diabetes mellitus    19 years ago  . Diabetic neuropathy ([place])   . Diabetic retinopathy ([place])   . Essential hypertension 12/23/2015  . GERD (gastroesophageal reflux disease)   . Headache    Migraine- a long time ago  . Hypercholesteremia   . Hyperlipidemia 12/23/2015  . Hypertension   . Muscle spasms of lower extremity    legs and weakness  . Pneumonia   . Restless leg   . Type 1 diabetes mellitus ([place])   . Vitreous hemorrhage, left eye [facility])    Past Surgical History:  Procedure Laterality Date  . CARDIAC CATHETERIZATION N/A 12/27/2015   Procedure: Left Heart Cath and Coronary Angiography;  Surgeon: Troy Sine, MD;  Location: [facility];  Service: Cardiovascular;  Laterality: N/A;  . [place], 2002  . DILATION AND CURETTAGE OF UTERUS  1997  . GAS/FLUID EXCHANGE Right 08/28/2014   Procedure: GAS/FLUID EXCHANGE;  Surgeon: Hayden Pedro, MD;  Location: [facility];  Service: Ophthalmology;  Laterality: Right;  . LASER PHOTO ABLATION     left x 2, right x 1  . LASER PHOTO ABLATION Right 08/28/2014   Procedure: LASER PHOTO ABLATION;  Surgeon: Hayden Pedro, MD;  Location: [facility];  Service: Ophthalmology;  Laterality: Right;  . MEMBRANE PEEL Right 08/28/2014   Procedure: MEMBRANE PEEL;  Surgeon: Hayden Pedro, MD;  Location: [facility];  Service: Ophthalmology;  Laterality: Right;  . PARS PLANA VITRECTOMY Right 08/28/2014   Procedure: PARS PLANA VITRECTOMY WITH 25 GAUGE;  Surgeon: Hayden Pedro, MD;  Location: [facility];  Service: Ophthalmology;  Laterality: Right;  . PARS PLANA VITRECTOMY Left 12/24/2014   Procedure: LEFT PARS PLANA VITRECTOMY WITH 25 GAUGE WITH ENDOLASER ;  Surgeon: Hurman Horn, MD;  Location: [facility];  Service: Ophthalmology;  Laterality: Left;  . TUBAL LIGATION  2002  . WISDOM TOOTH EXTRACTION     age 71   Current Outpatient Medications on File Prior to Visit  Medication Sig Dispense Refill  . albuterol (PROAIR HFA) 108 (90 Base) MCG/ACT inhaler 2 PUFFS EVERY 6 HOURS AS NEEDED for shortness of breath 3 each 3  . albuterol (PROVENTIL) (2.5 MG/3ML) 0.083% nebulizer solution Take 3 mLs (2.5 mg total) by nebulization every 4 (four) hours as needed for wheezing or shortness of breath. 150 mL 1  . amLODipine (NORVASC) 10 MG tablet Take 1 tablet (10 mg total) by mouth See admin instructions. 90 tablet 3  . aspirin EC 81 MG tablet Take 81 mg by mouth daily.    . carvedilol (COREG) 25 MG tablet Take 1 tablet (25 mg total) by mouth 2 (two) times daily. NEED OV. 180 tablet 0  . ezetimibe (ZETIA) 10 MG tablet Take 1 tablet (10 mg total) by mouth daily. 90 tablet 3  . Insulin Glargine (BASAGLAR  KWIKPEN) 100 UNIT/ML SOPN INJECT 35 UNITS TOTAL INTO THE SKIN 2 TIMES DAILY 15 mL 11  . insulin lispro (HUMALOG) 100 UNIT/ML injection 3-8 units tid with meals (sliding scale) 10 mL 1  . Insulin Pen Needle (RELION PEN NEEDLES) 32G X 4 MM MISC USE AS DIRECTED THREE TIMES DAILY 100 each 11  . Lancets (ONETOUCH ULTRASOFT) lancets USE AS INSTRUCTED TO MONITOR BLOOD SUGAR 3 TO 4 TIMES DAILY 300 each 0  . losartan (COZAAR) 100 MG tablet Take 1 tablet (100 mg total) by mouth daily. 90 tablet 3  . ONETOUCH VERIO test strip CHECK BLOOD SUGARS 3 TO 4 TIMES DAILY 100 each 11  . OVER THE COUNTER MEDICATION Take 2 tablets by mouth at bedtime. Restless leg relief    . pantoprazole (PROTONIX) 40 MG tablet Take 1 tablet (40 mg total) by mouth daily. 90 tablet 3   No current facility-administered medications on file prior to visit.    Allergies  Allergen Reactions  . Fish-Derived Products Hives and Shortness Of Breath  . Demerol Itching and Rash  . Morphine And Related Itching and Rash       . Zocor [Simvastatin] Itching and Other (See Comments)    Leg pain   Social History   Socioeconomic History  . Marital status: Married    Spouse name: Not on file  . Number of children: Not on file  . Years of education: Not on file  . Highest education level: Not on file  Occupational History  . Not on file  Social Needs  . Financial resource strain: Not on file  . Food insecurity:    Worry: Not on file    Inability: Not on file  . Transportation needs:    Medical: Not on file    Non-medical: Not on file  Tobacco Use  . Smoking status: Current Every Day Smoker    Packs/day: 1.00    Years: 9.00    Pack years: 9.00    Types: Cigarettes  . Smokeless tobacco: Never Used  Substance and Sexual Activity  . Alcohol use: No  . Drug use: No  . Sexual activity: Yes    Partners: Male    Birth control/protection: None, Surgical    Comment: tubal   Lifestyle  . Physical activity:    Days per week: Not on  file    Minutes per session: Not on file  . Stress: Not on file  Relationships  . Social connections:    Talks on phone: Not on file    Gets together: Not on file    Attends religious service: Not on file    Active member  of club or organization: Not on file    Attends meetings of clubs or organizations: Not on file    Relationship status: Not on file  . Intimate partner violence:    Fear of current or ex partner: Not on file    Emotionally abused: Not on file    Physically abused: Not on file    Forced sexual activity: Not on file  Other Topics Concern  . Not on file  Social History Narrative   Married, lives with spouse and 2 kids   Occupation: Chemical engineer     Review of Systems  All other systems reviewed and are negative.      Objective:   Physical Exam  Constitutional: She appears well-developed and well-nourished.  Neck: Neck supple. No thyromegaly present.  Cardiovascular: Normal rate, regular rhythm, normal heart sounds and intact distal pulses.  No murmur heard. Pulmonary/Chest: Effort normal. No respiratory distress. She has no decreased breath sounds. She has no wheezes. She has no rhonchi. She has no rales.  Abdominal: Soft. Bowel sounds are normal. She exhibits no distension. There is no tenderness. There is no rebound and no guarding.  Musculoskeletal: She exhibits no edema.  Lymphadenopathy:    She has no cervical adenopathy.  Vitals reviewed.         Assessment & Plan:  Controlled diabetes mellitus type 1 with complications ([place]) - Plan: COMPLETE METABOLIC PANEL WITH GFR, Lipid panel, Microalbumin, urine, Hemoglobin A1c  Essential hypertension  Pure hypercholesterolemia  Proliferative diabetic retinopathy associated with type 1 diabetes mellitus, unspecified laterality, unspecified proliferative retinopathy type ([place]) - Plan: Ambulatory referral to Ophthalmology  I am so proud of this patient and how she is managing her blood sugars.  I  continue to praise her and how far she is calm managing her sugars and being proactive about controlling her disease.  Diabetic foot exam is normal.  I will schedule a follow-up with her ophthalmologist to monitor her retinopathy.  Her blood pressure is excellent.  I will check a microalbumin and hemoglobin A1c.  Unfortunately there are 2 outstanding issues.  Her LDL cholesterol is terrible.  I want to recheck fasting lipid panel.  If LDL cholesterol still greater than 100, I would recommend Crestor 5 mg a week in addition to the Zetia.  I also continue to encourage smoking cessation.  I recommended a Pap smear at her earliest convenience.  Patient declines HIV screening

## 2017-07-14 ENCOUNTER — Ambulatory Visit: Payer: 59 | Admitting: Family Medicine

## 2017-07-14 ENCOUNTER — Encounter: Payer: Self-pay | Admitting: Family Medicine

## 2017-07-14 ENCOUNTER — Other Ambulatory Visit: Payer: Self-pay

## 2017-07-14 VITALS — BP 124/70 | HR 83 | Temp 98.2°F | Resp 16 | Ht 63.0 in | Wt 174.2 lb

## 2017-07-14 DIAGNOSIS — Z01419 Encounter for gynecological examination (general) (routine) without abnormal findings: Secondary | ICD-10-CM

## 2017-07-14 NOTE — Progress Notes (Signed)
Patient ID: [name], female    DOB: [birth year], 42 y.o.   MRN: [id number]  PCP: Susy Frizzle, MD  Chief Complaint  Patient presents with  . Gynecologic Exam    Subjective:   [name] is a 42 y.o. female, presents to clinic for GYN/PAP.  She is [id number] female, has hx of ASCUS, but most recent PAP 09/01/2013 showed normal cytology with positive HPV16.  No OBGYN eval since then.  Pt is postmenopausal, LMP 04/2016, over one year ago.  She has no vaginal, urinary, pelvic or abdominal complaints.    Patient Active Problem List   Diagnosis Date Noted  . Carpal tunnel syndrome, bilateral 04/13/2017  . Chest pain with moderate risk for cardiac etiology   . Angina pectoris ([place])   . Essential hypertension 12/23/2015  . Hyperlipidemia 12/23/2015  . Tobacco use disorder 02/25/2015  . Vitreous hemorrhage of right eye ([place]) 08/28/2014  . Vitreous hemorrhage ([place]) 08/08/2014  . Proliferative diabetic retinopathy ([place]) 08/08/2014  . Asthma with bronchitis 12/26/2012  . Type II diabetes mellitus ([place]) 12/26/2012  . Abnormal HPV Pap smear, poor sample 01/29/2011     Prior to Admission medications   Medication Sig Start Date End Date Taking? Authorizing Provider  albuterol (PROAIR HFA) 108 (90 Base) MCG/ACT inhaler 2 PUFFS EVERY 6 HOURS AS NEEDED for shortness of breath 02/19/16  Yes Susy Frizzle, MD  albuterol (PROVENTIL) (2.5 MG/3ML) 0.083% nebulizer solution Take 3 mLs (2.5 mg total) by nebulization every 4 (four) hours as needed for wheezing or shortness of breath. 11/11/15  Yes Susy Frizzle, MD  amLODipine (NORVASC) 10 MG tablet Take 1 tablet (10 mg total) by mouth See admin instructions. 07/08/17  Yes [place], Modena Nunnery, MD  aspirin EC 81 MG tablet Take 81 mg by mouth daily.   Yes [provider]  carvedilol (COREG) 25 MG tablet Take 1 tablet (25 mg total) by mouth 2 (two) times daily. 07/08/17  Yes [place], Modena Nunnery, MD  ezetimibe (ZETIA) 10 MG tablet Take  1 tablet (10 mg total) by mouth daily. 07/08/17  Yes [place], Modena Nunnery, MD  Insulin Glargine [facility]) 100 UNIT/ML SOPN INJECT 35 UNITS TOTAL INTO THE SKIN 2 TIMES DAILY 07/08/17  Yes [place], Modena Nunnery, MD  insulin lispro (HUMALOG) 100 UNIT/ML injection 3-8 units tid with meals (sliding scale) 07/08/17  Yes [place], Modena Nunnery, MD  Insulin Pen Needle (RELION PEN NEEDLES) 32G X 4 MM MISC USE AS DIRECTED THREE TIMES DAILY 07/08/17  Yes [place], Modena Nunnery, MD  Lancets [facility] ULTRASOFT) lancets USE AS INSTRUCTED TO MONITOR BLOOD SUGAR 3 TO 4 TIMES DAILY 02/19/16  Yes Susy Frizzle, MD  losartan (COZAAR) 100 MG tablet Take 1 tablet (100 mg total) by mouth daily. 07/08/17  Yes [place], Modena Nunnery, MD  ONETOUCH VERIO test strip CHECK BLOOD SUGARS 3 TO 4 TIMES DAILY 06/09/17  Yes Susy Frizzle, MD  OVER THE COUNTER MEDICATION Take 2 tablets by mouth at bedtime. Restless leg relief   Yes [provider]  pantoprazole (PROTONIX) 40 MG tablet Take 1 tablet (40 mg total) by mouth daily. 07/08/17  Yes [place], Modena Nunnery, MD     Allergies  Allergen Reactions  . Fish-Derived Products Hives and Shortness Of Breath  . Demerol Itching and Rash  . Morphine And Related Itching and Rash       . Zocor [Simvastatin] Itching and Other (See Comments)    Leg pain  Family History  Problem Relation Age of Onset  . Diabetes Mother   . Parkinsonism Mother   . Diabetes Father   . Hypertension Father   . Diabetes Brother   . Heart failure Maternal Grandmother   . Diabetes Maternal Grandmother      Social History   Socioeconomic History  . Marital status: Married    Spouse name: Not on file  . Number of children: Not on file  . Years of education: Not on file  . Highest education level: Not on file  Occupational History  . Not on file  Social Needs  . Financial resource strain: Not on file  . Food insecurity:    Worry: Not on file    Inability: Not on file  . Transportation  needs:    Medical: Not on file    Non-medical: Not on file  Tobacco Use  . Smoking status: Current Every Day Smoker    Packs/day: 1.00    Years: 9.00    Pack years: 9.00    Types: Cigarettes  . Smokeless tobacco: Never Used  Substance and Sexual Activity  . Alcohol use: No  . Drug use: No  . Sexual activity: Yes    Partners: Male    Birth control/protection: None, Surgical    Comment: tubal   Lifestyle  . Physical activity:    Days per week: Not on file    Minutes per session: Not on file  . Stress: Not on file  Relationships  . Social connections:    Talks on phone: Not on file    Gets together: Not on file    Attends religious service: Not on file    Active member of club or organization: Not on file    Attends meetings of clubs or organizations: Not on file    Relationship status: Not on file  . Intimate partner violence:    Fear of current or ex partner: Not on file    Emotionally abused: Not on file    Physically abused: Not on file    Forced sexual activity: Not on file  Other Topics Concern  . Not on file  Social History Narrative   Married, lives with spouse and 2 kids   Occupation: Chemical engineer     Review of Systems  All other systems reviewed and are negative.      Objective:    Vitals:   07/14/17 1455  BP: 124/70  Pulse: 83  Resp: 16  Temp: 98.2 F (36.8 C)  TempSrc: Oral  SpO2: 96%  Weight: 174 lb 3.2 oz (79 kg)  Height: 5\' 3"  (1.6 m)      Physical Exam  Constitutional: She appears well-developed and well-nourished. No distress.  HENT:  Head: Normocephalic and atraumatic.  Nose: Nose normal.  Eyes: Pupils are equal, round, and reactive to light. Conjunctivae are normal. Right eye exhibits no discharge. Left eye exhibits no discharge. No scleral icterus.  Neck: Normal range of motion. Neck supple. No tracheal deviation present.  Pulmonary/Chest: Effort normal. No stridor. No respiratory distress.  Abdominal: Soft. Bowel sounds are  normal. She exhibits no distension and no mass. There is no tenderness. There is no rebound and no guarding.  Genitourinary: Pelvic exam was performed with patient prone. There is no rash, tenderness or lesion on the right labia. There is no rash, tenderness or lesion on the left labia. Uterus is not deviated, not enlarged, not fixed and not tender. Cervix exhibits no motion tenderness. Right  adnexum displays no mass, no tenderness and no fullness. Left adnexum displays no mass, no tenderness and no fullness. No erythema or tenderness in the vagina. No signs of injury around the vagina. Vaginal discharge found.  Genitourinary Comments: Mild to moderate vaginal discharge clear to white Cervix technically difficult to visualize due to retroverted uterus, no friable tissue visualized  Exam chaperoned by Vonna Kotyk, CMA PAP smear done today, thin-prep method with brush-spatula  Neurological: She is alert. She exhibits normal muscle tone. Coordination normal.  Skin: Skin is warm and dry. She is not diaphoretic. No erythema.  Psychiatric: She has a normal mood and affect. Her behavior is normal.  Nursing note and vitals reviewed.         Assessment & Plan:      ICD-10-CM   1. Encounter for gynecological examination with Papanicolaou smear of cervix Z01.419 PAP, Thin Prep w/HPV rflx HPV Type 16/18    Chart reviewed Patient has history of abnormal Pap smear in the past, also noted abnormal smear secondary inadequate sample secondary to difficult visualization of cervix (note 01/29/11 by [place]).   Labs/pathology reviewed: Colposcopy on 01/07/2011: ASCUS + HRHPV Cytology report 01/29/2011: ASCUS + HPV High Risk detected Cytology 08/28/2013: negative cytology, + HPV HR 16  Per guidelines of American Society for Colposcopy and Cervical Pathology: Women > 77 y/o cytology Neg, but HPV +, repeat cotesting @ one year, if repeated testing + for either ASC or HPV+ colposcopy.   If pt  has + cytology or high risk HPV, would need colposcopy again. If negative repeat co-testing @ 3 years   Delsa Grana, [place] 07/14/17 3:32 PM

## 2017-07-14 NOTE — Patient Instructions (Signed)
Preventive Care 18-39 Years, Female Preventive care refers to lifestyle choices and visits with your health care provider that can promote health and wellness. What does preventive care include?  A yearly physical exam. This is also called an annual well check.  Dental exams once or twice a year.  Routine eye exams. Ask your health care provider how often you should have your eyes checked.  Personal lifestyle choices, including: ? Daily care of your teeth and gums. ? Regular physical activity. ? Eating a healthy diet. ? Avoiding tobacco and drug use. ? Limiting alcohol use. ? Practicing safe sex. ? Taking vitamin and mineral supplements as recommended by your health care provider. What happens during an annual well check? The services and screenings done by your health care provider during your annual well check will depend on your age, overall health, lifestyle risk factors, and family history of disease. Counseling Your health care provider may ask you questions about your:  Alcohol use.  Tobacco use.  Drug use.  Emotional well-being.  Home and relationship well-being.  Sexual activity.  Eating habits.  Work and work Statistician.  Method of birth control.  Menstrual cycle.  Pregnancy history.  Screening You may have the following tests or measurements:  Height, weight, and BMI.  Diabetes screening. This is done by checking your blood sugar (glucose) after you have not eaten for a while (fasting).  Blood pressure.  Lipid and cholesterol levels. These may be checked every 5 years starting at age 38.  Skin check.  Hepatitis C blood test.  Hepatitis B blood test.  Sexually transmitted disease (STD) testing.  BRCA-related cancer screening. This may be done if you have a family history of breast, ovarian, tubal, or peritoneal cancers.  Pelvic exam and Pap test. This may be done every 3 years starting at age 38. Starting at age 30, this may be done  every 5 years if you have a Pap test in combination with an HPV test.  Discuss your test results, treatment options, and if necessary, the need for more tests with your health care provider. Vaccines Your health care provider may recommend certain vaccines, such as:  Influenza vaccine. This is recommended every year.  Tetanus, diphtheria, and acellular pertussis (Tdap, Td) vaccine. You may need a Td booster every 10 years.  Varicella vaccine. You may need this if you have not been vaccinated.  HPV vaccine. If you are 39 or younger, you may need three doses over 6 months.  Measles, mumps, and rubella (MMR) vaccine. You may need at least one dose of MMR. You may also need a second dose.  Pneumococcal 13-valent conjugate (PCV13) vaccine. You may need this if you have certain conditions and were not previously vaccinated.  Pneumococcal polysaccharide (PPSV23) vaccine. You may need one or two doses if you smoke cigarettes or if you have certain conditions.  Meningococcal vaccine. One dose is recommended if you are age 68-21 years and a first-year college student living in a residence hall, or if you have one of several medical conditions. You may also need additional booster doses.  Hepatitis A vaccine. You may need this if you have certain conditions or if you travel or work in places where you may be exposed to hepatitis A.  Hepatitis B vaccine. You may need this if you have certain conditions or if you travel or work in places where you may be exposed to hepatitis B.  Haemophilus influenzae type b (Hib) vaccine. You may need this  if you have certain risk factors.  Talk to your health care provider about which screenings and vaccines you need and how often you need them. This information is not intended to replace advice given to you by your health care provider. Make sure you discuss any questions you have with your health care provider. Document Released: 05/12/2001 Document Revised:  12/04/2015 Document Reviewed: 01/15/2015 Elsevier Interactive Patient Education  2018 Elsevier Inc.  

## 2017-07-16 LAB — PAP, TP IMAGING W/ HPV RNA, RFLX HPV TYPE 16,18/45: HPV DNA High Risk: NOT DETECTED

## 2017-07-19 NOTE — Progress Notes (Signed)
Negative PAP, no abnormal cells, negative HPV testing

## 2017-07-28 DIAGNOSIS — E103592 Type 1 diabetes mellitus with proliferative diabetic retinopathy without macular edema, left eye: Secondary | ICD-10-CM | POA: Diagnosis not present

## 2017-07-28 DIAGNOSIS — E103591 Type 1 diabetes mellitus with proliferative diabetic retinopathy without macular edema, right eye: Secondary | ICD-10-CM | POA: Diagnosis not present

## 2017-07-28 DIAGNOSIS — H4720 Unspecified optic atrophy: Secondary | ICD-10-CM | POA: Diagnosis not present

## 2017-07-28 LAB — HM DIABETES EYE EXAM

## 2017-08-10 ENCOUNTER — Encounter: Payer: Self-pay | Admitting: *Deleted

## 2017-10-07 ENCOUNTER — Ambulatory Visit (INDEPENDENT_AMBULATORY_CARE_PROVIDER_SITE_OTHER): Payer: 59 | Admitting: Family Medicine

## 2017-10-07 ENCOUNTER — Encounter: Payer: Self-pay | Admitting: Family Medicine

## 2017-10-07 VITALS — BP 122/70 | HR 88 | Temp 98.0°F | Resp 16 | Ht 63.0 in | Wt 176.0 lb

## 2017-10-07 DIAGNOSIS — E108 Type 1 diabetes mellitus with unspecified complications: Secondary | ICD-10-CM

## 2017-10-07 DIAGNOSIS — E7800 Pure hypercholesterolemia, unspecified: Secondary | ICD-10-CM

## 2017-10-07 DIAGNOSIS — I10 Essential (primary) hypertension: Secondary | ICD-10-CM | POA: Diagnosis not present

## 2017-10-07 NOTE — Progress Notes (Signed)
Subjective:    Patient ID: [name], female    DOB: 30-Oct-1975, 42 y.o.   MRN: [id number]  Medication Refill   07/08/17  When I last saw the patient in January, her hemoglobin A1c was outstanding at 7.1.  She has come so far from when I first met her and her A1c was greater than 13.  Unfortunately she has a history of diabetic retinopathy.  She has not seen her eye doctor in more than a year.  She is overdue for a Pap smear.  She is overdue for HIV screening.  She is also due for a diabetic foot exam.  Her blood pressure today is well controlled at 126/70.  She is on maximum dose losartan for renal protection.  She has a history of hyperlipidemia with an LDL cholesterol near 190 in January.  Unfortunately she has tried and failed numerous statins.  Starting in January she started zetia 10 mg a day and she is tolerating this well.  She is due to check a fasting lipid panel.  Diabetic foot exam is performed today and shows no abnormality other than some mild onychomycosis on her right great toenail.  Unfortunately she continues to smoke between half a pack and a pack of cigarettes a day.  At that time, my plan was: I am so proud of this patient and how she is managing her blood sugars.  I continue to praise her and how far she is calm managing her sugars and being proactive about controlling her disease.  Diabetic foot exam is normal.  I will schedule a follow-up with her ophthalmologist to monitor her retinopathy.  Her blood pressure is excellent.  I will check a microalbumin and hemoglobin A1c.  Unfortunately there are 2 outstanding issues.  Her LDL cholesterol is terrible.  I want to recheck fasting lipid panel.  If LDL cholesterol still greater than 100, I would recommend Crestor 5 mg a week in addition to the Zetia.  I also continue to encourage smoking cessation.  I recommended a Pap smear at her earliest convenience.  Patient declines HIV screening  10/07/17 Patient never return for fasting  blood work as planned.  She is here today to get that.  Therefore she never started Crestor as planned.  She suspects that her hemoglobin A1c will be higher.  The reason for this is because she is not taking her regular insulin at work as scheduled.  She frequently skips meals at work and therefore her sugar will drop and therefore she cannot take her regular insulin at work.  Also she will occasionally forget to bring her regular insulin to work.  Her fasting blood sugars are typically 120-130.  Due to occasional episodes of hypoglycemia, she has had to split her glargine into 30 units twice daily.  Hypoglycemia stems from the fact she will frequently go until 3:00 in the afternoon before eating.  She denies any chest pain shortness of breath or dyspnea on exertion.  Unfortunately she continues to smoke.  She has no desire to quit today when asked. Past Medical History:  Diagnosis Date  . Asthma   . Carpal tunnel syndrome on left   . Complication of anesthesia 07/2014   Mouth was swollen on inside, Front tooth was chipped  . Diabetes mellitus    19 years ago  . Diabetic neuropathy ([place])   . Diabetic retinopathy ([place])   . Essential hypertension 12/23/2015  . GERD (gastroesophageal reflux disease)   . Headache  Migraine- a long time ago  . Hypercholesteremia   . Hyperlipidemia 12/23/2015  . Hypertension   . Muscle spasms of lower extremity    legs and weakness  . Pneumonia   . Restless leg   . Type 1 diabetes mellitus ([place])   . Vitreous hemorrhage, left eye [facility])    Past Surgical History:  Procedure Laterality Date  . CARDIAC CATHETERIZATION N/A 12/27/2015   Procedure: Left Heart Cath and Coronary Angiography;  Surgeon: Troy Sine, MD;  Location: [place] [facility];  Service: Cardiovascular;  Laterality: N/A;  . [place], 2002  . DILATION AND CURETTAGE OF UTERUS  1997  . GAS/FLUID EXCHANGE Right 08/28/2014   Procedure: GAS/FLUID EXCHANGE;  Surgeon: Hayden Pedro,  MD;  Location: [place];  Service: Ophthalmology;  Laterality: Right;  . LASER PHOTO ABLATION     left x 2, right x 1  . LASER PHOTO ABLATION Right 08/28/2014   Procedure: LASER PHOTO ABLATION;  Surgeon: Hayden Pedro, MD;  Location: [place];  Service: Ophthalmology;  Laterality: Right;  . MEMBRANE PEEL Right 08/28/2014   Procedure: MEMBRANE PEEL;  Surgeon: Hayden Pedro, MD;  Location: [place];  Service: Ophthalmology;  Laterality: Right;  . PARS PLANA VITRECTOMY Right 08/28/2014   Procedure: PARS PLANA VITRECTOMY WITH 25 GAUGE;  Surgeon: Hayden Pedro, MD;  Location: [place];  Service: Ophthalmology;  Laterality: Right;  . PARS PLANA VITRECTOMY Left 12/24/2014   Procedure: LEFT PARS PLANA VITRECTOMY WITH 25 GAUGE WITH ENDOLASER ;  Surgeon: Hurman Horn, MD;  Location: [place];  Service: Ophthalmology;  Laterality: Left;  . TUBAL LIGATION  2002  . WISDOM TOOTH EXTRACTION     age 7   Current Outpatient Medications on File Prior to Visit  Medication Sig Dispense Refill  . albuterol (PROAIR HFA) 108 (90 Base) MCG/ACT inhaler 2 PUFFS EVERY 6 HOURS AS NEEDED for shortness of breath 3 each 3  . albuterol (PROVENTIL) (2.5 MG/3ML) 0.083% nebulizer solution Take 3 mLs (2.5 mg total) by nebulization every 4 (four) hours as needed for wheezing or shortness of breath. 150 mL 1  . amLODipine (NORVASC) 10 MG tablet Take 1 tablet (10 mg total) by mouth See admin instructions. 90 tablet 3  . aspirin EC 81 MG tablet Take 81 mg by mouth daily.    . carvedilol (COREG) 25 MG tablet Take 1 tablet (25 mg total) by mouth 2 (two) times daily. 180 tablet 3  . ezetimibe (ZETIA) 10 MG tablet Take 1 tablet (10 mg total) by mouth daily. 90 tablet 3  . Insulin Glargine (BASAGLAR KWIKPEN) 100 UNIT/ML SOPN INJECT 35 UNITS TOTAL INTO THE SKIN 2 TIMES DAILY (Patient taking differently: 30 Units 2 (two) times daily. INJECT 35 UNITS TOTAL INTO THE SKIN 2 TIMES DAILY) 15 mL 11  . insulin lispro (HUMALOG) 100 UNIT/ML injection 3-8  units tid with meals (sliding scale) 10 mL 1  . Insulin Pen Needle (RELION PEN NEEDLES) 32G X 4 MM MISC USE AS DIRECTED THREE TIMES DAILY 100 each 11  . Lancets (ONETOUCH ULTRASOFT) lancets USE AS INSTRUCTED TO MONITOR BLOOD SUGAR 3 TO 4 TIMES DAILY 300 each 0  . losartan (COZAAR) 100 MG tablet Take 1 tablet (100 mg total) by mouth daily. 90 tablet 3  . ONETOUCH VERIO test strip CHECK BLOOD SUGARS 3 TO 4 TIMES DAILY 100 each 11  . OVER THE COUNTER MEDICATION Take 2 tablets by mouth at bedtime. Restless leg  relief    . pantoprazole (PROTONIX) 40 MG tablet Take 1 tablet (40 mg total) by mouth daily. 90 tablet 3   No current facility-administered medications on file prior to visit.    Allergies  Allergen Reactions  . Fish-Derived Products Hives and Shortness Of Breath  . Demerol Itching and Rash  . Morphine And Related Itching and Rash       . Zocor [Simvastatin] Itching and Other (See Comments)    Leg pain   Social History   Socioeconomic History  . Marital status: Married    Spouse name: Not on file  . Number of children: Not on file  . Years of education: Not on file  . Highest education level: Not on file  Occupational History  . Not on file  Social Needs  . Financial resource strain: Not on file  . Food insecurity:    Worry: Not on file    Inability: Not on file  . Transportation needs:    Medical: Not on file    Non-medical: Not on file  Tobacco Use  . Smoking status: Current Every Day Smoker    Packs/day: 1.00    Years: 9.00    Pack years: 9.00    Types: Cigarettes  . Smokeless tobacco: Never Used  Substance and Sexual Activity  . Alcohol use: No  . Drug use: No  . Sexual activity: Yes    Partners: Male    Birth control/protection: None, Surgical    Comment: tubal   Lifestyle  . Physical activity:    Days per week: Not on file    Minutes per session: Not on file  . Stress: Not on file  Relationships  . Social connections:    Talks on phone: Not on file      Gets together: Not on file    Attends religious service: Not on file    Active member of club or organization: Not on file    Attends meetings of clubs or organizations: Not on file    Relationship status: Not on file  . Intimate partner violence:    Fear of current or ex partner: Not on file    Emotionally abused: Not on file    Physically abused: Not on file    Forced sexual activity: Not on file  Other Topics Concern  . Not on file  Social History Narrative   Married, lives with spouse and 2 kids   Occupation: Chemical engineer     Review of Systems  All other systems reviewed and are negative.      Objective:   Physical Exam  Constitutional: She appears well-developed and well-nourished.  Neck: Neck supple. No thyromegaly present.  Cardiovascular: Normal rate, regular rhythm, normal heart sounds and intact distal pulses.  No murmur heard. Pulmonary/Chest: Effort normal. No respiratory distress. She has no decreased breath sounds. She has no wheezes. She has no rhonchi. She has no rales.  Abdominal: Soft. Bowel sounds are normal. She exhibits no distension. There is no tenderness. There is no rebound and no guarding.  Musculoskeletal: She exhibits no edema.  Lymphadenopathy:    She has no cervical adenopathy.  Vitals reviewed.         Assessment & Plan:  Controlled diabetes mellitus type 1 with complications ([place]) - Plan: CBC with Differential/Platelet, BASIC METABOLIC PANEL WITH GFR, Lipid panel, Microalbumin, urine, Hemoglobin A1c  Essential hypertension  Pure hypercholesterolemia Random sugars are typically around 200.  I suspect her hemoglobin A1c will be  around 7.5.  I spent the majority of her visit trying to emphasize to the patient the importance of eating regular meals, not skipping meals, and using her regular insulin with meals as planned.  I explained to the patient that the insulin is unforgiving and that if she misses meals, her sugars can drop  causing episodes of hypoglycemia that are symptomatic.  I suspect that she ate 3 regular scheduled meals as planned and also snacks, she would avoid episodes of hypoglycemia would actually would see better control of her blood sugars.  Continue to encourage smoking cessation.  Check a fasting lipid panel today.  Goal LDL cholesterol is less than 100.  We will also check a urine microalbumin.  Blood pressure is at goal.

## 2017-10-08 LAB — BASIC METABOLIC PANEL WITH GFR
BUN: 18 mg/dL (ref 7–25)
CO2: 28 mmol/L (ref 20–32)
Calcium: 9.5 mg/dL (ref 8.6–10.2)
Chloride: 108 mmol/L (ref 98–110)
Creat: 0.67 mg/dL (ref 0.50–1.10)
GFR, EST AFRICAN AMERICAN: 125 mL/min/{1.73_m2} (ref >=60)
GFR, EST NON AFRICAN AMERICAN: 108 mL/min/{1.73_m2} (ref >=60)
GLUCOSE: 52 mg/dL — AB (ref 65–99)
Potassium: 4.1 mmol/L (ref 3.5–5.3)
SODIUM: 142 mmol/L (ref 135–146)

## 2017-10-08 LAB — CBC WITH DIFFERENTIAL/PLATELET
Basophils Absolute: 29 cells/uL (ref 0–200)
Basophils Relative: 0.3 %
EOS ABS: 116 {cells}/uL (ref 15–500)
Eosinophils Relative: 1.2 %
HEMATOCRIT: 43.4 % (ref 35.0–45.0)
HEMOGLOBIN: 14.9 g/dL (ref 11.7–15.5)
Lymphs Abs: 4210 cells/uL — ABNORMAL HIGH (ref 850–3900)
MCH: 30.2 pg (ref 27.0–33.0)
MCHC: 34.3 g/dL (ref 32.0–36.0)
MCV: 87.9 fL (ref 80.0–100.0)
MONOS PCT: 6.1 %
MPV: 11.1 fL (ref 7.5–12.5)
Neutro Abs: 4753 cells/uL (ref 1500–7800)
Neutrophils Relative %: 49 %
Platelets: 211 10*3/uL (ref 140–400)
RBC: 4.94 10*6/uL (ref 3.80–5.10)
RDW: 12 % (ref 11.0–15.0)
Total Lymphocyte: 43.4 %
WBC: 9.7 10*3/uL (ref 3.8–10.8)
WBCMIX: 592 {cells}/uL (ref 200–950)

## 2017-10-08 LAB — HEMOGLOBIN A1C
HEMOGLOBIN A1C: 6.9 %{Hb} — AB (ref <5.7)
MEAN PLASMA GLUCOSE: 151 (calc)
eAG (mmol/L): 8.4 (calc)

## 2017-10-08 LAB — LIPID PANEL
Cholesterol: 215 mg/dL — ABNORMAL HIGH (ref <200)
HDL: 33 mg/dL — ABNORMAL LOW (ref >50)
LDL Cholesterol (Calc): 164 mg/dL (calc) — ABNORMAL HIGH
NON-HDL CHOLESTEROL (CALC): 182 mg/dL — AB (ref <130)
Total CHOL/HDL Ratio: 6.5 (calc) — ABNORMAL HIGH (ref <5.0)
Triglycerides: 77 mg/dL (ref <150)

## 2017-10-08 LAB — MICROALBUMIN, URINE: Microalb, Ur: 0.6 mg/dL

## 2017-11-09 ENCOUNTER — Other Ambulatory Visit: Payer: Self-pay | Admitting: Family Medicine

## 2017-11-09 MED ORDER — EVOLOCUMAB 140 MG/ML ~~LOC~~ SOAJ: 140.0000 mg | SUBCUTANEOUS | 3 refills | Status: DC

## 2017-11-11 ENCOUNTER — Telehealth: Payer: Self-pay | Admitting: Family Medicine

## 2017-11-11 NOTE — Telephone Encounter (Signed)
PA Submitted through [email or web address] and received the following:  [employer] is reviewing your PA request. Typically an electronic response will be received within 72 hours. To check for an update later, open this request from your dashboard.

## 2017-11-12 NOTE — Telephone Encounter (Signed)
Denied -- will send appeal letter

## 2017-11-17 ENCOUNTER — Encounter: Payer: Self-pay | Admitting: Family Medicine

## 2017-11-17 ENCOUNTER — Other Ambulatory Visit: Payer: Self-pay | Admitting: Family Medicine

## 2017-12-09 NOTE — Telephone Encounter (Signed)
Appeal letter was denied as well for the [place].

## 2017-12-13 NOTE — Telephone Encounter (Signed)
Please notify patient about the denial.  Would she be willing to try crestor just 5 mg a day and see if she can tolerate the absolute lowest dose.

## 2017-12-15 NOTE — Telephone Encounter (Signed)
She can not tolerate any statin - she did even try taking it just 3 times per week and that still give her severe myalgias.

## 2017-12-16 NOTE — Telephone Encounter (Signed)
Ok, only option would be zetia 10 mg a day.

## 2018-01-06 ENCOUNTER — Ambulatory Visit: Payer: Self-pay | Admitting: Family Medicine

## 2018-01-13 ENCOUNTER — Ambulatory Visit (INDEPENDENT_AMBULATORY_CARE_PROVIDER_SITE_OTHER): Payer: 59 | Admitting: Family Medicine

## 2018-01-13 ENCOUNTER — Encounter: Payer: Self-pay | Admitting: Family Medicine

## 2018-01-13 VITALS — BP 128/80 | HR 88 | Temp 97.9°F | Resp 16 | Ht 63.0 in | Wt 172.0 lb

## 2018-01-13 DIAGNOSIS — J441 Chronic obstructive pulmonary disease with (acute) exacerbation: Secondary | ICD-10-CM | POA: Diagnosis not present

## 2018-01-13 DIAGNOSIS — I10 Essential (primary) hypertension: Secondary | ICD-10-CM | POA: Diagnosis not present

## 2018-01-13 DIAGNOSIS — E109 Type 1 diabetes mellitus without complications: Secondary | ICD-10-CM | POA: Diagnosis not present

## 2018-01-13 DIAGNOSIS — E7800 Pure hypercholesterolemia, unspecified: Secondary | ICD-10-CM

## 2018-01-13 MED ORDER — ONETOUCH VERIO W/DEVICE KIT: PACK | 0 refills | Status: DC

## 2018-01-13 MED ORDER — AZITHROMYCIN 250 MG PO TABS: ORAL_TABLET | ORAL | 0 refills | Status: DC

## 2018-01-13 MED ORDER — GLUCOSE BLOOD VI STRP: ORAL_STRIP | 12 refills | Status: DC

## 2018-01-13 MED ORDER — PREDNISONE 20 MG PO TABS: ORAL_TABLET | ORAL | 0 refills | Status: DC

## 2018-01-13 NOTE — Addendum Note (Signed)
Addended by: Shary Decamp B on: 01/13/2018 03:26 PM   Modules accepted: Orders

## 2018-01-13 NOTE — Telephone Encounter (Signed)
Pt on Zetia

## 2018-01-13 NOTE — Progress Notes (Signed)
Subjective:    Patient ID: [name], female    DOB: 07/24/1975, 42 y.o.   MRN: [id number]  Medication Refill   07/08/17  When I last saw the patient in January, her hemoglobin A1c was outstanding at 7.1.  She has come so far from when I first met her and her A1c was greater than 13.  Unfortunately she has a history of diabetic retinopathy.  She has not seen her eye doctor in more than a year.  She is overdue for a Pap smear.  She is overdue for HIV screening.  She is also due for a diabetic foot exam.  Her blood pressure today is well controlled at 126/70.  She is on maximum dose losartan for renal protection.  She has a history of hyperlipidemia with an LDL cholesterol near 190 in January.  Unfortunately she has tried and failed numerous statins.  Starting in January she started zetia 10 mg a day and she is tolerating this well.  She is due to check a fasting lipid panel.  Diabetic foot exam is performed today and shows no abnormality other than some mild onychomycosis on her right great toenail.  Unfortunately she continues to smoke between half a pack and a pack of cigarettes a day.  At that time, my plan was: I am so proud of this patient and how she is managing her blood sugars.  I continue to praise her and how far she is calm managing her sugars and being proactive about controlling her disease.  Diabetic foot exam is normal.  I will schedule a follow-up with her ophthalmologist to monitor her retinopathy.  Her blood pressure is excellent.  I will check a microalbumin and hemoglobin A1c.  Unfortunately there are 2 outstanding issues.  Her LDL cholesterol is terrible.  I want to recheck fasting lipid panel.  If LDL cholesterol still greater than 100, I would recommend Crestor 5 mg a week in addition to the Zetia.  I also continue to encourage smoking cessation.  I recommended a Pap smear at her earliest convenience.  Patient declines HIV screening  10/07/17 Patient never return for fasting  blood work as planned.  She is here today to get that.  Therefore she never started Crestor as planned.  She suspects that her hemoglobin A1c will be higher.  The reason for this is because she is not taking her regular insulin at work as scheduled.  She frequently skips meals at work and therefore her sugar will drop and therefore she cannot take her regular insulin at work.  Also she will occasionally forget to bring her regular insulin to work.  Her fasting blood sugars are typically 120-130.  Due to occasional episodes of hypoglycemia, she has had to split her glargine into 30 units twice daily.  Hypoglycemia stems from the fact she will frequently go until 3:00 in the afternoon before eating.  She denies any chest pain shortness of breath or dyspnea on exertion.  Unfortunately she continues to smoke.  She has no desire to quit today when asked.  At that time, my plan was: Random sugars are typically around 200.  I suspect her hemoglobin A1c will be around 7.5.  I spent the majority of her visit trying to emphasize to the patient the importance of eating regular meals, not skipping meals, and using her regular insulin with meals as planned.  I explained to the patient that the insulin is unforgiving and that if she misses meals, her sugars  can drop causing episodes of hypoglycemia that are symptomatic.  I suspect that she ate 3 regular scheduled meals as planned and also snacks, she would avoid episodes of hypoglycemia would actually would see better control of her blood sugars.  Continue to encourage smoking cessation.  Check a fasting lipid panel today.  Goal LDL cholesterol is less than 100.  We will also check a urine microalbumin.  Blood pressure is at goal.  01/13/18 Patient is here today to recheck her diabetes.  She denies any polyuria, polydipsia, or blurry vision.  She states that her blood sugars are typically between 100-120 in the mornings.  She seldom sees abnormal blood sugars with highs she  is seen in the mornings have been 160.  Random sugars in the evening are typically less than 200.  However recently she has been sick.  States for the last month she has had a cough.  She is smoking about half a pack of cigarettes a day.  She states that for 3 or 4 days she will cough, wheeze, she will start Mucinex and albuterol and the cough will improve after 3 days.  She will then discontinue the albuterol and the Mucinex and the cough will return.  This cycle has repeated 3 times over the last 3 to 4 weeks and most recently the cough is productive of brown sputum.  Her today on her exam, her lungs are completely clear to auscultation bilaterally.  There is no wheezes crackles or rails.  Blood pressures well controlled at 128/80.  She denies any chest pain or shortness of breath or dyspnea on exertion.  She continues to avoid taking statins due to statin intolerance.  She is on Zetia Past Medical History:  Diagnosis Date  . Asthma   . Carpal tunnel syndrome on left   . Complication of anesthesia 07/2014   Mouth was swollen on inside, Front tooth was chipped  . Diabetes mellitus    19 years ago  . Diabetic neuropathy ([place])   . Diabetic retinopathy ([place])   . Essential hypertension 12/23/2015  . GERD (gastroesophageal reflux disease)   . Headache    Migraine- a long time ago  . Hypercholesteremia   . Hyperlipidemia 12/23/2015  . Hypertension   . Muscle spasms of lower extremity    legs and weakness  . Pneumonia   . Restless leg   . Type 1 diabetes mellitus ([place])   . Vitreous hemorrhage, left eye [facility])    Past Surgical History:  Procedure Laterality Date  . CARDIAC CATHETERIZATION N/A 12/27/2015   Procedure: Left Heart Cath and Coronary Angiography;  Surgeon: Troy Sine, MD;  Location: [place] [facility];  Service: Cardiovascular;  Laterality: N/A;  . [place], 2002  . DILATION AND CURETTAGE OF UTERUS  1997  . GAS/FLUID EXCHANGE Right 08/28/2014   Procedure:  GAS/FLUID EXCHANGE;  Surgeon: Hayden Pedro, MD;  Location: [place];  Service: Ophthalmology;  Laterality: Right;  . LASER PHOTO ABLATION     left x 2, right x 1  . LASER PHOTO ABLATION Right 08/28/2014   Procedure: LASER PHOTO ABLATION;  Surgeon: Hayden Pedro, MD;  Location: [place];  Service: Ophthalmology;  Laterality: Right;  . MEMBRANE PEEL Right 08/28/2014   Procedure: MEMBRANE PEEL;  Surgeon: Hayden Pedro, MD;  Location: [place];  Service: Ophthalmology;  Laterality: Right;  . PARS PLANA VITRECTOMY Right 08/28/2014   Procedure: PARS PLANA VITRECTOMY WITH 25 GAUGE;  Surgeon: Jenny Reichmann  Eber Jones, MD;  Location: [place];  Service: Ophthalmology;  Laterality: Right;  . PARS PLANA VITRECTOMY Left 12/24/2014   Procedure: LEFT PARS PLANA VITRECTOMY WITH 25 GAUGE WITH ENDOLASER ;  Surgeon: Hurman Horn, MD;  Location: [place];  Service: Ophthalmology;  Laterality: Left;  . TUBAL LIGATION  2002  . WISDOM TOOTH EXTRACTION     age 38   Current Outpatient Medications on File Prior to Visit  Medication Sig Dispense Refill  . albuterol (PROAIR HFA) 108 (90 Base) MCG/ACT inhaler 2 PUFFS EVERY 6 HOURS AS NEEDED for shortness of breath 3 each 3  . albuterol (PROVENTIL) (2.5 MG/3ML) 0.083% nebulizer solution Take 3 mLs (2.5 mg total) by nebulization every 4 (four) hours as needed for wheezing or shortness of breath. 150 mL 1  . amLODipine (NORVASC) 10 MG tablet Take 1 tablet (10 mg total) by mouth See admin instructions. 90 tablet 3  . aspirin EC 81 MG tablet Take 81 mg by mouth daily.    . carvedilol (COREG) 25 MG tablet Take 1 tablet (25 mg total) by mouth 2 (two) times daily. 180 tablet 3  . ezetimibe (ZETIA) 10 MG tablet Take 1 tablet (10 mg total) by mouth daily. 90 tablet 3  . Insulin Glargine (BASAGLAR KWIKPEN) 100 UNIT/ML SOPN INJECT 35 UNITS TOTAL INTO THE SKIN 2 TIMES DAILY (Patient taking differently: 30 Units 2 (two) times daily. INJECT 35 UNITS TOTAL INTO THE SKIN 2 TIMES DAILY) 15 mL 11  . insulin  lispro (HUMALOG) 100 UNIT/ML injection INJECT 3 TO 8 UNITS SUBCUTANEOUSLY THREE TIMES DAILY WITH MEALS (SLIDING SCALE) 10 vial 1  . Insulin Pen Needle (RELION PEN NEEDLES) 32G X 4 MM MISC USE AS DIRECTED THREE TIMES DAILY 100 each 11  . Lancets (ONETOUCH ULTRASOFT) lancets USE AS INSTRUCTED TO MONITOR BLOOD SUGAR 3 TO 4 TIMES DAILY 300 each 0  . losartan (COZAAR) 100 MG tablet Take 1 tablet (100 mg total) by mouth daily. 90 tablet 3  . ONETOUCH VERIO test strip CHECK BLOOD SUGARS 3 TO 4 TIMES DAILY 100 each 11  . OVER THE COUNTER MEDICATION Take 2 tablets by mouth at bedtime. Restless leg relief    . pantoprazole (PROTONIX) 40 MG tablet Take 1 tablet (40 mg total) by mouth daily. 90 tablet 3   No current facility-administered medications on file prior to visit.    Allergies  Allergen Reactions  . Fish-Derived Products Hives and Shortness Of Breath  . Demerol Itching and Rash  . Morphine And Related Itching and Rash       . Zocor [Simvastatin] Itching and Other (See Comments)    Leg pain   Social History   Socioeconomic History  . Marital status: Married    Spouse name: Not on file  . Number of children: Not on file  . Years of education: Not on file  . Highest education level: Not on file  Occupational History  . Not on file  Social Needs  . Financial resource strain: Not on file  . Food insecurity:    Worry: Not on file    Inability: Not on file  . Transportation needs:    Medical: Not on file    Non-medical: Not on file  Tobacco Use  . Smoking status: Current Every Day Smoker    Packs/day: 1.00    Years: 9.00    Pack years: 9.00    Types: Cigarettes  . Smokeless tobacco: Never Used  Substance and Sexual Activity  .  Alcohol use: No  . Drug use: No  . Sexual activity: Yes    Partners: Male    Birth control/protection: None, Surgical    Comment: tubal   Lifestyle  . Physical activity:    Days per week: Not on file    Minutes per session: Not on file  . Stress:  Not on file  Relationships  . Social connections:    Talks on phone: Not on file    Gets together: Not on file    Attends religious service: Not on file    Active member of club or organization: Not on file    Attends meetings of clubs or organizations: Not on file    Relationship status: Not on file  . Intimate partner violence:    Fear of current or ex partner: Not on file    Emotionally abused: Not on file    Physically abused: Not on file    Forced sexual activity: Not on file  Other Topics Concern  . Not on file  Social History Narrative   Married, lives with spouse and 2 kids   Occupation: Chemical engineer     Review of Systems  All other systems reviewed and are negative.      Objective:   Physical Exam  Constitutional: She appears well-developed and well-nourished.  Neck: Neck supple. No thyromegaly present.  Cardiovascular: Normal rate, regular rhythm, normal heart sounds and intact distal pulses.  No murmur heard. Pulmonary/Chest: Effort normal. No respiratory distress. She has no decreased breath sounds. She has no wheezes. She has no rhonchi. She has no rales.  Abdominal: Soft. Bowel sounds are normal. She exhibits no distension. There is no tenderness. There is no rebound and no guarding.  Musculoskeletal: She exhibits no edema.  Lymphadenopathy:    She has no cervical adenopathy.  Vitals reviewed.         Assessment & Plan:  Diabetes mellitus type 1, controlled, insulin dependent ([place]) - Plan: Hemoglobin A1c, CBC with Differential/Platelet, COMPLETE METABOLIC PANEL WITH GFR, Lipid panel, Microalbumin, urine  Essential hypertension  Pure hypercholesterolemia  COPD exacerbation (H[facility])  Her blood pressure is well controlled.  I will make no changes in her medications.  I will check a hemoglobin A1c.  Ideally I like to see this below 7.  I will also check a urine microalbumin to evaluate for nephropathy.  I will check a fasting lipid panel.  Ideally I  like her LDL cholesterol to believe below 100 however the patient is unable to tolerate any statin and cannot afford PSK 9 inhibitors.  I believe she has a mild COPD exacerbation.  We are going to start her on Symbicort 160/4.52 puffs inhaled twice a day to see if this will help alleviate her symptoms.  However if the symptoms persist, we will step up to a prednisone taper pack with a Z-Pak.

## 2018-01-14 LAB — HEMOGLOBIN A1C
EAG (MMOL/L): 9.8 (calc)
Hgb A1c MFr Bld: 7.8 % of total Hgb — ABNORMAL HIGH (ref <5.7)
MEAN PLASMA GLUCOSE: 177 (calc)

## 2018-01-14 LAB — CBC WITH DIFFERENTIAL/PLATELET
BASOS ABS: 22 {cells}/uL (ref 0–200)
BASOS PCT: 0.3 %
Eosinophils Absolute: 88 cells/uL (ref 15–500)
Eosinophils Relative: 1.2 %
HEMATOCRIT: 43.7 % (ref 35.0–45.0)
HEMOGLOBIN: 15 g/dL (ref 11.7–15.5)
LYMPHS ABS: 3424 {cells}/uL (ref 850–3900)
MCH: 30.1 pg (ref 27.0–33.0)
MCHC: 34.3 g/dL (ref 32.0–36.0)
MCV: 87.6 fL (ref 80.0–100.0)
MPV: 11.9 fL (ref 7.5–12.5)
Monocytes Relative: 6.2 %
NEUTROS ABS: 3314 {cells}/uL (ref 1500–7800)
Neutrophils Relative %: 45.4 %
Platelets: 176 10*3/uL (ref 140–400)
RBC: 4.99 10*6/uL (ref 3.80–5.10)
RDW: 12.2 % (ref 11.0–15.0)
Total Lymphocyte: 46.9 %
WBC: 7.3 10*3/uL (ref 3.8–10.8)
WBCMIX: 453 {cells}/uL (ref 200–950)

## 2018-01-14 LAB — COMPLETE METABOLIC PANEL WITH GFR
AG Ratio: 1.7 (calc) (ref 1.0–2.5)
ALBUMIN MSPROF: 4.4 g/dL (ref 3.6–5.1)
ALKALINE PHOSPHATASE (APISO): 66 U/L (ref 33–115)
ALT: 25 U/L (ref 6–29)
AST: 19 U/L (ref 10–30)
BILIRUBIN TOTAL: 0.7 mg/dL (ref 0.2–1.2)
BUN: 13 mg/dL (ref 7–25)
CO2: 29 mmol/L (ref 20–32)
CREATININE: 0.64 mg/dL (ref 0.50–1.10)
Calcium: 9.7 mg/dL (ref 8.6–10.2)
Chloride: 104 mmol/L (ref 98–110)
GFR, Est African American: 126 mL/min/{1.73_m2} (ref >=60)
GFR, Est Non African American: 109 mL/min/{1.73_m2} (ref >=60)
GLOBULIN: 2.6 g/dL (ref 1.9–3.7)
Glucose, Bld: 130 mg/dL — ABNORMAL HIGH (ref 65–99)
Potassium: 4.6 mmol/L (ref 3.5–5.3)
SODIUM: 139 mmol/L (ref 135–146)
Total Protein: 7 g/dL (ref 6.1–8.1)

## 2018-01-14 LAB — LIPID PANEL
CHOL/HDL RATIO: 8 (calc) — AB (ref <5.0)
CHOLESTEROL: 239 mg/dL — AB (ref <200)
HDL: 30 mg/dL — ABNORMAL LOW (ref >50)
LDL CHOLESTEROL (CALC): 181 mg/dL — AB
Non-HDL Cholesterol (Calc): 209 mg/dL (calc) — ABNORMAL HIGH (ref <130)
Triglycerides: 142 mg/dL (ref <150)

## 2018-01-15 IMAGING — DX DG CHEST 2V
2 series · 2 of 2 positions shown · non-contrast
Comparison: 06/16/2010.

CLINICAL DATA: Shortness of breath.

EXAM:
CHEST  2 VIEW

[chest pa]
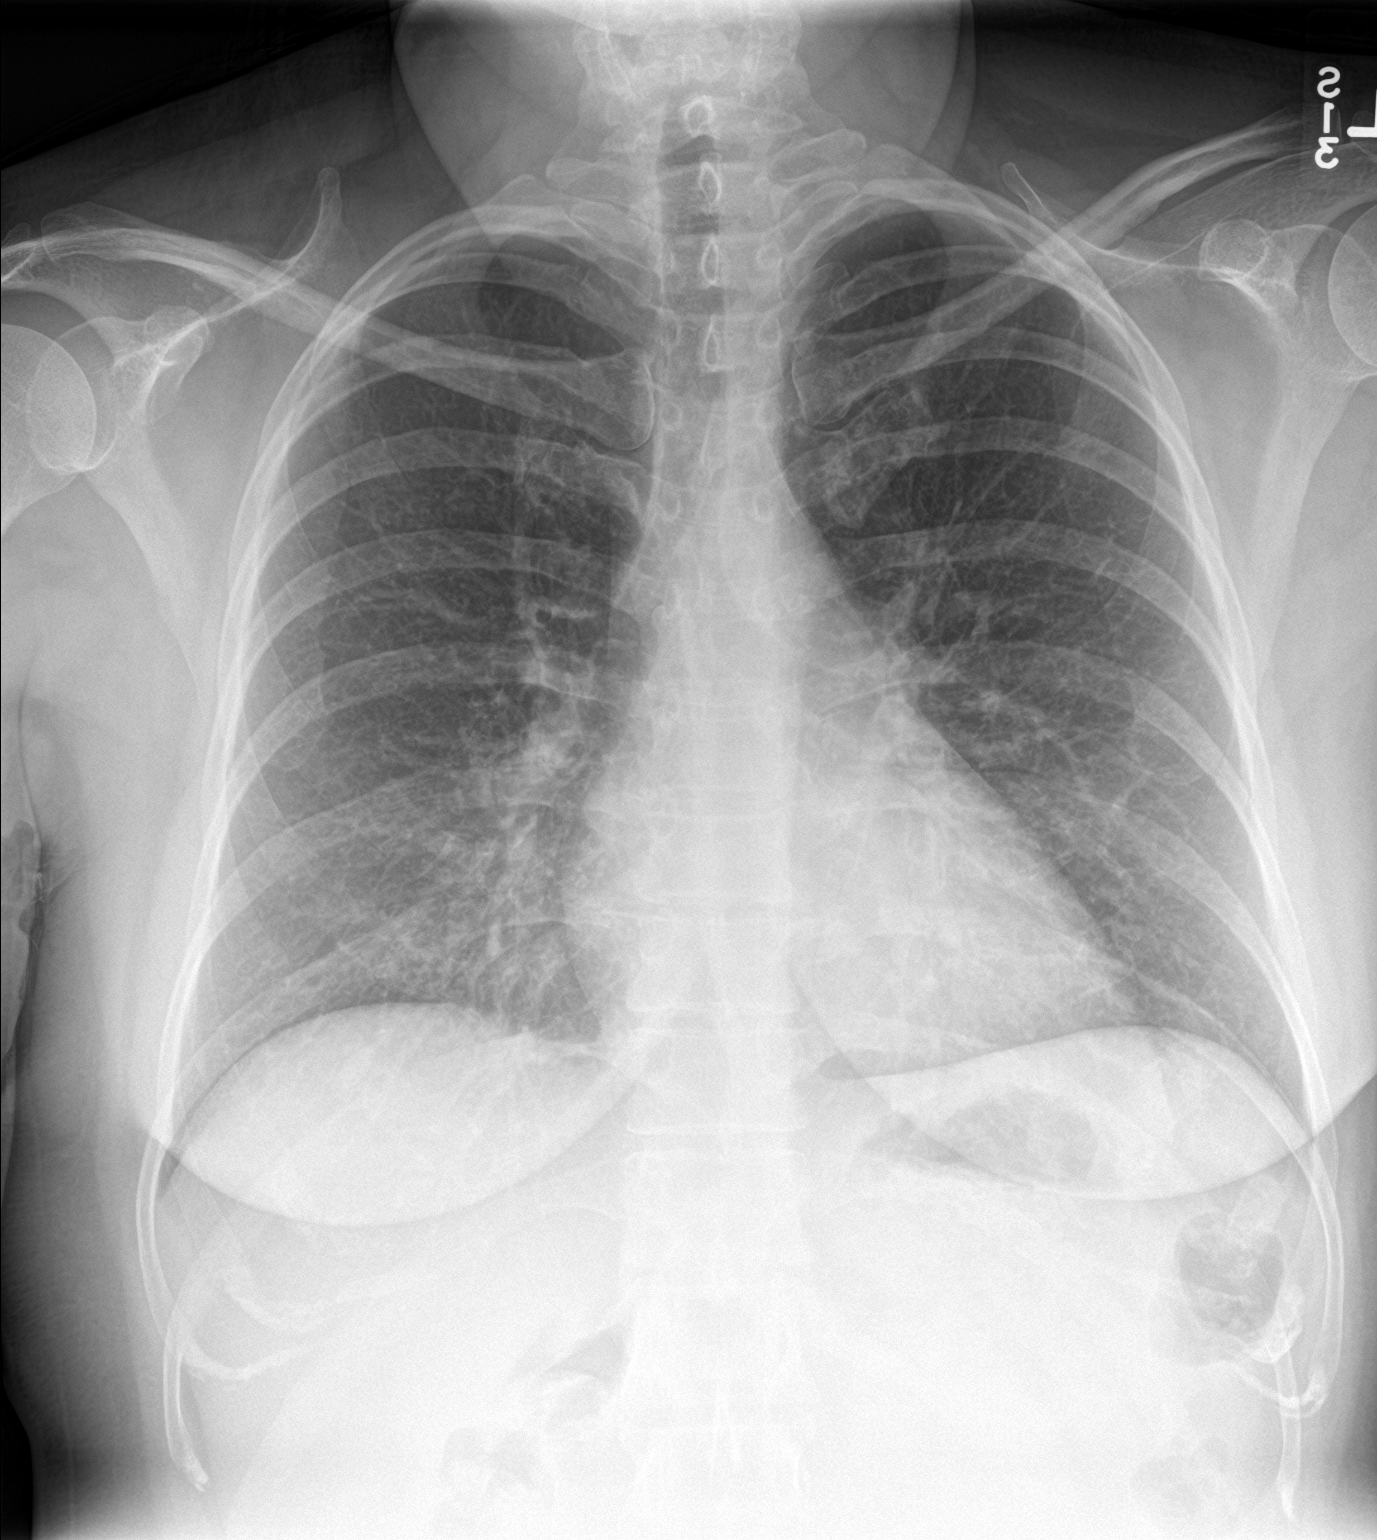

[chest lat]
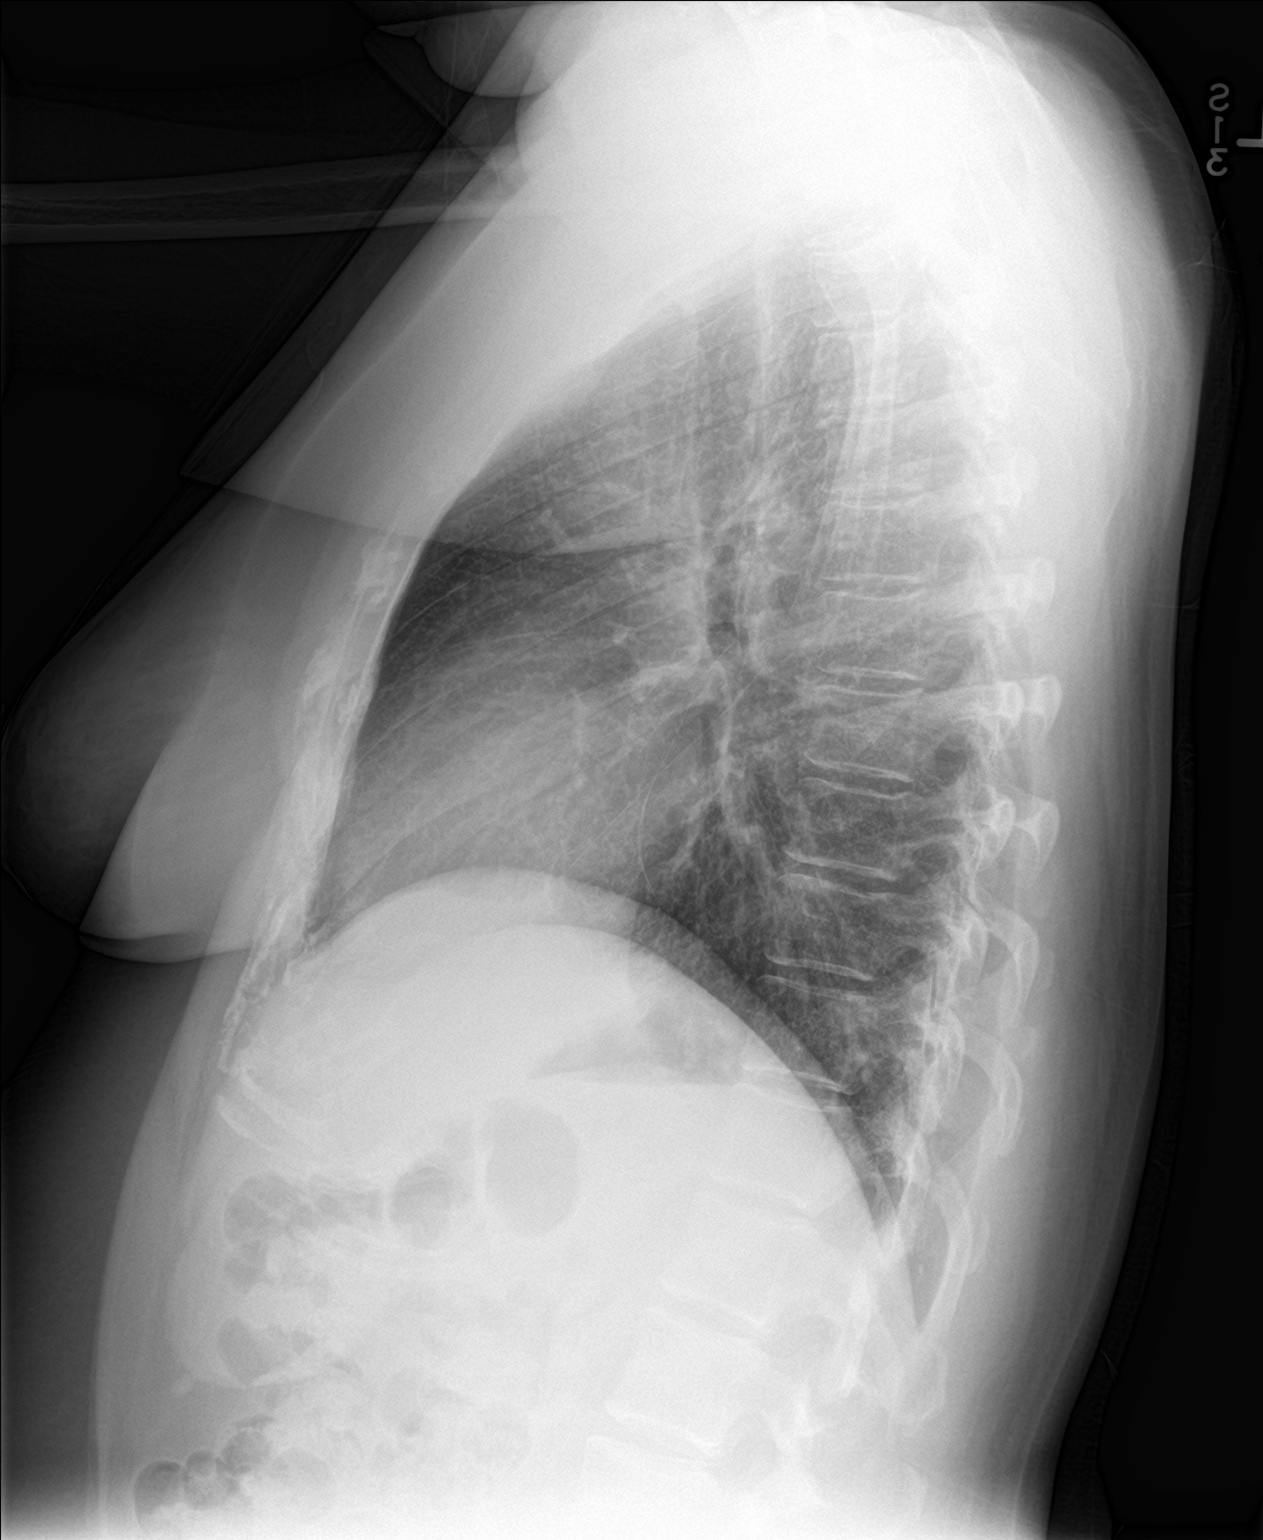

[2 of 2 positions shown; findings below may reference images not displayed]

FINDINGS: Mediastinum hilar structures normal. Heart size stable. No increased
markings in the right lung base. Mild infiltrate and/or
bronchiectasis cannot be excluded. No pleural effusion or
pneumothorax. No acute bony abnormality .
IMPRESSION: Mild infiltrate and/or bronchiectasis right lung base cannot be
excluded.

## 2018-04-12 ENCOUNTER — Other Ambulatory Visit: Payer: Self-pay | Admitting: Family Medicine

## 2018-04-13 DIAGNOSIS — R05 Cough: Secondary | ICD-10-CM | POA: Diagnosis not present

## 2018-04-13 DIAGNOSIS — J209 Acute bronchitis, unspecified: Secondary | ICD-10-CM | POA: Diagnosis not present

## 2018-04-21 DIAGNOSIS — J209 Acute bronchitis, unspecified: Secondary | ICD-10-CM | POA: Diagnosis not present

## 2018-04-21 DIAGNOSIS — J4541 Moderate persistent asthma with (acute) exacerbation: Secondary | ICD-10-CM | POA: Diagnosis not present

## 2018-04-21 DIAGNOSIS — R05 Cough: Secondary | ICD-10-CM | POA: Diagnosis not present

## 2018-06-17 ENCOUNTER — Other Ambulatory Visit: Payer: Self-pay | Admitting: Family Medicine

## 2018-07-31 ENCOUNTER — Other Ambulatory Visit: Payer: Self-pay | Admitting: Family Medicine

## 2018-09-06 ENCOUNTER — Other Ambulatory Visit: Payer: Self-pay | Admitting: Family Medicine

## 2018-10-11 ENCOUNTER — Other Ambulatory Visit: Payer: Self-pay | Admitting: Family Medicine

## 2018-11-14 ENCOUNTER — Other Ambulatory Visit: Payer: Self-pay | Admitting: Family Medicine

## 2018-12-27 ENCOUNTER — Other Ambulatory Visit: Payer: Self-pay | Admitting: Family Medicine

## 2019-01-24 ENCOUNTER — Other Ambulatory Visit: Payer: Self-pay

## 2019-01-24 ENCOUNTER — Encounter: Payer: Self-pay | Admitting: Family Medicine

## 2019-01-24 ENCOUNTER — Ambulatory Visit (INDEPENDENT_AMBULATORY_CARE_PROVIDER_SITE_OTHER): Payer: 59 | Admitting: Family Medicine

## 2019-01-24 VITALS — BP 118/78 | HR 92 | Temp 98.2°F | Resp 16 | Ht 63.0 in | Wt 165.0 lb

## 2019-01-24 DIAGNOSIS — E109 Type 1 diabetes mellitus without complications: Secondary | ICD-10-CM

## 2019-01-24 DIAGNOSIS — I10 Essential (primary) hypertension: Secondary | ICD-10-CM

## 2019-01-24 DIAGNOSIS — E7800 Pure hypercholesterolemia, unspecified: Secondary | ICD-10-CM

## 2019-01-24 MED ORDER — FLUCONAZOLE 150 MG PO TABS: 150.0000 mg | ORAL_TABLET | Freq: Once | ORAL | 1 refills | Status: AC

## 2019-01-24 NOTE — Progress Notes (Signed)
Subjective:    Patient ID: [name], female    DOB: May 15, 1975, 43 y.o.   MRN: [id number]  Patient is here today for a checkup.  Since I last saw the patient, her insurance stopped covering the [employer].  She was previously on 35 units a day of this in addition to fast acting insulin which she takes approximately 5 units twice a day.  Therefore she was on a total supply of 45 units in a 24-hour.  And this was managing her sugars well.  However over the last several months she has not been taking the [employer].  Instead she has been trying to manage herself with rapid acting insulin 3 times a day and having very little success.  She states that her sugars are high, greater than 200 and she anticipates that her A1c will be out of control.  She is also smoking and she is smoking half a pack of cigarettes a day.  She is now staying home from work caring for her son who has developmental delay.  This is due to the fact she was laid off from her job with the COVID-19 pandemic.  She denies any chest pain shortness of breath or dyspnea on exertion.  She is overdue for diabetic eye exam.  Diabetic foot exam was performed today and is normal.  She denies any neuropathy in her feet. Past Medical History:  Diagnosis Date  . Asthma   . Carpal tunnel syndrome on left   . Complication of anesthesia 07/2014   Mouth was swollen on inside, Front tooth was chipped  . Diabetes mellitus    19 years ago  . Diabetic neuropathy ([place])   . Diabetic retinopathy ([place])   . Essential hypertension 12/23/2015  . GERD (gastroesophageal reflux disease)   . Headache    Migraine- a long time ago  . Hypercholesteremia   . Hyperlipidemia 12/23/2015  . Hypertension   . Muscle spasms of lower extremity    legs and weakness  . Pneumonia   . Restless leg   . Type 1 diabetes mellitus ([place])   . Vitreous hemorrhage, left eye [facility])    Past Surgical History:  Procedure Laterality Date  . CARDIAC CATHETERIZATION N/A 12/27/2015   Procedure: Left Heart Cath and Coronary Angiography;  Surgeon: Troy Sine, MD;  Location: [facility];  Service: Cardiovascular;  Laterality: N/A;  . [place], 2002  . DILATION AND CURETTAGE OF UTERUS  1997  . GAS/FLUID EXCHANGE Right 08/28/2014   Procedure: GAS/FLUID EXCHANGE;  Surgeon: Hayden Pedro, MD;  Location: [place];  Service: Ophthalmology;  Laterality: Right;  . LASER PHOTO ABLATION     left x 2, right x 1  . LASER PHOTO ABLATION Right 08/28/2014   Procedure: LASER PHOTO ABLATION;  Surgeon: Hayden Pedro, MD;  Location: [place];  Service: Ophthalmology;  Laterality: Right;  . MEMBRANE PEEL Right 08/28/2014   Procedure: MEMBRANE PEEL;  Surgeon: Hayden Pedro, MD;  Location: [place];  Service: Ophthalmology;  Laterality: Right;  . PARS PLANA VITRECTOMY Right 08/28/2014   Procedure: PARS PLANA VITRECTOMY WITH 25 GAUGE;  Surgeon: Hayden Pedro, MD;  Location: [place];  Service: Ophthalmology;  Laterality: Right;  . PARS PLANA VITRECTOMY Left 12/24/2014   Procedure: LEFT PARS PLANA VITRECTOMY WITH 25 GAUGE WITH ENDOLASER ;  Surgeon: Hurman Horn, MD;  Location: [place];  Service: Ophthalmology;  Laterality: Left;  . TUBAL LIGATION  2002  .  WISDOM TOOTH EXTRACTION     age 38   Current Outpatient Medications on File Prior to Visit  Medication Sig Dispense Refill  . albuterol (PROAIR HFA) 108 (90 Base) MCG/ACT inhaler 2 PUFFS EVERY 6 HOURS AS NEEDED for shortness of breath 3 each 3  . albuterol (PROVENTIL) (2.5 MG/3ML) 0.083% nebulizer solution Take 3 mLs (2.5 mg total) by nebulization every 4 (four) hours as needed for wheezing or shortness of breath. 150 mL 1  . amLODipine (NORVASC) 10 MG tablet TAKE 1 TABLET BY MOUTH ONCE DAILY . APPOINTMENT REQUIRED FOR FUTURE REFILLS 30 tablet 0  . aspirin EC 81 MG tablet Take 81 mg by mouth daily.    . Blood Glucose Monitoring Suppl (ONETOUCH VERIO) w/Device KIT check bs bid 1 kit 0  . carvedilol (COREG) 25 MG tablet Take 1  tablet by mouth twice daily 60 tablet 0  . ezetimibe (ZETIA) 10 MG tablet Take 1 tablet (10 mg total) by mouth daily. 90 tablet 3  . glucose blood (ONETOUCH VERIO) test strip Use as instructed 100 each 12  . Insulin Glargine (BASAGLAR KWIKPEN) 100 UNIT/ML SOPN INJECT 35 UNITS SUBCUTANEOUSLY TWICE DAILY 15 mL 0  . insulin lispro (HUMALOG) 100 UNIT/ML injection INJECT 3 TO 8 UNITS SUBCUTANEOUSLY THREE TIMES DAILY WITH MEALS 10 mL 2  . Insulin Pen Needle (RELION PEN NEEDLES) 32G X 4 MM MISC USE AS DIRECTED THREE TIMES DAILY 100 each 11  . Lancets (ONETOUCH ULTRASOFT) lancets USE AS INSTRUCTED TO MONITOR BLOOD SUGAR 3 TO 4 TIMES DAILY 300 each 0  . losartan (COZAAR) 100 MG tablet TAKE 1 TABLET BY MOUTH ONCE DAILY . APPOINTMENT REQUIRED FOR FUTURE REFILLS 30 tablet 0  . OVER THE COUNTER MEDICATION Take 2 tablets by mouth at bedtime. Restless leg relief    . pantoprazole (PROTONIX) 40 MG tablet Take 1 tablet by mouth once daily 30 tablet 0   No current facility-administered medications on file prior to visit.    Allergies  Allergen Reactions  . Fish-Derived Products Hives and Shortness Of Breath  . Demerol Itching and Rash  . Morphine And Related Itching and Rash       . Zocor [Simvastatin] Itching and Other (See Comments)    Leg pain   Social History   Socioeconomic History  . Marital status: Married    Spouse name: Not on file  . Number of children: Not on file  . Years of education: Not on file  . Highest education level: Not on file  Occupational History  . Not on file  Social Needs  . Financial resource strain: Not on file  . Food insecurity    Worry: Not on file    Inability: Not on file  . Transportation needs    Medical: Not on file    Non-medical: Not on file  Tobacco Use  . Smoking status: Current Every Day Smoker    Packs/day: 1.00    Years: 9.00    Pack years: 9.00    Types: Cigarettes  . Smokeless tobacco: Never Used  Substance and Sexual Activity  . Alcohol  use: No  . Drug use: No  . Sexual activity: Yes    Partners: Male    Birth control/protection: None, Surgical    Comment: tubal   Lifestyle  . Physical activity    Days per week: Not on file    Minutes per session: Not on file  . Stress: Not on file  Relationships  . Social connections  Talks on phone: Not on file    Gets together: Not on file    Attends religious service: Not on file    Active member of club or organization: Not on file    Attends meetings of clubs or organizations: Not on file    Relationship status: Not on file  . Intimate partner violence    Fear of current or ex partner: Not on file    Emotionally abused: Not on file    Physically abused: Not on file    Forced sexual activity: Not on file  Other Topics Concern  . Not on file  Social History Narrative   Married, lives with spouse and 2 kids   Occupation: Chemical engineer     Review of Systems  All other systems reviewed and are negative.      Objective:   Physical Exam  Constitutional: She appears well-developed and well-nourished.  Neck: Neck supple. No thyromegaly present.  Cardiovascular: Normal rate, regular rhythm, normal heart sounds and intact distal pulses.  No murmur heard. Pulmonary/Chest: Effort normal. No respiratory distress. She has no decreased breath sounds. She has no wheezes. She has no rhonchi. She has no rales.  Abdominal: Soft. Bowel sounds are normal. She exhibits no distension. There is no abdominal tenderness. There is no rebound and no guarding.  Musculoskeletal:        General: No edema.  Lymphadenopathy:    She has no cervical adenopathy.  Vitals reviewed.         Assessment & Plan:  Diabetes mellitus type 1, controlled, insulin dependent ([place]) - Plan: Hemoglobin A1c, CBC with Differential/Platelet, COMPLETE METABOLIC PANEL WITH GFR, Lipid panel  Essential hypertension  Pure hypercholesterolemia Her blood pressure today is outstanding.  However I am  concerned that her sugars are out of control.  Patient will check with her insurance to see what long-acting insulin they will cover.  I would switch her to the equivalent dose of 35 units of what ever long-acting insulin they will cover.  If they are not covering long-acting insulin, we can switch the patient to 70/30 insulin.  She is on 45 units total per day of short and long-acting insulin therefore the patient would likely need 30 units of 70/30 in the morning and 15 units of 70/30 in the afternoon at suppertime.  However I would likely start less than this and work her up to that total cumulative dose.  I will check a hemoglobin A1c today, CBC, CMP, and a fasting lipid panel.  As always I recommended smoking cessation.  Patient denies any depression

## 2019-01-25 LAB — LIPID PANEL
Cholesterol: 210 mg/dL — ABNORMAL HIGH (ref <200)
HDL: 28 mg/dL — ABNORMAL LOW (ref >=50)
LDL Cholesterol (Calc): 153 mg/dL (calc) — ABNORMAL HIGH
Non-HDL Cholesterol (Calc): 182 mg/dL (calc) — ABNORMAL HIGH (ref <130)
Total CHOL/HDL Ratio: 7.5 (calc) — ABNORMAL HIGH (ref <5.0)
Triglycerides: 156 mg/dL — ABNORMAL HIGH (ref <150)

## 2019-01-25 LAB — CBC WITH DIFFERENTIAL/PLATELET
Absolute Monocytes: 766 cells/uL (ref 200–950)
Basophils Absolute: 40 cells/uL (ref 0–200)
Basophils Relative: 0.3 %
Eosinophils Absolute: 106 cells/uL (ref 15–500)
Eosinophils Relative: 0.8 %
HCT: 46.1 % — ABNORMAL HIGH (ref 35.0–45.0)
Hemoglobin: 16 g/dL — ABNORMAL HIGH (ref 11.7–15.5)
Lymphs Abs: 3340 cells/uL (ref 850–3900)
MCH: 31.4 pg (ref 27.0–33.0)
MCHC: 34.7 g/dL (ref 32.0–36.0)
MCV: 90.6 fL (ref 80.0–100.0)
MPV: 12.5 fL (ref 7.5–12.5)
Monocytes Relative: 5.8 %
Neutro Abs: 8950 cells/uL — ABNORMAL HIGH (ref 1500–7800)
Neutrophils Relative %: 67.8 %
Platelets: 157 10*3/uL (ref 140–400)
RBC: 5.09 10*6/uL (ref 3.80–5.10)
RDW: 12.2 % (ref 11.0–15.0)
Total Lymphocyte: 25.3 %
WBC: 13.2 10*3/uL — ABNORMAL HIGH (ref 3.8–10.8)

## 2019-01-25 LAB — COMPLETE METABOLIC PANEL WITH GFR
AG Ratio: 1.8 (calc) (ref 1.0–2.5)
ALT: 29 U/L (ref 6–29)
AST: 22 U/L (ref 10–35)
Albumin: 4.4 g/dL (ref 3.6–5.1)
Alkaline phosphatase (APISO): 65 U/L (ref 31–125)
BUN: 11 mg/dL (ref 7–25)
CO2: 29 mmol/L (ref 20–32)
Calcium: 9.9 mg/dL (ref 8.6–10.2)
Chloride: 103 mmol/L (ref 98–110)
Creat: 0.76 mg/dL (ref 0.50–1.10)
GFR, Est African American: 110 mL/min/{1.73_m2} (ref >=60)
GFR, Est Non African American: 95 mL/min/{1.73_m2} (ref >=60)
Globulin: 2.5 g/dL (calc) (ref 1.9–3.7)
Glucose, Bld: 172 mg/dL — ABNORMAL HIGH (ref 65–99)
Potassium: 4.2 mmol/L (ref 3.5–5.3)
Sodium: 139 mmol/L (ref 135–146)
Total Bilirubin: 0.7 mg/dL (ref 0.2–1.2)
Total Protein: 6.9 g/dL (ref 6.1–8.1)

## 2019-01-25 LAB — HEMOGLOBIN A1C
Hgb A1c MFr Bld: 8.7 % of total Hgb — ABNORMAL HIGH (ref <5.7)
Mean Plasma Glucose: 203 (calc)
eAG (mmol/L): 11.2 (calc)

## 2019-01-27 ENCOUNTER — Other Ambulatory Visit: Payer: Self-pay | Admitting: Family Medicine

## 2019-01-27 MED ORDER — LANTUS SOLOSTAR 100 UNIT/ML ~~LOC~~ SOPN: 35.0000 [IU] | PEN_INJECTOR | Freq: Every day | SUBCUTANEOUS | 3 refills | Status: DC

## 2019-02-07 ENCOUNTER — Other Ambulatory Visit: Payer: Self-pay | Admitting: Family Medicine

## 2019-03-15 ENCOUNTER — Other Ambulatory Visit: Payer: Self-pay

## 2019-03-15 ENCOUNTER — Telehealth: Payer: Self-pay | Admitting: Family Medicine

## 2019-03-15 ENCOUNTER — Encounter (HOSPITAL_COMMUNITY): Payer: Self-pay | Admitting: *Deleted

## 2019-03-15 ENCOUNTER — Emergency Department (HOSPITAL_COMMUNITY): Payer: 59

## 2019-03-15 ENCOUNTER — Emergency Department (HOSPITAL_COMMUNITY)
Admission: EM | Admit: 2019-03-15 | Discharge: 2019-03-15 | Disposition: A | Discharge status: Home / Self Care | Payer: 59 | Attending: Emergency Medicine | Admitting: Emergency Medicine

## 2019-03-15 DIAGNOSIS — Z79899 Other long term (current) drug therapy: Secondary | ICD-10-CM | POA: Diagnosis not present

## 2019-03-15 DIAGNOSIS — R1030 Lower abdominal pain, unspecified: Secondary | ICD-10-CM | POA: Diagnosis not present

## 2019-03-15 DIAGNOSIS — J45909 Unspecified asthma, uncomplicated: Secondary | ICD-10-CM | POA: Diagnosis not present

## 2019-03-15 DIAGNOSIS — F17210 Nicotine dependence, cigarettes, uncomplicated: Secondary | ICD-10-CM | POA: Diagnosis not present

## 2019-03-15 DIAGNOSIS — E109 Type 1 diabetes mellitus without complications: Secondary | ICD-10-CM | POA: Insufficient documentation

## 2019-03-15 DIAGNOSIS — I10 Essential (primary) hypertension: Secondary | ICD-10-CM | POA: Insufficient documentation

## 2019-03-15 DIAGNOSIS — Z7982 Long term (current) use of aspirin: Secondary | ICD-10-CM | POA: Insufficient documentation

## 2019-03-15 LAB — CBC
HCT: 48.1 % — ABNORMAL HIGH (ref 36.0–46.0)
Hemoglobin: 15.9 g/dL — ABNORMAL HIGH (ref 12.0–15.0)
MCH: 30.6 pg (ref 26.0–34.0)
MCHC: 33.1 g/dL (ref 30.0–36.0)
MCV: 92.7 fL (ref 80.0–100.0)
Platelets: 182 10*3/uL (ref 150–400)
RBC: 5.19 MIL/uL — ABNORMAL HIGH (ref 3.87–5.11)
RDW: 11.8 % (ref 11.5–15.5)
WBC: 9.7 10*3/uL (ref 4.0–10.5)
nRBC: 0 % (ref 0.0–0.2)

## 2019-03-15 LAB — DIFFERENTIAL
Abs Immature Granulocytes: 0.03 10*3/uL (ref 0.00–0.07)
Basophils Absolute: 0 10*3/uL (ref 0.0–0.1)
Basophils Relative: 0 %
Eosinophils Absolute: 0.1 10*3/uL (ref 0.0–0.5)
Eosinophils Relative: 1 %
Immature Granulocytes: 0 %
Lymphocytes Relative: 45 %
Lymphs Abs: 4.4 10*3/uL — ABNORMAL HIGH (ref 0.7–4.0)
Monocytes Absolute: 0.6 10*3/uL (ref 0.1–1.0)
Monocytes Relative: 6 %
Neutro Abs: 4.6 10*3/uL (ref 1.7–7.7)
Neutrophils Relative %: 48 %

## 2019-03-15 LAB — COMPREHENSIVE METABOLIC PANEL
ALT: 32 U/L (ref 0–44)
AST: 23 U/L (ref 15–41)
Albumin: 4.6 g/dL (ref 3.5–5.0)
Alkaline Phosphatase: 69 U/L (ref 38–126)
Anion gap: 10 (ref 5–15)
BUN: 9 mg/dL (ref 6–20)
CO2: 27 mmol/L (ref 22–32)
Calcium: 9.7 mg/dL (ref 8.9–10.3)
Chloride: 104 mmol/L (ref 98–111)
Creatinine, Ser: 0.66 mg/dL (ref 0.44–1.00)
GFR calc Af Amer: >60 mL/min (ref >60)
GFR calc non Af Amer: >60 mL/min (ref >60)
Glucose, Bld: 90 mg/dL (ref 70–99)
Potassium: 3.7 mmol/L (ref 3.5–5.1)
Sodium: 141 mmol/L (ref 135–145)
Total Bilirubin: 0.6 mg/dL (ref 0.3–1.2)
Total Protein: 7.8 g/dL (ref 6.5–8.1)

## 2019-03-15 LAB — URINALYSIS, ROUTINE W REFLEX MICROSCOPIC
Bilirubin Urine: NEGATIVE
Glucose, UA: NEGATIVE mg/dL
Hgb urine dipstick: NEGATIVE
Ketones, ur: NEGATIVE mg/dL
Leukocytes,Ua: NEGATIVE
Nitrite: NEGATIVE
Protein, ur: NEGATIVE mg/dL
Specific Gravity, Urine: 1.004 — ABNORMAL LOW (ref 1.005–1.030)
pH: 7 (ref 5.0–8.0)

## 2019-03-15 LAB — CBG MONITORING, ED
Glucose-Capillary: 52 mg/dL — ABNORMAL LOW (ref 70–99)
Glucose-Capillary: 93 mg/dL (ref 70–99)

## 2019-03-15 LAB — LIPASE, BLOOD: Lipase: 22 U/L (ref 11–51)

## 2019-03-15 LAB — POC URINE PREG, ED: Preg Test, Ur: NEGATIVE

## 2019-03-15 MED ORDER — SODIUM CHLORIDE 0.9% FLUSH
3.0000 mL | Freq: Once | INTRAVENOUS | Status: AC
  Administered 2019-03-15: 17:00:00 3 mL via INTRAVENOUS

## 2019-03-15 MED ORDER — IOHEXOL 300 MG/ML  SOLN
100.0000 mL | Freq: Once | INTRAMUSCULAR | Status: AC | PRN
  Administered 2019-03-15: 17:00:00 100 mL via INTRAVENOUS

## 2019-03-15 MED ORDER — DICYCLOMINE HCL 20 MG PO TABS: ORAL_TABLET | ORAL | 0 refills | Status: DC

## 2019-03-15 MED ORDER — HYDROMORPHONE HCL 1 MG/ML IJ SOLN
1.0000 mg | Freq: Once | INTRAMUSCULAR | Status: AC
  Administered 2019-03-15: 16:00:00 1 mg via INTRAVENOUS
  Filled 2019-03-15: qty 1

## 2019-03-15 MED ORDER — ONDANSETRON HCL 4 MG/2ML IJ SOLN
4.0000 mg | Freq: Once | INTRAMUSCULAR | Status: AC
  Administered 2019-03-15: 4 mg via INTRAVENOUS
  Filled 2019-03-15: qty 2

## 2019-03-15 MED ORDER — ONDANSETRON 4 MG PO TBDP: ORAL_TABLET | ORAL | 0 refills | Status: DC

## 2019-03-15 NOTE — ED Notes (Signed)
Pt given peanut butter crackers and orange juice to raise blood sugar.

## 2019-03-15 NOTE — ED Provider Notes (Signed)
[facility] EMERGENCY DEPARTMENT Provider Note   CSN: [id number] Arrival date & time: 03/15/19  1355     History Chief Complaint  Patient presents with  . Abdominal Pain    [name] is a 43 y.o. female.  Patient complains of right lower quadrant abdominal pain no fever no chills no urinary symptoms, no vaginal discharge  The history is provided by the patient. No language interpreter was used.  Abdominal Pain Pain location:  RLQ Pain quality: aching   Pain radiates to:  Does not radiate Pain severity:  Moderate Onset quality:  Sudden Timing:  Constant Progression:  Waxing and waning Chronicity:  New Context: not alcohol use   Associated symptoms: no chest pain, no cough, no diarrhea, no fatigue and no hematuria        Past Medical History:  Diagnosis Date  . Asthma   . Carpal tunnel syndrome on left   . Complication of anesthesia 07/2014   Mouth was swollen on inside, Front tooth was chipped  . Diabetes mellitus    19 years ago  . Diabetic neuropathy ([place])   . Diabetic retinopathy ([place])   . Essential hypertension 12/23/2015  . GERD (gastroesophageal reflux disease)   . Headache    Migraine- a long time ago  . Hypercholesteremia   . Hyperlipidemia 12/23/2015  . Hypertension   . Muscle spasms of lower extremity    legs and weakness  . Pneumonia   . Restless leg   . Type 1 diabetes mellitus ([place])   . Vitreous hemorrhage, left eye [facility])     Patient Active Problem List   Diagnosis Date Noted  . Carpal tunnel syndrome, bilateral 04/13/2017  . Chest pain with moderate risk for cardiac etiology   . Angina pectoris ([place])   . Essential hypertension 12/23/2015  . Hyperlipidemia 12/23/2015  . Tobacco use disorder 02/25/2015  . Vitreous hemorrhage of right eye ([place]) 08/28/2014  . Vitreous hemorrhage ([place]) 08/08/2014  . Proliferative diabetic retinopathy ([place]) 08/08/2014  . Asthma with bronchitis 12/26/2012  . Type II diabetes mellitus ([place]) 12/26/2012  .  Abnormal HPV Pap smear, poor sample 01/29/2011    Past Surgical History:  Procedure Laterality Date  . CARDIAC CATHETERIZATION N/A 12/27/2015   Procedure: Left Heart Cath and Coronary Angiography;  Surgeon: Troy Sine, MD;  Location: [place] [facility];  Service: Cardiovascular;  Laterality: N/A;  . [place], 2002  . DILATION AND CURETTAGE OF UTERUS  1997  . GAS/FLUID EXCHANGE Right 08/28/2014   Procedure: GAS/FLUID EXCHANGE;  Surgeon: Hayden Pedro, MD;  Location: [place];  Service: Ophthalmology;  Laterality: Right;  . LASER PHOTO ABLATION     left x 2, right x 1  . LASER PHOTO ABLATION Right 08/28/2014   Procedure: LASER PHOTO ABLATION;  Surgeon: Hayden Pedro, MD;  Location: [place];  Service: Ophthalmology;  Laterality: Right;  . MEMBRANE PEEL Right 08/28/2014   Procedure: MEMBRANE PEEL;  Surgeon: Hayden Pedro, MD;  Location: [place];  Service: Ophthalmology;  Laterality: Right;  . PARS PLANA VITRECTOMY Right 08/28/2014   Procedure: PARS PLANA VITRECTOMY WITH 25 GAUGE;  Surgeon: Hayden Pedro, MD;  Location: [place];  Service: Ophthalmology;  Laterality: Right;  . PARS PLANA VITRECTOMY Left 12/24/2014   Procedure: LEFT PARS PLANA VITRECTOMY WITH 25 GAUGE WITH ENDOLASER ;  Surgeon: Hurman Horn, MD;  Location: [place];  Service: Ophthalmology;  Laterality: Left;  . TUBAL LIGATION  2002  .  WISDOM TOOTH EXTRACTION     age 73     OB History    Gravida  4   Para  3   Term  2   Preterm  1   AB  1   Living  3     SAB      TAB      Ectopic      Multiple      Live Births              Family History  Problem Relation Age of Onset  . Diabetes Mother   . Parkinsonism Mother   . Diabetes Father   . Hypertension Father   . Diabetes Brother   . Heart failure Maternal Grandmother   . Diabetes Maternal Grandmother     Social History   Tobacco Use  . Smoking status: Current Every Day Smoker    Packs/day: 1.00    Years: 9.00    Pack years:  9.00    Types: Cigarettes  . Smokeless tobacco: Never Used  Substance Use Topics  . Alcohol use: No  . Drug use: No    Home Medications Prior to Admission medications   Medication Sig Start Date End Date Taking? Authorizing Provider  albuterol (PROAIR HFA) 108 (90 Base) MCG/ACT inhaler 2 PUFFS EVERY 6 HOURS AS NEEDED for shortness of breath Patient taking differently: Inhale 2 puffs into the lungs every 6 (six) hours as needed for wheezing or shortness of breath.  02/19/16  Yes Susy Frizzle, MD  albuterol (PROVENTIL) (2.5 MG/3ML) 0.083% nebulizer solution Take 3 mLs (2.5 mg total) by nebulization every 4 (four) hours as needed for wheezing or shortness of breath. 11/11/15  Yes Susy Frizzle, MD  aspirin EC 81 MG tablet Take 81 mg by mouth daily.   Yes [provider]  carvedilol (COREG) 25 MG tablet Take 1 tablet by mouth twice daily Patient taking differently: Take 25 mg by mouth every evening.  02/07/19  Yes Susy Frizzle, MD  Insulin Glargine (LANTUS SOLOSTAR) 100 UNIT/ML Solostar Pen Inject 35 Units into the skin daily. Patient taking differently: Inject 35 Units into the skin every morning.  01/27/19  Yes Susy Frizzle, MD  insulin lispro (HUMALOG) 100 UNIT/ML injection INJECT 3 TO 8 UNITS SUBCUTANEOUSLY THREE TIMES DAILY WITH MEALS Patient taking differently: Inject 3-8 Units into the skin 3 (three) times daily with meals.  06/17/18  Yes Susy Frizzle, MD  losartan (COZAAR) 100 MG tablet TAKE 1 TABLET BY MOUTH ONCE DAILY . APPOINTMENT REQUIRED FOR FUTURE REFILLS Patient taking differently: Take 100 mg by mouth every evening.  02/07/19  Yes Susy Frizzle, MD  OVER THE COUNTER MEDICATION Take 2 tablets by mouth daily as needed. Restless leg relief    Yes [provider]  dicyclomine (BENTYL) 20 MG tablet Take one pill every 6-8 hours for abd cramps 03/15/19   Milton Ferguson, MD  ondansetron (ZOFRAN ODT) 4 MG disintegrating tablet 4mg  ODT q4 hours  prn nausea/vomit 03/15/19   Milton Ferguson, MD    Allergies    Fish-derived products, Demerol, Morphine and related, and Zocor [simvastatin]  Review of Systems   Review of Systems  Constitutional: Negative for appetite change and fatigue.  HENT: Negative for congestion, ear discharge and sinus pressure.   Eyes: Negative for discharge.  Respiratory: Negative for cough.   Cardiovascular: Negative for chest pain.  Gastrointestinal: Positive for abdominal pain. Negative for diarrhea.  Genitourinary: Negative for frequency  and hematuria.  Musculoskeletal: Negative for back pain.  Skin: Negative for rash.  Neurological: Negative for seizures and headaches.  Psychiatric/Behavioral: Negative for hallucinations.    Physical Exam Updated Vital Signs BP (!) 154/94 (BP Location: Right Arm)   Pulse 99   Temp 98 F (36.7 C) (Oral)   Resp 20   Ht 5\' 3"  (1.6 m)   Wt 74.8 kg   SpO2 100%   BMI 29.23 kg/m   Physical Exam Vitals and nursing note reviewed.  Constitutional:      Appearance: She is well-developed.  HENT:     Head: Normocephalic.     Nose: Nose normal.  Eyes:     General: No scleral icterus.    Conjunctiva/sclera: Conjunctivae normal.  Neck:     Thyroid: No thyromegaly.  Cardiovascular:     Rate and Rhythm: Normal rate and regular rhythm.     Heart sounds: No murmur. No friction rub. No gallop.   Pulmonary:     Breath sounds: No stridor. No wheezing or rales.  Chest:     Chest wall: No tenderness.  Abdominal:     General: There is no distension.     Tenderness: There is abdominal tenderness. There is no rebound.  Musculoskeletal:        General: Normal range of motion.     Cervical back: Neck supple.  Lymphadenopathy:     Cervical: No cervical adenopathy.  Skin:    Findings: No erythema or rash.  Neurological:     Mental Status: She is oriented to person, place, and time.     Motor: No abnormal muscle tone.     Coordination: Coordination normal.   Psychiatric:        Behavior: Behavior normal.     ED Results / Procedures / Treatments   Labs (all labs ordered are listed, but only abnormal results are displayed) Labs Reviewed  CBC - Abnormal; Notable for the following components:      Result Value   RBC 5.19 (*)    Hemoglobin 15.9 (*)    HCT 48.1 (*)    All other components within normal limits  URINALYSIS, ROUTINE W REFLEX MICROSCOPIC - Abnormal; Notable for the following components:   Color, Urine STRAW (*)    Specific Gravity, Urine 1.004 (*)    All other components within normal limits  DIFFERENTIAL - Abnormal; Notable for the following components:   Lymphs Abs 4.4 (*)    All other components within normal limits  LIPASE, BLOOD  COMPREHENSIVE METABOLIC PANEL  POC URINE PREG, ED    EKG None  Radiology CT ABDOMEN PELVIS W CONTRAST  Result Date: 03/15/2019 CLINICAL DATA:  Right lower quadrant abdominal pain for 2 days EXAM: CT ABDOMEN AND PELVIS WITH CONTRAST TECHNIQUE: Multidetector CT imaging of the abdomen and pelvis was performed using the standard protocol following bolus administration of intravenous contrast. CONTRAST:  177mL OMNIPAQUE IOHEXOL 300 MG/ML  SOLN COMPARISON:  None. FINDINGS: Lower chest: No acute abnormality. Hepatobiliary: Mildly decreased attenuation of the hepatic parenchyma suggesting hepatic steatosis. No focal liver lesion. Gallbladder appears unremarkable. No gallstone. No biliary dilatation. Pancreas: Unremarkable. No pancreatic ductal dilatation or surrounding inflammatory changes. Spleen: Scattered punctate calcifications within the spleen suggest sequela of chronic granulomatous disease. Spleen is otherwise unremarkable in appearance. Adrenals/Urinary Tract: Unremarkable adrenal glands. 9 mm cortically based low-density lesion within the midpole of the right kidney, most likely representing cyst. Kidneys are otherwise unremarkable. No hydronephrosis. Ureters are nondilated. Urinary bladder  appears  unremarkable. Stomach/Bowel: Stomach is within normal limits. Normal appendix is present within the right lower quadrant. No evidence of bowel wall thickening, distention, or inflammatory changes. Vascular/Lymphatic: Mild scattered atherosclerotic calcification of the abdominal aorta. No acute vascular abnormality. No abdominopelvic lymphadenopathy. Reproductive: Uterus and bilateral adnexa are unremarkable. Other: No abdominal wall hernia or abnormality. No abdominopelvic ascites. Musculoskeletal: No acute or significant osseous findings. IMPRESSION: 1. No acute abdominopelvic findings.  Normal appendix. 2. Hepatic steatosis. 3. Aortic atherosclerosis. Aortic Atherosclerosis (ICD10-I70.0). Electronically Signed   By: Davina Poke M.D.   On: 03/15/2019 17:36    Procedures Procedures (including critical care time)  Medications Ordered in ED Medications  sodium chloride flush (NS) 0.9 % injection 3 mL (3 mLs Intravenous Given by Other 03/15/19 1637)  HYDROmorphone (DILAUDID) injection 1 mg (1 mg Intravenous Given 03/15/19 1618)  ondansetron (ZOFRAN) injection 4 mg (4 mg Intravenous Given 03/15/19 1618)  iohexol (OMNIPAQUE) 300 MG/ML solution 100 mL (100 mLs Intravenous Contrast Given 03/15/19 1713)    ED Course  I have reviewed the triage vital signs and the nursing notes.  Pertinent labs & imaging results that were available during my care of the patient were reviewed by me and considered in my medical decision making (see chart for details).    MDM Rules/Calculators/A&P                      Labs and CT scan unremarkable.  Patient given some Bentyl for her abdominal discomfort and she will follow-up with her PCP Final Clinical Impression(s) / ED Diagnoses Final diagnoses:  Lower abdominal pain    Rx / DC Orders ED Discharge Orders         Ordered    dicyclomine (BENTYL) 20 MG tablet     03/15/19 1821    ondansetron (ZOFRAN ODT) 4 MG disintegrating tablet     03/15/19  1821           Milton Ferguson, MD 03/15/19 1830

## 2019-03-15 NOTE — Discharge Instructions (Addendum)
Follow-up With your family doctor if you continue with this pain

## 2019-03-15 NOTE — ED Triage Notes (Signed)
Pt c/o right lower abdomen for the last couple of days; pt c/o nausea; pt denies any urinary sx

## 2019-03-15 NOTE — Telephone Encounter (Signed)
Pt called and states that she is having really painful right abd pain. She is not running a fever or having any urinary issues. We do not have any apts in clinic today so I recommended she proceed to the ER to have checked as it could be her appendic's . Pt verbalized understanding and will proceed to the ER.

## 2019-04-11 ENCOUNTER — Other Ambulatory Visit: Payer: Self-pay | Admitting: Family Medicine

## 2019-06-21 ENCOUNTER — Other Ambulatory Visit: Payer: Self-pay | Admitting: Family Medicine

## 2019-07-19 ENCOUNTER — Other Ambulatory Visit: Payer: Self-pay | Admitting: Family Medicine

## 2019-08-22 ENCOUNTER — Other Ambulatory Visit: Payer: Self-pay | Admitting: Family Medicine

## 2019-09-27 ENCOUNTER — Other Ambulatory Visit: Payer: Self-pay | Admitting: Family Medicine

## 2019-10-28 ENCOUNTER — Other Ambulatory Visit: Payer: Self-pay | Admitting: Family Medicine

## 2020-01-11 ENCOUNTER — Other Ambulatory Visit: Payer: Self-pay | Admitting: Family Medicine

## 2020-02-19 ENCOUNTER — Other Ambulatory Visit: Payer: Self-pay | Admitting: Family Medicine

## 2020-03-06 ENCOUNTER — Other Ambulatory Visit: Payer: Self-pay | Admitting: Family Medicine

## 2020-03-24 ENCOUNTER — Other Ambulatory Visit: Payer: Self-pay | Admitting: Family Medicine

## 2020-04-05 ENCOUNTER — Ambulatory Visit (INDEPENDENT_AMBULATORY_CARE_PROVIDER_SITE_OTHER): Payer: 59 | Admitting: Family Medicine

## 2020-04-05 ENCOUNTER — Ambulatory Visit: Payer: Self-pay | Admitting: Family Medicine

## 2020-04-05 ENCOUNTER — Other Ambulatory Visit: Payer: Self-pay

## 2020-04-05 VITALS — BP 124/70 | HR 88 | Temp 97.4°F | Wt 172.0 lb

## 2020-04-05 DIAGNOSIS — E109 Type 1 diabetes mellitus without complications: Secondary | ICD-10-CM | POA: Diagnosis not present

## 2020-04-05 NOTE — Progress Notes (Signed)
Subjective:    Patient ID: [name], female    DOB: 06-Sep-1975, 45 y.o.   MRN: [id number] 12/2018 Patient is here today for a checkup.  Since I last saw the patient, her insurance stopped covering the [employer].  She was previously on 35 units a day of this in addition to fast acting insulin which she takes approximately 5 units twice a day.  Therefore she was on a total supply of 45 units in a 24-hour.  And this was managing her sugars well.  However over the last several months she has not been taking the [employer].  Instead she has been trying to manage herself with rapid acting insulin 3 times a day and having very little success.  She states that her sugars are high, greater than 200 and she anticipates that her A1c will be out of control.  She is also smoking and she is smoking half a pack of cigarettes a day.  She is now staying home from work caring for her son who has developmental delay.  This is due to the fact she was laid off from her job with the COVID-19 pandemic.  She denies any chest pain shortness of breath or dyspnea on exertion.  She is overdue for diabetic eye exam.  Diabetic foot exam was performed today and is normal.  She denies any neuropathy in her feet.  AT that time, my plan was: Her blood pressure today is outstanding.  However I am concerned that her sugars are out of control.  Patient will check with her insurance to see what long-acting insulin they will cover.  I would switch her to the equivalent dose of 35 units of what ever long-acting insulin they will cover.  If they are not covering long-acting insulin, we can switch the patient to 70/30 insulin.  She is on 45 units total per day of short and long-acting insulin therefore the patient would likely need 30 units of 70/30 in the morning and 15 units of 70/30 in the afternoon at suppertime.  However I would likely start less than this and work her up to that total cumulative dose.  I will check a hemoglobin A1c today,  CBC, CMP, and a fasting lipid panel.  As always I recommended smoking cessation.  Patient denies any depression  04/05/20 Patient is a very kind 45 year old Caucasian female here today for a checkup.  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  Unfortunately she has not had her Covid shot yet.  She has a history of asthma and frequently has to use her albuterol.  She also smokes.  Given her type 1 diabetes, her smoking, and her asthma, I am extremely worried if she contracted Covid.  I believe she is at a very high risk profession being a Educational psychologist.  Therefore I spent the most of our visit today trying to talk her into the Covid vaccine.  At the present time she seems hesitant to receive it due to the fact she is had bad reactions to the flu shot in the past.  I did beg her to reconsider.  Her blood pressure today is outstanding.  She states that her blood sugars been running high.  She gave me a ballpark that her blood sugars are in the low to mid 200s.  She admits that she has been more active with work and therefore she may not have been eating as well.  She is also smoking about a half  a pack of cigarettes a day. Past Medical History:  Diagnosis Date  . Asthma   . Carpal tunnel syndrome on left   . Complication of anesthesia 07/2014   Mouth was swollen on inside, Front tooth was chipped  . Diabetes mellitus    19 years ago  . Diabetic neuropathy ([place])   . Diabetic retinopathy ([place])   . Essential hypertension 12/23/2015  . GERD (gastroesophageal reflux disease)   . Headache    Migraine- a long time ago  . Hypercholesteremia   . Hyperlipidemia 12/23/2015  . Hypertension   . Muscle spasms of lower extremity    legs and weakness  . Pneumonia   . Restless leg   . Type 1 diabetes mellitus ([place])   . Vitreous hemorrhage, left eye [facility])    Past Surgical History:  Procedure Laterality Date  . CARDIAC CATHETERIZATION N/A 12/27/2015   Procedure: Left Heart Cath and Coronary  Angiography;  Surgeon: Troy Sine, MD;  Location: [place] [facility];  Service: Cardiovascular;  Laterality: N/A;  . [place], 2002  . DILATION AND CURETTAGE OF UTERUS  1997  . GAS/FLUID EXCHANGE Right 08/28/2014   Procedure: GAS/FLUID EXCHANGE;  Surgeon: Hayden Pedro, MD;  Location: [place];  Service: Ophthalmology;  Laterality: Right;  . LASER PHOTO ABLATION     left x 2, right x 1  . LASER PHOTO ABLATION Right 08/28/2014   Procedure: LASER PHOTO ABLATION;  Surgeon: Hayden Pedro, MD;  Location: [place];  Service: Ophthalmology;  Laterality: Right;  . MEMBRANE PEEL Right 08/28/2014   Procedure: MEMBRANE PEEL;  Surgeon: Hayden Pedro, MD;  Location: [place];  Service: Ophthalmology;  Laterality: Right;  . PARS PLANA VITRECTOMY Right 08/28/2014   Procedure: PARS PLANA VITRECTOMY WITH 25 GAUGE;  Surgeon: Hayden Pedro, MD;  Location: [place];  Service: Ophthalmology;  Laterality: Right;  . PARS PLANA VITRECTOMY Left 12/24/2014   Procedure: LEFT PARS PLANA VITRECTOMY WITH 25 GAUGE WITH ENDOLASER ;  Surgeon: Hurman Horn, MD;  Location: [place];  Service: Ophthalmology;  Laterality: Left;  . TUBAL LIGATION  2002  . WISDOM TOOTH EXTRACTION     age 42   Current Outpatient Medications on File Prior to Visit  Medication Sig Dispense Refill  . albuterol (PROAIR HFA) 108 (90 Base) MCG/ACT inhaler 2 PUFFS EVERY 6 HOURS AS NEEDED for shortness of breath (Patient taking differently: Inhale 2 puffs into the lungs every 6 (six) hours as needed for wheezing or shortness of breath. ) 3 each 3  . albuterol (PROVENTIL) (2.5 MG/3ML) 0.083% nebulizer solution Take 3 mLs (2.5 mg total) by nebulization every 4 (four) hours as needed for wheezing or shortness of breath. 150 mL 1  . aspirin EC 81 MG tablet Take 81 mg by mouth daily.    . carvedilol (COREG) 25 MG tablet Take 1 tablet by mouth in the evening 30 tablet 0  . dicyclomine (BENTYL) 20 MG tablet Take one pill every 6-8 hours for abd  cramps 20 tablet 0  . HUMALOG 100 UNIT/ML injection INJECT 3 TO 8 UNITS SUBCUTANEOUSLY THREE TIMES DAILY WITH MEALS 10 mL 0  . LANTUS SOLOSTAR 100 UNIT/ML Solostar Pen INJECT 35 UNITS SUBCUTANEOUSLY ONCE DAILY 15 mL 0  . losartan (COZAAR) 100 MG tablet TAKE 1 TABLET BY MOUTH ONCE DAILY . APPOINTMENT REQUIRED FOR FUTURE REFILLS 30 tablet 3  . ondansetron (ZOFRAN ODT) 4 MG disintegrating tablet 4mg  ODT q4 hours prn nausea/vomit  12 tablet 0  . ONETOUCH VERIO test strip USE AS DIRECTED 100 each 3  . OVER THE COUNTER MEDICATION Take 2 tablets by mouth daily as needed. Restless leg relief      No current facility-administered medications on file prior to visit.   Allergies  Allergen Reactions  . Fish-Derived Products Hives and Shortness Of Breath  . Demerol Itching and Rash  . Morphine And Related Itching and Rash       . Zocor [Simvastatin] Itching and Other (See Comments)    Leg pain   Social History   Socioeconomic History  . Marital status: Married    Spouse name: Not on file  . Number of children: Not on file  . Years of education: Not on file  . Highest education level: Not on file  Occupational History  . Not on file  Tobacco Use  . Smoking status: Current Every Day Smoker    Packs/day: 1.00    Years: 9.00    Pack years: 9.00    Types: Cigarettes  . Smokeless tobacco: Never Used  Substance and Sexual Activity  . Alcohol use: No  . Drug use: No  . Sexual activity: Yes    Partners: Male    Birth control/protection: None, Surgical    Comment: tubal   Other Topics Concern  . Not on file  Social History Narrative   Married, lives with spouse and 2 kids   Occupation: Chemical engineer   Social Determinants of Health   Financial Resource Strain: Not on file  Food Insecurity: Not on file  Transportation Needs: Not on file  Physical Activity: Not on file  Stress: Not on file  Social Connections: Not on file  Intimate Partner Violence: Not on file     Review of  Systems  All other systems reviewed and are negative.      Objective:   Physical Exam Vitals reviewed.  Constitutional:      Appearance: She is well-developed.  Neck:     Thyroid: No thyromegaly.  Cardiovascular:     Rate and Rhythm: Normal rate and regular rhythm.     Heart sounds: Normal heart sounds. No murmur heard.   Pulmonary:     Effort: Pulmonary effort is normal. No respiratory distress.     Breath sounds: No decreased breath sounds, wheezing, rhonchi or rales.  Abdominal:     General: Bowel sounds are normal. There is no distension.     Palpations: Abdomen is soft.     Tenderness: There is no abdominal tenderness. There is no guarding or rebound.  Musculoskeletal:     Cervical back: Neck supple.  Lymphadenopathy:     Cervical: No cervical adenopathy.           Assessment & Plan:  Diabetes mellitus type 1, controlled, insulin dependent ([place]) - Plan: Hemoglobin A1c, COMPLETE METABOLIC PANEL WITH GFR, Lipid panel, CBC, Flu Vaccine QUAD 36+ mos IM Surprisingly, her lungs sound really good today.  She is not wheezing and her breath sounds are clear.  Her blood pressure is outstanding.  I repeatedly begged the patient to get the Covid shot.  I do really worry about her long-term health if she were to get sick given her medical comorbidities.  I will check an A1c, CMP, lipid panel.  I offered the patient a flu shot today but she politely declined.  I would like her A1c to be below 7.  If her sugars are in the 200s, I believe that we  can uptitrate her Lantus further until her fasting blood sugars are under 130.  She continues to take Humalog anywhere between 5 and 8 units with meals.  We may need to increase this is well to get her 2-hour postprandial sugars below 200.  Await to see the results of her lab work first

## 2020-04-06 LAB — CBC
HCT: 46.4 % — ABNORMAL HIGH (ref 35.0–45.0)
Hemoglobin: 16.3 g/dL — ABNORMAL HIGH (ref 11.7–15.5)
MCH: 31.8 pg (ref 27.0–33.0)
MCHC: 35.1 g/dL (ref 32.0–36.0)
MCV: 90.4 fL (ref 80.0–100.0)
MPV: 11.9 fL (ref 7.5–12.5)
Platelets: 192 10*3/uL (ref 140–400)
RBC: 5.13 10*6/uL — ABNORMAL HIGH (ref 3.80–5.10)
RDW: 11.8 % (ref 11.0–15.0)
WBC: 10.9 10*3/uL — ABNORMAL HIGH (ref 3.8–10.8)

## 2020-04-06 LAB — COMPLETE METABOLIC PANEL WITH GFR
AG Ratio: 1.8 (calc) (ref 1.0–2.5)
ALT: 25 U/L (ref 6–29)
AST: 22 U/L (ref 10–35)
Albumin: 4.4 g/dL (ref 3.6–5.1)
Alkaline phosphatase (APISO): 69 U/L (ref 31–125)
BUN: 15 mg/dL (ref 7–25)
CO2: 29 mmol/L (ref 20–32)
Calcium: 9.9 mg/dL (ref 8.6–10.2)
Chloride: 105 mmol/L (ref 98–110)
Creat: 0.76 mg/dL (ref 0.50–1.10)
GFR, Est African American: 109 mL/min/{1.73_m2} (ref >=60)
GFR, Est Non African American: 94 mL/min/{1.73_m2} (ref >=60)
Globulin: 2.5 g/dL (calc) (ref 1.9–3.7)
Glucose, Bld: 53 mg/dL — ABNORMAL LOW (ref 65–99)
Potassium: 4 mmol/L (ref 3.5–5.3)
Sodium: 142 mmol/L (ref 135–146)
Total Bilirubin: 0.5 mg/dL (ref 0.2–1.2)
Total Protein: 6.9 g/dL (ref 6.1–8.1)

## 2020-04-06 LAB — LIPID PANEL
Cholesterol: 248 mg/dL — ABNORMAL HIGH (ref <200)
HDL: 30 mg/dL — ABNORMAL LOW (ref >=50)
LDL Cholesterol (Calc): 187 mg/dL (calc) — ABNORMAL HIGH
Non-HDL Cholesterol (Calc): 218 mg/dL (calc) — ABNORMAL HIGH (ref <130)
Total CHOL/HDL Ratio: 8.3 (calc) — ABNORMAL HIGH (ref <5.0)
Triglycerides: 159 mg/dL — ABNORMAL HIGH (ref <150)

## 2020-04-06 LAB — MICROALBUMIN, URINE: Microalb, Ur: 0.3 mg/dL

## 2020-04-06 LAB — HEMOGLOBIN A1C
Hgb A1c MFr Bld: 9.5 % of total Hgb — ABNORMAL HIGH (ref <5.7)
Mean Plasma Glucose: 226 mg/dL
eAG (mmol/L): 12.5 mmol/L

## 2020-04-26 ENCOUNTER — Other Ambulatory Visit: Payer: Self-pay | Admitting: Family Medicine

## 2020-06-01 ENCOUNTER — Other Ambulatory Visit: Payer: Self-pay | Admitting: Family Medicine

## 2020-07-10 ENCOUNTER — Other Ambulatory Visit: Payer: Self-pay | Admitting: Family Medicine

## 2020-08-12 ENCOUNTER — Other Ambulatory Visit: Payer: Self-pay | Admitting: Family Medicine

## 2020-08-20 ENCOUNTER — Other Ambulatory Visit: Payer: Self-pay | Admitting: Family Medicine

## 2020-10-02 ENCOUNTER — Other Ambulatory Visit: Payer: Self-pay | Admitting: Family Medicine

## 2020-11-10 ENCOUNTER — Other Ambulatory Visit: Payer: Self-pay | Admitting: Family Medicine

## 2020-11-18 ENCOUNTER — Other Ambulatory Visit: Payer: Self-pay | Admitting: Family Medicine

## 2020-12-19 ENCOUNTER — Other Ambulatory Visit: Payer: Self-pay | Admitting: Family Medicine

## 2020-12-30 ENCOUNTER — Other Ambulatory Visit: Payer: Self-pay | Admitting: Family Medicine

## 2021-01-30 ENCOUNTER — Other Ambulatory Visit: Payer: Self-pay | Admitting: Family Medicine

## 2021-01-30 NOTE — Telephone Encounter (Signed)
Pt not seen for HTN since 2019.  Also, LOV 04/05/20, pt had lab results w/instructions for insulin changes and request to update in 1 week. No documentation this message was relayed to pt and no documented follow up in chart. Please advise. Thank you!

## 2021-02-06 ENCOUNTER — Other Ambulatory Visit: Payer: Self-pay | Admitting: Family Medicine

## 2021-02-26 ENCOUNTER — Other Ambulatory Visit: Payer: Self-pay | Admitting: Family Medicine

## 2021-03-19 ENCOUNTER — Other Ambulatory Visit: Payer: Self-pay | Admitting: Family Medicine

## 2021-04-03 ENCOUNTER — Ambulatory Visit: Payer: 59 | Admitting: Family Medicine

## 2021-04-04 ENCOUNTER — Other Ambulatory Visit: Payer: Self-pay

## 2021-04-04 ENCOUNTER — Encounter: Payer: Self-pay | Admitting: Family Medicine

## 2021-04-04 ENCOUNTER — Ambulatory Visit (INDEPENDENT_AMBULATORY_CARE_PROVIDER_SITE_OTHER): Payer: 59 | Admitting: Family Medicine

## 2021-04-04 ENCOUNTER — Other Ambulatory Visit: Payer: Self-pay | Admitting: Family Medicine

## 2021-04-04 VITALS — BP 126/78 | HR 98 | Ht 63.0 in | Wt 171.0 lb

## 2021-04-04 DIAGNOSIS — Z1211 Encounter for screening for malignant neoplasm of colon: Secondary | ICD-10-CM

## 2021-04-04 DIAGNOSIS — E7800 Pure hypercholesterolemia, unspecified: Secondary | ICD-10-CM | POA: Diagnosis not present

## 2021-04-04 DIAGNOSIS — I10 Essential (primary) hypertension: Secondary | ICD-10-CM | POA: Diagnosis not present

## 2021-04-04 DIAGNOSIS — E103599 Type 1 diabetes mellitus with proliferative diabetic retinopathy without macular edema, unspecified eye: Secondary | ICD-10-CM | POA: Diagnosis not present

## 2021-04-04 DIAGNOSIS — Z1231 Encounter for screening mammogram for malignant neoplasm of breast: Secondary | ICD-10-CM

## 2021-04-04 DIAGNOSIS — F17200 Nicotine dependence, unspecified, uncomplicated: Secondary | ICD-10-CM | POA: Diagnosis not present

## 2021-04-04 DIAGNOSIS — E109 Type 1 diabetes mellitus without complications: Secondary | ICD-10-CM

## 2021-04-04 MED ORDER — LEVOCETIRIZINE DIHYDROCHLORIDE 5 MG PO TABS: 5.0000 mg | ORAL_TABLET | Freq: Every evening | ORAL | 5 refills | Status: DC

## 2021-04-04 NOTE — Progress Notes (Signed)
Subjective:    Patient ID: [name], female    DOB: 05-Aug-1975, 46 y.o.   MRN: [id number] Here for follow up of her diabetes mellitus 1 (last hgA1c was 9.3 1/22) as well as her HLD (last LDL was 187 1/22).  Has  a history of diabetic retinopathy.  Patient states that she has not seen her eye doctor in over a year.  She is willing to allow me to schedule that for her today.  She states that her fasting blood sugars are between 100 130 although she admits that she is not checking them as often as she would like to.  However her 2-hour postprandial sugars are often close to 200 and as high as 260.  She denies any polyuria or polydipsia although she does have blurred vision secondary to her retinopathy.  She has had Pneumovax 23 last in 2019.  She is due for her diabetic eye exam.  She is due for her mammogram.  She is due for a colonoscopy.  She is also due for a Pap smear.  We discussed all of these issues today.  Unfortunately she continues to smoke although she has decreased smoking to 3 packs a week.  She does report allergies and wheezing secondary to allergies Past Medical History:  Diagnosis Date   Asthma    Carpal tunnel syndrome on left    Complication of anesthesia 07/2014   Mouth was swollen on inside, Front tooth was chipped   Diabetes mellitus    19 years ago   Diabetic neuropathy ([place])    Diabetic retinopathy ([place])    Essential hypertension 12/23/2015   GERD (gastroesophageal reflux disease)    Headache    Migraine- a long time ago   Hypercholesteremia    Hyperlipidemia 12/23/2015   Hypertension    Muscle spasms of lower extremity    legs and weakness   Pneumonia    Restless leg    Type 1 diabetes mellitus ([place])    Vitreous hemorrhage, left eye ([place])    Past Surgical History:  Procedure Laterality Date   CARDIAC CATHETERIZATION N/A 12/27/2015   Procedure: Left Heart Cath and Coronary Angiography;  Surgeon: Troy Sine, MD;  Location: [facility];  Service:  Cardiovascular;  Laterality: N/A;   [place], 2002   DILATION AND CURETTAGE OF UTERUS  1997   GAS/FLUID EXCHANGE Right 08/28/2014   Procedure: GAS/FLUID EXCHANGE;  Surgeon: Hayden Pedro, MD;  Location: [facility];  Service: Ophthalmology;  Laterality: Right;   LASER PHOTO ABLATION     left x 2, right x 1   LASER PHOTO ABLATION Right 08/28/2014   Procedure: LASER PHOTO ABLATION;  Surgeon: Hayden Pedro, MD;  Location: [facility];  Service: Ophthalmology;  Laterality: Right;   MEMBRANE PEEL Right 08/28/2014   Procedure: MEMBRANE PEEL;  Surgeon: Hayden Pedro, MD;  Location: [facility];  Service: Ophthalmology;  Laterality: Right;   PARS PLANA VITRECTOMY Right 08/28/2014   Procedure: PARS PLANA VITRECTOMY WITH 25 GAUGE;  Surgeon: Hayden Pedro, MD;  Location: [facility];  Service: Ophthalmology;  Laterality: Right;   PARS PLANA VITRECTOMY Left 12/24/2014   Procedure: LEFT PARS PLANA VITRECTOMY WITH 25 GAUGE WITH ENDOLASER ;  Surgeon: Hurman Horn, MD;  Location: [facility];  Service: Ophthalmology;  Laterality: Left;   TUBAL LIGATION  2002   WISDOM TOOTH EXTRACTION     age 15   Current Outpatient Medications on File Prior to Visit  Medication Sig Dispense Refill   albuterol (PROAIR HFA) 108 (90 Base) MCG/ACT inhaler 2 PUFFS EVERY 6 HOURS AS NEEDED for shortness of breath (Patient taking differently: Inhale 2 puffs into the lungs every 6 (six) hours as needed for wheezing or shortness of breath. ) 3 each 3   albuterol (PROVENTIL) (2.5 MG/3ML) 0.083% nebulizer solution Take 3 mLs (2.5 mg total) by nebulization every 4 (four) hours as needed for wheezing or shortness of breath. 150 mL 1   aspirin EC 81 MG tablet Take 81 mg by mouth daily.     carvedilol (COREG) 25 MG tablet TAKE 1 TABLET BY MOUTH ONCE DAILY IN THE EVENING 30 tablet 0   dicyclomine (BENTYL) 20 MG tablet Take one pill every 6-8 hours for abd cramps 20 tablet 0   HUMALOG 100 UNIT/ML injection INJECT 3 TO 8 UNITS SUBCUTANEOUSLY THREE  TIMES DAILY WITH MEALS . APPOINTMENT REQUIRED FOR FUTURE REFILLS 10 mL 0   LANTUS SOLOSTAR 100 UNIT/ML Solostar Pen INJECT 35 UNITS SUBCUTANEOUSLY ONCE DAILY . APPOINTMENT REQUIRED FOR FUTURE REFILLS 15 mL 0   losartan (COZAAR) 100 MG tablet Take 1 tablet by mouth once daily 30 tablet 0   ondansetron (ZOFRAN ODT) 4 MG disintegrating tablet 4mg  ODT q4 hours prn nausea/vomit (Patient not taking: Reported on 04/05/2020) 12 tablet 0   ONETOUCH VERIO test strip USE AS DIRECTED 100 each 3   OVER THE COUNTER MEDICATION Take 2 tablets by mouth daily as needed. Restless leg relief      No current facility-administered medications on file prior to visit.   Allergies  Allergen Reactions   Fish-Derived Products Hives and Shortness Of Breath   Demerol Itching and Rash   Morphine And Related Itching and Rash        Zocor [Simvastatin] Itching and Other (See Comments)    Leg pain   Social History   Socioeconomic History   Marital status: Married    Spouse name: Not on file   Number of children: Not on file   Years of education: Not on file   Highest education level: Not on file  Occupational History   Not on file  Tobacco Use   Smoking status: Every Day    Packs/day: 1.00    Years: 9.00    Pack years: 9.00    Types: Cigarettes   Smokeless tobacco: Never  Substance and Sexual Activity   Alcohol use: No   Drug use: No   Sexual activity: Yes    Partners: Male    Birth control/protection: None, Surgical    Comment: tubal   Other Topics Concern   Not on file  Social History Narrative   Married, lives with spouse and 2 kids   Occupation: Chemical engineer   Social Determinants of Health   Financial Resource Strain: Not on file  Food Insecurity: Not on file  Transportation Needs: Not on file  Physical Activity: Not on file  Stress: Not on file  Social Connections: Not on file  Intimate Partner Violence: Not on file     Review of Systems  All other systems reviewed and are  negative.     Objective:   Physical Exam Vitals reviewed.  Constitutional:      Appearance: She is well-developed.  Neck:     Thyroid: No thyromegaly.  Cardiovascular:     Rate and Rhythm: Normal rate and regular rhythm.     Heart sounds: Normal heart sounds. No murmur heard. Pulmonary:     Effort:  Pulmonary effort is normal. No respiratory distress.     Breath sounds: No decreased breath sounds, wheezing, rhonchi or rales.  Abdominal:     General: Bowel sounds are normal. There is no distension.     Palpations: Abdomen is soft.     Tenderness: There is no abdominal tenderness. There is no guarding or rebound.  Musculoskeletal:     Cervical back: Neck supple.  Lymphadenopathy:     Cervical: No cervical adenopathy.          Assessment & Plan:  Proliferative diabetic retinopathy associated with type 1 diabetes mellitus, unspecified laterality, unspecified proliferative retinopathy type ([place]) - Plan: Ambulatory referral to Ophthalmology  Essential hypertension  Pure hypercholesterolemia  Tobacco use disorder  Diabetes mellitus type 1, controlled, insulin dependent ([place]) - Plan: CBC with Differential/Platelet, Lipid panel, COMPLETE METABOLIC PANEL WITH GFR, Microalbumin, urine, Hemoglobin A1c  Encounter for screening mammogram for malignant neoplasm of breast - Plan: MM Digital Screening  Colon cancer screening - Plan: Ambulatory referral to Gastroenterology I am concerned that her blood sugars are not well controlled.  I will check an A1c but I anticipate that he will be greater than 80.  If so, I would recommend up titrating the Lantus gradually until fasting blood sugars are under 100.  Once fasting blood sugars are under 100, I would uptitrate her mealtime Humalog until 2-hour postprandial sugars are under 100 and.  Blood pressures today are well controlled.  Continue to encourage smoking cessation.  Schedule patient for colonoscopy.  Schedule patient for mammogram.   Recommended a Pap smear but she politely deferred this at the present time.  Add Xyzal 5 mg daily for allergies.  If not beneficial and she continues to experience episodic wheezing I would recommend starting a medicine such as Symbicort COPD secondary to smoking

## 2021-04-05 LAB — CBC WITH DIFFERENTIAL/PLATELET
Absolute Monocytes: 592 cells/uL (ref 200–950)
Basophils Absolute: 17 cells/uL (ref 0–200)
Basophils Relative: 0.2 %
Eosinophils Absolute: 122 cells/uL (ref 15–500)
Eosinophils Relative: 1.4 %
HCT: 44.6 % (ref 35.0–45.0)
Hemoglobin: 15 g/dL (ref 11.7–15.5)
Lymphs Abs: 3323 cells/uL (ref 850–3900)
MCH: 30.9 pg (ref 27.0–33.0)
MCHC: 33.6 g/dL (ref 32.0–36.0)
MCV: 92 fL (ref 80.0–100.0)
MPV: 11.4 fL (ref 7.5–12.5)
Monocytes Relative: 6.8 %
Neutro Abs: 4646 cells/uL (ref 1500–7800)
Neutrophils Relative %: 53.4 %
Platelets: 184 10*3/uL (ref 140–400)
RBC: 4.85 10*6/uL (ref 3.80–5.10)
RDW: 11.7 % (ref 11.0–15.0)
Total Lymphocyte: 38.2 %
WBC: 8.7 10*3/uL (ref 3.8–10.8)

## 2021-04-05 LAB — COMPLETE METABOLIC PANEL WITH GFR
AG Ratio: 1.8 (calc) (ref 1.0–2.5)
ALT: 24 U/L (ref 6–29)
AST: 17 U/L (ref 10–35)
Albumin: 4.3 g/dL (ref 3.6–5.1)
Alkaline phosphatase (APISO): 61 U/L (ref 31–125)
BUN: 11 mg/dL (ref 7–25)
CO2: 27 mmol/L (ref 20–32)
Calcium: 9.5 mg/dL (ref 8.6–10.2)
Chloride: 108 mmol/L (ref 98–110)
Creat: 0.7 mg/dL (ref 0.50–0.99)
Globulin: 2.4 g/dL (calc) (ref 1.9–3.7)
Glucose, Bld: 114 mg/dL — ABNORMAL HIGH (ref 65–99)
Potassium: 4.3 mmol/L (ref 3.5–5.3)
Sodium: 143 mmol/L (ref 135–146)
Total Bilirubin: 0.5 mg/dL (ref 0.2–1.2)
Total Protein: 6.7 g/dL (ref 6.1–8.1)
eGFR: 107 mL/min/{1.73_m2} (ref >=60)

## 2021-04-05 LAB — LIPID PANEL
Cholesterol: 218 mg/dL — ABNORMAL HIGH (ref <200)
HDL: 35 mg/dL — ABNORMAL LOW (ref >=50)
LDL Cholesterol (Calc): 161 mg/dL (calc) — ABNORMAL HIGH
Non-HDL Cholesterol (Calc): 183 mg/dL (calc) — ABNORMAL HIGH (ref <130)
Total CHOL/HDL Ratio: 6.2 (calc) — ABNORMAL HIGH (ref <5.0)
Triglycerides: 104 mg/dL (ref <150)

## 2021-04-05 LAB — HEMOGLOBIN A1C
Hgb A1c MFr Bld: 8.6 % of total Hgb — ABNORMAL HIGH (ref <5.7)
Mean Plasma Glucose: 200 mg/dL
eAG (mmol/L): 11.1 mmol/L

## 2021-04-05 LAB — MICROALBUMIN, URINE: Microalb, Ur: 0.3 mg/dL

## 2021-04-07 ENCOUNTER — Telehealth: Payer: Self-pay

## 2021-04-07 NOTE — Telephone Encounter (Signed)
Left message for patient to call back to discuss results and recommendations.

## 2021-04-07 NOTE — Telephone Encounter (Signed)
-----   Message from Susy Frizzle, MD sent at 04/07/2021  7:02 AM EST ----- Cholesterol is very high, add zetia 10 mg a day.  A1c is 8.5, recommend increasing lantus until fasting sugars are under 130. Please have patient call me in 1 week with update of fbs and what dose of insulin she is taking.

## 2021-04-09 ENCOUNTER — Encounter: Payer: Self-pay | Admitting: Internal Medicine

## 2021-04-20 ENCOUNTER — Other Ambulatory Visit: Payer: Self-pay | Admitting: Family Medicine

## 2021-04-21 ENCOUNTER — Other Ambulatory Visit: Payer: Self-pay

## 2021-04-21 DIAGNOSIS — E119 Type 2 diabetes mellitus without complications: Secondary | ICD-10-CM

## 2021-04-21 MED ORDER — HUMALOG 100 UNIT/ML IJ SOLN: INTRAMUSCULAR | 3 refills | Status: DC

## 2021-04-21 MED ORDER — LANTUS SOLOSTAR 100 UNIT/ML ~~LOC~~ SOPN: PEN_INJECTOR | SUBCUTANEOUS | 3 refills | Status: DC

## 2021-04-21 NOTE — Telephone Encounter (Signed)
Please clarify titration instructions for pt's insulin: Lantus and Humalog.   Thank you!

## 2021-04-23 MED ORDER — LANTUS SOLOSTAR 100 UNIT/ML ~~LOC~~ SOPN: 35.0000 [IU] | PEN_INJECTOR | Freq: Every day | SUBCUTANEOUS | 3 refills | Status: DC

## 2021-04-24 ENCOUNTER — Other Ambulatory Visit: Payer: Self-pay

## 2021-04-24 MED ORDER — LANTUS SOLOSTAR 100 UNIT/ML ~~LOC~~ SOPN: 35.0000 [IU] | PEN_INJECTOR | Freq: Every day | SUBCUTANEOUS | 3 refills | Status: DC

## 2021-04-24 NOTE — Telephone Encounter (Signed)
This encounter was created in error - please disregard.

## 2021-05-04 ENCOUNTER — Other Ambulatory Visit: Payer: Self-pay | Admitting: Family Medicine

## 2021-05-22 ENCOUNTER — Ambulatory Visit (INDEPENDENT_AMBULATORY_CARE_PROVIDER_SITE_OTHER): Payer: PRIVATE HEALTH INSURANCE | Admitting: Ophthalmology

## 2021-05-22 ENCOUNTER — Encounter (INDEPENDENT_AMBULATORY_CARE_PROVIDER_SITE_OTHER): Payer: Self-pay | Admitting: Ophthalmology

## 2021-05-22 ENCOUNTER — Other Ambulatory Visit: Payer: Self-pay

## 2021-05-22 DIAGNOSIS — E103552 Type 1 diabetes mellitus with stable proliferative diabetic retinopathy, left eye: Secondary | ICD-10-CM | POA: Diagnosis not present

## 2021-05-22 DIAGNOSIS — E103551 Type 1 diabetes mellitus with stable proliferative diabetic retinopathy, right eye: Secondary | ICD-10-CM | POA: Diagnosis not present

## 2021-05-22 DIAGNOSIS — H2513 Age-related nuclear cataract, bilateral: Secondary | ICD-10-CM | POA: Diagnosis not present

## 2021-05-22 NOTE — Progress Notes (Signed)
05/22/2021     CHIEF COMPLAINT Patient presents for  Chief Complaint  Patient presents with   Diabetic Eye Exam      HISTORY OF PRESENT ILLNESS: [name] is a 46 y.o. female who presents to the clinic today for:   HPI     Diabetic Eye Exam           Vision: is blurred for near and is blurred for distance   Onset: Pt states she was diagnosed at 25.   Blood Sugars: is controlled   Last A1C: 6.7         Comments   NP (Last seen 2020) DEE, referred by Eitzen. Pt states she notices her vision is blurred noticing from her left eye. Pt states her BS is running high right now, because she is on prednisone. Pt states she is Type 1 Diabetic, chart states Type 2 and she states she was diagnosed at 18. Pt states she sees white "light" spots in the center of her vision.       Last edited by [name] on 05/22/2021  3:21 PM.      Referring physician: Susy Frizzle, MD [id number] [facility] [address] [employer],  [place] [address]  HISTORICAL INFORMATION:   Selected notes from the MEDICAL RECORD NUMBER    Lab Results  Component Value Date   HGBA1C 8.6 (H) 04/04/2021     CURRENT MEDICATIONS: No current outpatient medications on file. (Ophthalmic Drugs)   No current facility-administered medications for this visit. (Ophthalmic Drugs)   Current Outpatient Medications (Other)  Medication Sig   albuterol (PROAIR HFA) 108 (90 Base) MCG/ACT inhaler 2 PUFFS EVERY 6 HOURS AS NEEDED for shortness of breath (Patient taking differently: Inhale 2 puffs into the lungs every 6 (six) hours as needed for wheezing or shortness of breath.)   albuterol (PROVENTIL) (2.5 MG/3ML) 0.083% nebulizer solution Take 3 mLs (2.5 mg total) by nebulization every 4 (four) hours as needed for wheezing or shortness of breath.   aspirin EC 81 MG tablet Take 81 mg by mouth daily.   carvedilol (COREG) 25 MG tablet Take 1 tablet (25 mg total) by mouth daily at 6 (six) AM.   dicyclomine  (BENTYL) 20 MG tablet Take one pill every 6-8 hours for abd cramps   HUMALOG 100 UNIT/ML injection Titrating as directed   LANTUS SOLOSTAR 100 UNIT/ML Solostar Pen Inject 35 Units into the skin daily. Increase by 1 Unit each day until FBG is less than 130. Then hold at that dose.   levocetirizine (XYZAL ALLERGY 24HR) 5 MG tablet Take 1 tablet (5 mg total) by mouth every evening.   losartan (COZAAR) 100 MG tablet Take 1 tablet by mouth once daily   ondansetron (ZOFRAN ODT) 4 MG disintegrating tablet 4mg  ODT q4 hours prn nausea/vomit   ONETOUCH VERIO test strip USE AS DIRECTED   OVER THE COUNTER MEDICATION Take 2 tablets by mouth daily as needed. Restless leg relief    No current facility-administered medications for this visit. (Other)      REVIEW OF SYSTEMS:    ALLERGIES Allergies  Allergen Reactions   Fish-Derived Products Hives and Shortness Of Breath   Demerol Itching and Rash   Morphine And Related Itching and Rash        Zocor [Simvastatin] Itching and Other (See Comments)    Leg pain    PAST MEDICAL HISTORY Past Medical History:  Diagnosis Date   Asthma  Carpal tunnel syndrome on left    Complication of anesthesia 07/2014   Mouth was swollen on inside, Front tooth was chipped   Diabetes mellitus    19 years ago   Diabetic neuropathy ([name])    Diabetic retinopathy ([name])    Essential hypertension 12/23/2015   GERD (gastroesophageal reflux disease)    Headache    Migraine- a long time ago   Hypercholesteremia    Hyperlipidemia 12/23/2015   Hypertension    Muscle spasms of lower extremity    legs and weakness   Pneumonia    Restless leg    Type 1 diabetes mellitus ([name])    Vitreous hemorrhage ([name]) 08/08/2014   Vitreous hemorrhage of right eye ([name]) 08/28/2014   Vitreous hemorrhage, left eye ([name])    Past Surgical History:  Procedure Laterality Date   CARDIAC CATHETERIZATION N/A 12/27/2015   Procedure: Left Heart Cath and Coronary Angiography;  Surgeon: Troy Sine, MD;  Location: [facility];  Service: Cardiovascular;  Laterality: N/A;   [place], 2002   DILATION AND CURETTAGE OF UTERUS  1997   GAS/FLUID EXCHANGE Right 08/28/2014   Procedure: GAS/FLUID EXCHANGE;  Surgeon: Hayden Pedro, MD;  Location: [facility];  Service: Ophthalmology;  Laterality: Right;   LASER PHOTO ABLATION     left x 2, right x 1   LASER PHOTO ABLATION Right 08/28/2014   Procedure: LASER PHOTO ABLATION;  Surgeon: Hayden Pedro, MD;  Location: [facility];  Service: Ophthalmology;  Laterality: Right;   MEMBRANE PEEL Right 08/28/2014   Procedure: MEMBRANE PEEL;  Surgeon: Hayden Pedro, MD;  Location: [place];  Service: Ophthalmology;  Laterality: Right;   PARS PLANA VITRECTOMY Right 08/28/2014   Procedure: PARS PLANA VITRECTOMY WITH 25 GAUGE;  Surgeon: Hayden Pedro, MD;  Location: [name];  Service: Ophthalmology;  Laterality: Right;   PARS PLANA VITRECTOMY Left 12/24/2014   Procedure: LEFT PARS PLANA VITRECTOMY WITH 25 GAUGE WITH ENDOLASER ;  Surgeon: Hurman Horn, MD;  Location: [name];  Service: Ophthalmology;  Laterality: Left;   TUBAL LIGATION  2002   WISDOM TOOTH EXTRACTION     age 58    FAMILY HISTORY Family History  Problem Relation Age of Onset   Diabetes Mother    Parkinsonism Mother    Diabetes Father    Hypertension Father    Diabetes Brother    Heart failure Maternal Grandmother    Diabetes Maternal Grandmother     SOCIAL HISTORY Social History   Tobacco Use   Smoking status: Every Day    Packs/day: 2.00    Years: 9.00    Pack years: 18.00    Types: Cigarettes   Smokeless tobacco: Never  Substance Use Topics   Alcohol use: No   Drug use: No         OPHTHALMIC EXAM:  Base Eye Exam     Visual Acuity (ETDRS)       Right Left   Dist cc CF at 3' 20/30   Dist ph cc NI     Correction: Glasses         Tonometry (Tonopen, 3:18 PM)       Right Left   Pressure 19 27         Pupils       Pupils Dark Light  APD   Right PERRL 5 5 None   Left PERRL 5 5 None  Visual Fields (Counting fingers)       Left Right    Full    Restrictions  Partial outer superior temporal deficiency         Extraocular Movement       Right Left    Full Full         Neuro/Psych     Oriented x3: Yes   Mood/Affect: Normal         Dilation     Both eyes: 1.0% Mydriacyl, 2.5% Phenylephrine @ 3:18 PM           Slit Lamp and Fundus Exam     External Exam       Right Left   External Normal Normal         Slit Lamp Exam       Right Left   Lids/Lashes Normal Normal   Conjunctiva/Sclera White and quiet White and quiet   Cornea Clear Clear   Anterior Chamber Deep and quiet Deep and quiet   Iris Round and reactive Round and reactive   Lens 2+ Nuclear sclerosis, central oil droplets change 2+ Nuclear sclerosis, central oral droplet change   Anterior Vitreous Normal Normal         Fundus Exam       Right Left   Posterior Vitreous Clear vitrectomized Clear vitrectomized   Disc Slight temporal pallor, superiorly Normal   C/D Ratio 0.5 0.5   Macula No obvious macular edema no exudate No obvious macular edema no exudate   Vessels PDR-quiet PDR-quiet   Periphery Good PRP 360 tight to the posterior pole Good PRP 360 tight to the posterior pole            IMAGING AND PROCEDURES  Imaging and Procedures for 05/22/21  OCT, Retina - OU - Both Eyes       Right Eye Quality was good. Scan locations included subfoveal. Central Foveal Thickness: 238. Findings include abnormal foveal contour.   Left Eye Quality was good. Scan locations included subfoveal. Central Foveal Thickness: 276. Findings include abnormal foveal contour.   Notes OD with atrophy superior portion of the macula, antral macular anatomy preserved, yet optic atrophy may still limit acuity OD  OS similarly inferior macula is undergone atrophy but overall stable compared to 2019 last visit.  No active  maculopathy     Color Fundus Photography Optos - OU - Both Eyes       Right Eye Progression has been stable. Disc findings include increased cup to disc ratio, pallor, thinning of rim. Macula : microaneurysms.   Left Eye Progression has been stable. Disc findings include increased cup to disc ratio, pallor, thinning of rim. Macula : microaneurysms.   Notes Bilateral PDR, quiescent with good PRP, resolution of small hemorrhage inferiorly as compared to 2016             ASSESSMENT/PLAN:  Nuclear sclerotic cataract of both eyes Will suggest consideration of cataract extraction with intraocular lens placement right eye for so as to familiarize the patient with the process to undergo for maximal visual acuity restoration     ICD-10-CM   1. Stable treated proliferative diabetic retinopathy of right eye determined by examination associated with type 1 diabetes mellitus ([place])  E10.3551 OCT, Retina - OU - Both Eyes    Color Fundus Photography Optos - OU - Both Eyes    2. Stable treated proliferative diabetic retinopathy of left eye without macular edema determined by examination associated with type 1 diabetes  mellitus ([place])  E10.3552 OCT, Retina - OU - Both Eyes    Color Fundus Photography Optos - OU - Both Eyes    3. Nuclear sclerotic cataract of both eyes  H25.13       1.  OU follow-up for macular perfusion study in each eye with fundus fluorescein angiography.  To be a predictor as to how well visual acuity restoration may proceed with cataract extraction with lens implantation.  2.  She currently has nighttime vision difficulties already.  3.  Ophthalmic Meds Ordered this visit:  No orders of the defined types were placed in this encounter.      Return in about 2 weeks (around 06/05/2021) for DILATE OU, COLOR FP, OPTOS FFA R/L, OD, OS.  There are no Patient Instructions on file for this visit.   Explained the diagnoses, plan, and follow up with the patient and they  expressed understanding.  Patient expressed understanding of the importance of proper follow up care.   Clent Demark Antawn Sison M.D. Diseases & Surgery of the Retina and Vitreous Retina & Diabetic [place] 05/22/21     Abbreviations: M myopia (nearsighted); A astigmatism; H hyperopia (farsighted); P presbyopia; Mrx spectacle prescription;  CTL contact lenses; OD right eye; OS left eye; OU both eyes  XT exotropia; ET esotropia; PEK punctate epithelial keratitis; PEE punctate epithelial erosions; DES dry eye syndrome; MGD meibomian gland dysfunction; ATs artificial tears; PFAT's preservative free artificial tears; [place] nuclear sclerotic cataract; PSC posterior subcapsular cataract; ERM epi-retinal membrane; PVD posterior vitreous detachment; RD retinal detachment; DM diabetes mellitus; DR diabetic retinopathy; NPDR non-proliferative diabetic retinopathy; PDR proliferative diabetic retinopathy; CSME clinically significant macular edema; DME diabetic macular edema; dbh dot blot hemorrhages; CWS cotton wool spot; POAG primary open angle glaucoma; C/D cup-to-disc ratio; HVF humphrey visual field; GVF goldmann visual field; OCT optical coherence tomography; IOP intraocular pressure; BRVO Branch retinal vein occlusion; CRVO central retinal vein occlusion; CRAO central retinal artery occlusion; BRAO branch retinal artery occlusion; RT retinal tear; SB scleral buckle; PPV pars plana vitrectomy; VH Vitreous hemorrhage; PRP panretinal laser photocoagulation; IVK intravitreal kenalog; VMT vitreomacular traction; MH Macular hole;  NVD neovascularization of the disc; NVE neovascularization elsewhere; AREDS age related eye disease study; ARMD age related macular degeneration; POAG primary open angle glaucoma; EBMD epithelial/anterior basement membrane dystrophy; ACIOL anterior chamber intraocular lens; IOL intraocular lens; PCIOL posterior chamber intraocular lens; Phaco/IOL phacoemulsification with intraocular lens placement;  [place] photorefractive keratectomy; LASIK laser assisted in situ keratomileusis; HTN hypertension; DM diabetes mellitus; COPD chronic obstructive pulmonary disease

## 2021-05-22 NOTE — Assessment & Plan Note (Signed)
Will suggest consideration of cataract extraction with intraocular lens placement right eye for so as to familiarize the patient with the process to undergo for maximal visual acuity restoration

## 2021-05-27 ENCOUNTER — Ambulatory Visit (INDEPENDENT_AMBULATORY_CARE_PROVIDER_SITE_OTHER): Payer: Self-pay | Admitting: *Deleted

## 2021-05-27 ENCOUNTER — Other Ambulatory Visit: Payer: Self-pay

## 2021-05-27 VITALS — Ht 63.0 in | Wt 170.0 lb

## 2021-05-27 DIAGNOSIS — Z1211 Encounter for screening for malignant neoplasm of colon: Secondary | ICD-10-CM

## 2021-05-27 NOTE — Progress Notes (Addendum)
Gastroenterology Pre-Procedure Review  Request Date: 05/27/2021 Requesting Physician: Dr. Jenna Luo @ [place], no previous TCS  PATIENT REVIEW QUESTIONS: The patient responded to the following health history questions as indicated:    1. Diabetes Melitis: yes, type I 2. Joint replacements in the past 12 months: no 3. Major health problems in the past 3 months: no 4. Has an artificial valve or MVP: no 5. Has a defibrillator: no 6. Has been advised in past to take antibiotics in advance of a procedure like teeth cleaning: no 7. Family history of colon cancer: no  8. Alcohol Use: no 9. Illicit drug Use: no 10. History of sleep apnea: no  11. History of coronary artery or other vascular stents placed within the last 12 months: no 12. History of any prior anesthesia complications: no 13. Body mass index is 30.11 kg/m.    MEDICATIONS & ALLERGIES:    Patient reports the following regarding taking any blood thinners:   Plavix? no Aspirin? Yes, 81 mg daily Coumadin? no Brilinta? no Xarelto? no Eliquis? no Pradaxa? no Savaysa? no Effient? no  Patient confirms/reports the following medications:  Current Outpatient Medications  Medication Sig Dispense Refill   albuterol (PROAIR HFA) 108 (90 Base) MCG/ACT inhaler 2 PUFFS EVERY 6 HOURS AS NEEDED for shortness of breath (Patient taking differently: Inhale 2 puffs into the lungs every 6 (six) hours as needed for wheezing or shortness of breath.) 3 each 3   albuterol (PROVENTIL) (2.5 MG/3ML) 0.083% nebulizer solution Take 3 mLs (2.5 mg total) by nebulization every 4 (four) hours as needed for wheezing or shortness of breath. 150 mL 1   aspirin EC 81 MG tablet Take 81 mg by mouth daily.     carvedilol (COREG) 25 MG tablet Take 1 tablet (25 mg total) by mouth daily at 6 (six) AM. 90 tablet 1   HUMALOG 100 UNIT/ML injection Titrating as directed (Patient taking differently: Titrating as directed.) 60 mL 3   LANTUS  SOLOSTAR 100 UNIT/ML Solostar Pen Inject 35 Units into the skin daily. Increase by 1 Unit each day until FBG is less than 130. Then hold at that dose. (Patient taking differently: Inject 45 Units into the skin daily.) 45 mL 3   losartan (COZAAR) 100 MG tablet Take 1 tablet by mouth once daily 90 tablet 1   OVER THE COUNTER MEDICATION Take 2 tablets by mouth daily as needed. Restless leg relief      No current facility-administered medications for this visit.    Patient confirms/reports the following allergies:  Allergies  Allergen Reactions   Fish-Derived Products Hives and Shortness Of Breath   Demerol Itching and Rash   Morphine And Related Itching and Rash        Zocor [Simvastatin] Itching and Other (See Comments)    Leg pain    No orders of the defined types were placed in this encounter.   AUTHORIZATION INFORMATION Primary Insurance: [employer],  ID #: [id number],  Group #: [id number] Pre-Cert / Josem Kaufmann required: No, not required per Leveda Anna / Auth #: REF: Ulice Dash 06/04/2021  Secondary Insurance: [facility],  [place] #: [id number],  Group #: [id number] Pre-Cert / Auth required: Yes, approved online [id number]/11/[id number]/[id number] Pre-Cert / Josem Kaufmann #: [id number]  SCHEDULE INFORMATION: Procedure has been scheduled as follows:  Date: 06/16/2021 , Time: 12:45 Location: [facility] with Dr. Abbey Chatters  This Gastroenterology Pre-Precedure Review Form is being routed to the following provider(s): Roseanne Kaufman, NP

## 2021-06-02 NOTE — Progress Notes (Signed)
ASA 2. Appropriate. 1/2 dose of Lantus evening prior.

## 2021-06-03 ENCOUNTER — Encounter: Payer: Self-pay | Admitting: *Deleted

## 2021-06-03 ENCOUNTER — Other Ambulatory Visit: Payer: Self-pay | Admitting: *Deleted

## 2021-06-03 DIAGNOSIS — Z1211 Encounter for screening for malignant neoplasm of colon: Secondary | ICD-10-CM

## 2021-06-03 MED ORDER — NA SULFATE-K SULFATE-MG SULF 17.5-3.13-1.6 GM/177ML PO SOLN: 1.0000 | Freq: Once | ORAL | 0 refills | Status: AC

## 2021-06-03 NOTE — Progress Notes (Signed)
Spoke to pt.  Scheduled procedure for 06/16/2021 at 12:45, arrival at 11:15 at [facility].  Reviewed prep instructions with pt.  Pt made aware to have labs drawn on 06/13/2021 at [facility].  Pt advised on how to adjust her medications for procedure.  Informed pt RX sent to pharmacy for prep kit.  Pt aware that I am mailing out prep instructions.  Confirmed mailing address.

## 2021-06-03 NOTE — Addendum Note (Signed)
Addended by: Metro Kung on: 06/03/2021 12:34 PM   Modules accepted: Orders

## 2021-06-03 NOTE — Progress Notes (Signed)
Tried to call pt on home number.  VM full and could not accept messages.  Tried to call pt on cell but got disconnected x 2 times.  Unable to leave a message.

## 2021-06-04 ENCOUNTER — Other Ambulatory Visit: Payer: Self-pay | Admitting: *Deleted

## 2021-06-04 NOTE — Progress Notes (Signed)
Spoke to [employer] at [facility].  Informed that colonoscopies are covered starting at age 48.  REF#: [id number]  Spoke to [place] at [employer] and was informed that colonoscopies are covered starting at age 38.  Ref#: [id number]

## 2021-06-05 ENCOUNTER — Other Ambulatory Visit: Payer: Self-pay

## 2021-06-05 ENCOUNTER — Encounter (INDEPENDENT_AMBULATORY_CARE_PROVIDER_SITE_OTHER): Payer: Self-pay | Admitting: Ophthalmology

## 2021-06-05 ENCOUNTER — Ambulatory Visit (INDEPENDENT_AMBULATORY_CARE_PROVIDER_SITE_OTHER): Payer: PRIVATE HEALTH INSURANCE | Admitting: Ophthalmology

## 2021-06-05 DIAGNOSIS — E103551 Type 1 diabetes mellitus with stable proliferative diabetic retinopathy, right eye: Secondary | ICD-10-CM | POA: Diagnosis not present

## 2021-06-05 DIAGNOSIS — H2513 Age-related nuclear cataract, bilateral: Secondary | ICD-10-CM | POA: Diagnosis not present

## 2021-06-05 DIAGNOSIS — E103552 Type 1 diabetes mellitus with stable proliferative diabetic retinopathy, left eye: Secondary | ICD-10-CM | POA: Diagnosis not present

## 2021-06-05 MED ORDER — FLUORESCEIN SODIUM 10 % IV SOLN
500.0000 mg | INTRAVENOUS | Status: AC | PRN
  Administered 2021-06-05: 17:00:00 500 mg via INTRAVENOUS

## 2021-06-05 NOTE — Assessment & Plan Note (Signed)
Quiescent with no active maculopathy, possible ischemic maculopathy as noted by atrophy temporal aspect of the macula on OCT ?

## 2021-06-05 NOTE — Assessment & Plan Note (Signed)
OS intact no active leakage ?

## 2021-06-05 NOTE — Assessment & Plan Note (Signed)
OS, likely accounts for acuity and difficulties with oral droplet type cataract progression post vitrectomy classically seen ? ?OD likely no impact from the cataract on visual acuity as either optic atrophy or macular nonperfusion was suspected.  Today's FFA does disclose that small vessel disease in the macula is not demonstrable.  There is no overt ischemic maculopathy. ?

## 2021-06-05 NOTE — Progress Notes (Signed)
06/05/2021     CHIEF COMPLAINT Patient presents for  Chief Complaint  Patient presents with   Retina Follow Up      HISTORY OF PRESENT ILLNESS: [name] is a 46 y.o. female who presents to the clinic today for:   HPI     Retina Follow Up           Diagnosis: Other         Comments   2 weeks for dilate ou, color fp, optos ffa R/L OD AND OS PT states vision is about the same OU.  Pt denies floaters and FOL. No changes in medical history.       Last edited by Silvestre Moment on 06/05/2021  4:11 PM.      Referring physician: Susy Frizzle, MD [address] [facility] [address] [place],  [place] [phone]  HISTORICAL INFORMATION:   Selected notes from the MEDICAL RECORD NUMBER    Lab Results  Component Value Date   HGBA1C 8.6 (H) 04/04/2021     CURRENT MEDICATIONS: No current outpatient medications on file. (Ophthalmic Drugs)   No current facility-administered medications for this visit. (Ophthalmic Drugs)   Current Outpatient Medications (Other)  Medication Sig   albuterol (PROAIR HFA) 108 (90 Base) MCG/ACT inhaler 2 PUFFS EVERY 6 HOURS AS NEEDED for shortness of breath (Patient taking differently: Inhale 2 puffs into the lungs every 6 (six) hours as needed for wheezing or shortness of breath.)   albuterol (PROVENTIL) (2.5 MG/3ML) 0.083% nebulizer solution Take 3 mLs (2.5 mg total) by nebulization every 4 (four) hours as needed for wheezing or shortness of breath.   aspirin EC 81 MG tablet Take 81 mg by mouth daily.   carvedilol (COREG) 25 MG tablet Take 1 tablet (25 mg total) by mouth daily at 6 (six) AM.   HUMALOG 100 UNIT/ML injection Titrating as directed (Patient taking differently: Titrating as directed.)   LANTUS SOLOSTAR 100 UNIT/ML Solostar Pen Inject 35 Units into the skin daily. Increase by 1 Unit each day until FBG is less than 130. Then hold at that dose. (Patient taking differently: Inject 45 Units into the skin daily.)   losartan (COZAAR) 100 MG tablet Take  1 tablet by mouth once daily   OVER THE COUNTER MEDICATION Take 2 tablets by mouth daily as needed. Restless leg relief    No current facility-administered medications for this visit. (Other)      REVIEW OF SYSTEMS: ROS   Negative for: Constitutional, Gastrointestinal, Neurological, Skin, Genitourinary, Musculoskeletal, HENT, Endocrine, Cardiovascular, Eyes, Respiratory, Psychiatric, Allergic/Imm, Heme/Lymph Last edited by Silvestre Moment on 06/05/2021  4:11 PM.       ALLERGIES Allergies  Allergen Reactions   Fish-Derived Products Hives and Shortness Of Breath   Demerol Itching and Rash   Morphine And Related Itching and Rash        Zocor [Simvastatin] Itching and Other (See Comments)    Leg pain    PAST MEDICAL HISTORY Past Medical History:  Diagnosis Date   Asthma    Carpal tunnel syndrome on left    Complication of anesthesia 07/2014   Mouth was swollen on inside, Front tooth was chipped   Diabetes mellitus    19 years ago   Diabetic neuropathy ([place])    Diabetic retinopathy ([place])    Essential hypertension 12/23/2015   GERD (gastroesophageal reflux disease)    Headache    Migraine- a long time ago   Hypercholesteremia    Hyperlipidemia 12/23/2015  Hypertension    Muscle spasms of lower extremity    legs and weakness   Pneumonia    Restless leg    Type 1 diabetes mellitus ([place])    Vitreous hemorrhage ([place]) 08/08/2014   Vitreous hemorrhage of right eye ([place]) 08/28/2014   Vitreous hemorrhage, left eye ([place])    Past Surgical History:  Procedure Laterality Date   CARDIAC CATHETERIZATION N/A 12/27/2015   Procedure: Left Heart Cath and Coronary Angiography;  Surgeon: Troy Sine, MD;  Location: [facility];  Service: Cardiovascular;  Laterality: N/A;   [place], 2002   DILATION AND CURETTAGE OF UTERUS  1997   GAS/FLUID EXCHANGE Right 08/28/2014   Procedure: GAS/FLUID EXCHANGE;  Surgeon: Hayden Pedro, MD;  Location: [place];  Service:  Ophthalmology;  Laterality: Right;   LASER PHOTO ABLATION     left x 2, right x 1   LASER PHOTO ABLATION Right 08/28/2014   Procedure: LASER PHOTO ABLATION;  Surgeon: Hayden Pedro, MD;  Location: [place];  Service: Ophthalmology;  Laterality: Right;   MEMBRANE PEEL Right 08/28/2014   Procedure: MEMBRANE PEEL;  Surgeon: Hayden Pedro, MD;  Location: [place];  Service: Ophthalmology;  Laterality: Right;   PARS PLANA VITRECTOMY Right 08/28/2014   Procedure: PARS PLANA VITRECTOMY WITH 25 GAUGE;  Surgeon: Hayden Pedro, MD;  Location: [place];  Service: Ophthalmology;  Laterality: Right;   PARS PLANA VITRECTOMY Left 12/24/2014   Procedure: LEFT PARS PLANA VITRECTOMY WITH 25 GAUGE WITH ENDOLASER ;  Surgeon: Hurman Horn, MD;  Location: [place];  Service: Ophthalmology;  Laterality: Left;   TUBAL LIGATION  2002   WISDOM TOOTH EXTRACTION     age 39    FAMILY HISTORY Family History  Problem Relation Age of Onset   Diabetes Mother    Parkinsonism Mother    Diabetes Father    Hypertension Father    Diabetes Brother    Heart failure Maternal Grandmother    Diabetes Maternal Grandmother     SOCIAL HISTORY Social History   Tobacco Use   Smoking status: Every Day    Packs/day: 2.00    Years: 9.00    Pack years: 18.00    Types: Cigarettes   Smokeless tobacco: Never  Substance Use Topics   Alcohol use: No   Drug use: No         OPHTHALMIC EXAM:  Base Eye Exam     Visual Acuity (ETDRS)       Right Left   Dist cc CF at 2' 20/25   Dist ph cc  NI    Correction: Glasses         Tonometry (Tonopen, 4:18 PM)       Right Left   Pressure 26 20         Pupils       Pupils Dark Light Shape React APD   Right PERRL 5 4 Round Brisk None   Left PERRL 5 4 Round Brisk None         Visual Fields       Left Right    Full Full         Extraocular Movement       Right Left    Full Full         Neuro/Psych     Oriented x3: Yes   Mood/Affect: Normal          Dilation     Both  eyes: 1.0% Mydriacyl, 2.5% Phenylephrine @ 4:18 PM           Slit Lamp and Fundus Exam     External Exam       Right Left   External Normal Normal         Slit Lamp Exam       Right Left   Lids/Lashes Normal Normal   Conjunctiva/Sclera White and quiet White and quiet   Cornea Clear Clear   Anterior Chamber Deep and quiet Deep and quiet   Iris Round and reactive Round and reactive   Lens 2+ Nuclear sclerosis, central oil droplets change 2+ Nuclear sclerosis, central oral droplet change   Anterior Vitreous Normal Normal         Fundus Exam       Right Left   Posterior Vitreous Clear vitrectomized Clear vitrectomized   Disc Slight temporal pallor, superiorly Normal   C/D Ratio 0.5 0.5   Macula No obvious macular edema no exudate No obvious macular edema no exudate   Vessels PDR-quiet PDR-quiet   Periphery Good PRP 360 tight to the posterior pole Good PRP 360 tight to the posterior pole            IMAGING AND PROCEDURES  Imaging and Procedures for 06/05/21  Color Fundus Photography Optos - OU - Both Eyes       Right Eye Progression has been stable. Disc findings include increased cup to disc ratio, pallor, thinning of rim. Macula : microaneurysms.   Left Eye Progression has been stable. Disc findings include increased cup to disc ratio, pallor, thinning of rim. Macula : microaneurysms.   Notes Bilateral PDR, quiescent with good PRP, resolution of small hemorrhage inferiorly as compared to 2016     Fluorescein Angiography Optos (Transit OD)       Injection: 500 mg Fluorescein Sodium 10 %   Route: Intravenous   NDC: 1914-7829-56   Right Eye   Early phase findings include microaneurysm. Mid/Late phase findings include vascular perfusion defect. Choroidal neovascularization is not present.   Left Eye Mid/Late phase findings include microaneurysm. Choroidal neovascularization is not present.   Notes No obvious ischemic  maculopathy seen on fluorescein angiography in fact in the region temporal portion of the macula there is intact large vessel Perfusion  OS normal macular perfusion             ASSESSMENT/PLAN:  Stable treated proliferative diabetic retinopathy of right eye determined by examination associated with type 1 diabetes mellitus ([place]) Quiescent with no active maculopathy, possible ischemic maculopathy as noted by atrophy temporal aspect of the macula on OCT  Stable treated proliferative diabetic retinopathy of left eye without macular edema determined by examination associated with type 1 diabetes mellitus (HCC) OS intact no active leakage  Nuclear sclerotic cataract of both eyes OS, likely accounts for acuity and difficulties with oral droplet type cataract progression post vitrectomy classically seen  OD likely no impact from the cataract on visual acuity as either optic atrophy or macular nonperfusion was suspected.  Today's FFA does disclose that small vessel disease in the macula is not demonstrable.  There is no overt ischemic maculopathy.     ICD-10-CM   1. Stable treated proliferative diabetic retinopathy of right eye determined by examination associated with type 1 diabetes mellitus (HCC)  O13.0865 Color Fundus Photography Optos - OU - Both Eyes    Fluorescein Angiography Optos (Transit OD)    Fluorescein Sodium 10 % injection 500 mg  2. Stable treated proliferative diabetic retinopathy of left eye without macular edema determined by examination associated with type 1 diabetes mellitus ([place])  H41.9379     3. Nuclear sclerotic cataract of both eyes  H25.13       1.  2.  3.  Ophthalmic Meds Ordered this visit:  Meds ordered this encounter  Medications   Fluorescein Sodium 10 % injection 500 mg       Return in about 9 months (around 03/07/2022), or , Okay to proceed with cataract extraction with intraocular lens implantation evaluate, for DILATE OU, COLOR FP,  OCT.  There are no Patient Instructions on file for this visit.   Explained the diagnoses, plan, and follow up with the patient and they expressed understanding.  Patient expressed understanding of the importance of proper follow up care.   Clent Demark Lesle Faron M.D. Diseases & Surgery of the Retina and Vitreous Retina [facility] Diabetic [place] 06/05/21     Abbreviations: M myopia (nearsighted); A astigmatism; H hyperopia (farsighted); P presbyopia; Mrx spectacle prescription;  CTL contact lenses; OD right eye; OS left eye; OU both eyes  XT exotropia; ET esotropia; PEK punctate epithelial keratitis; PEE punctate epithelial erosions; DES dry eye syndrome; MGD meibomian gland dysfunction; ATs artificial tears; PFAT's preservative free artificial tears; [place] nuclear sclerotic cataract; PSC posterior subcapsular cataract; ERM epi-retinal membrane; PVD posterior vitreous detachment; RD retinal detachment; DM diabetes mellitus; DR diabetic retinopathy; NPDR non-proliferative diabetic retinopathy; PDR proliferative diabetic retinopathy; CSME clinically significant macular edema; DME diabetic macular edema; dbh dot blot hemorrhages; CWS cotton wool spot; POAG primary open angle glaucoma; C/D cup-to-disc ratio; HVF humphrey visual field; GVF goldmann visual field; OCT optical coherence tomography; IOP intraocular pressure; BRVO Branch retinal vein occlusion; CRVO central retinal vein occlusion; CRAO central retinal artery occlusion; BRAO branch retinal artery occlusion; RT retinal tear; SB scleral buckle; PPV pars plana vitrectomy; VH Vitreous hemorrhage; PRP panretinal laser photocoagulation; IVK intravitreal kenalog; VMT vitreomacular traction; MH Macular hole;  NVD neovascularization of the disc; NVE neovascularization elsewhere; AREDS age related eye disease study; ARMD age related macular degeneration; POAG primary open angle glaucoma; EBMD epithelial/anterior basement membrane dystrophy; ACIOL anterior chamber  intraocular lens; IOL intraocular lens; PCIOL posterior chamber intraocular lens; Phaco/IOL phacoemulsification with intraocular lens placement; [place] photorefractive keratectomy; LASIK laser assisted in situ keratomileusis; HTN hypertension; DM diabetes mellitus; COPD chronic obstructive pulmonary disease

## 2021-06-10 NOTE — Patient Instructions (Signed)
? ? ? ? ? ? [name] ? 06/10/2021  ?  ? '@PREFPERIOPPHARMACY'$ @ ? ? Your procedure is scheduled on  06/16/2021. ? ? Report to [facility] at  1045  A.M. ? Call this number if you have problems the morning of surgery: ? [phone] ? ? Remember: ? Follow the diet and prep instructions given to you by the office. ? ?Take 17.5 units of insulin the night before your procedure. ? ?Use your nebulizer and your inhaler before you come and bring your rescue inhaler with you. ?  ? Take these medicines the morning of surgery with A SIP OF WATER  ? ?carvedilol. ?  ? Do not wear jewelry, make-up or nail polish. ? Do not wear lotions, powders, or perfumes, or deodorant. ? Do not shave 48 hours prior to surgery.  Men may shave face and neck. ? Do not bring valuables to the hospital. ? [facility] is not responsible for any belongings or valuables. ? ?Contacts, dentures or bridgework may not be worn into surgery.  Leave your suitcase in the car.  After surgery it may be brought to your room. ? ?For patients admitted to the hospital, discharge time will be determined by your treatment team. ? ?Patients discharged the day of surgery will not be allowed to drive home and must have someone with them for 24 hours.  ? ? ?Special instructions:  DO NOT smoke tobacco or vape for 24 hours before your procedure. ? ?Please read over the following fact sheets that you were given. ?Anesthesia Post-op Instructions and Care and Recovery After Surgery ?  ? ? ? Colonoscopy, Adult, Care After ?This sheet gives you information about how to care for yourself after your procedure. Your health care provider may also give you more specific instructions. If you have problems or questions, contact your health care provider. ?What can I expect after the procedure? ?After the procedure, it is common to have: ?A small amount of blood in your stool for 24 hours after the procedure. ?Some gas. ?Mild cramping or bloating of your abdomen. ?Follow these  instructions at home: ?Eating and drinking ? ?Drink enough fluid to keep your urine pale yellow. ?Follow instructions from your health care provider about eating or drinking restrictions. ?Resume your normal diet as instructed by your health care provider. Avoid heavy or fried foods that are hard to digest. ?Activity ?Rest as told by your health care provider. ?Avoid sitting for a long time without moving. Get up to take short walks every 1-2 hours. This is important to improve blood flow and breathing. Ask for help if you feel weak or unsteady. ?Return to your normal activities as told by your health care provider. Ask your health care provider what activities are safe for you. ?Managing cramping and bloating ? ?Try walking around when you have cramps or feel bloated. ?Apply heat to your abdomen as told by your health care provider. Use the heat source that your health care provider recommends, such as a moist heat pack or a heating pad. ?Place a towel between your skin and the heat source. ?Leave the heat on for 20-30 minutes. ?Remove the heat if your skin turns bright red. This is especially important if you are unable to feel pain, heat, or cold. You may have a greater risk of getting burned. ?General instructions ?If you were given a sedative during the procedure, it can affect you for several hours. Do not drive or operate machinery until your health care provider  says that it is safe. ?For the first 24 hours after the procedure: ?Do not sign important documents. ?Do not drink alcohol. ?Do your regular daily activities at a slower pace than normal. ?Eat soft foods that are easy to digest. ?Take over-the-counter and prescription medicines only as told by your health care provider. ?Keep all follow-up visits as told by your health care provider. This is important. ?Contact a health care provider if: ?You have blood in your stool 2-3 days after the procedure. ?Get help right away if you have: ?More than a small  spotting of blood in your stool. ?Large blood clots in your stool. ?Swelling of your abdomen. ?Nausea or vomiting. ?A fever. ?Increasing pain in your abdomen that is not relieved with medicine. ?Summary ?After the procedure, it is common to have a small amount of blood in your stool. You may also have mild cramping and bloating of your abdomen. ?If you were given a sedative during the procedure, it can affect you for several hours. Do not drive or operate machinery until your health care provider says that it is safe. ?Get help right away if you have a lot of blood in your stool, nausea or vomiting, a fever, or increased pain in your abdomen. ?This information is not intended to replace advice given to you by your health care provider. Make sure you discuss any questions you have with your health care provider. ?Document Revised: 01/20/2019 Document Reviewed: 10/10/2018 ?Elsevier Patient Education ? [place]. ?Monitored Anesthesia Care, Care After ?This sheet gives you information about how to care for yourself after your procedure. Your health care provider may also give you more specific instructions. If you have problems or questions, contact your health care provider. ?What can I expect after the procedure? ?After the procedure, it is common to have: ?Tiredness. ?Forgetfulness about what happened after the procedure. ?Impaired judgment for important decisions. ?Nausea or vomiting. ?Some difficulty with balance. ?Follow these instructions at home: ?For the time period you were told by your health care provider: ?  ?Rest as needed. ?Do not participate in activities where you could fall or become injured. ?Do not drive or use machinery. ?Do not drink alcohol. ?Do not take sleeping pills or medicines that cause drowsiness. ?Do not make important decisions or sign legal documents. ?Do not take care of children on your own. ?Eating and drinking ?Follow the diet that is recommended by your health care  provider. ?Drink enough fluid to keep your urine pale yellow. ?If you vomit: ?Drink water, juice, or soup when you can drink without vomiting. ?Make sure you have little or no nausea before eating solid foods. ?General instructions ?Have a responsible adult stay with you for the time you are told. It is important to have someone help care for you until you are awake and alert. ?Take over-the-counter and prescription medicines only as told by your health care provider. ?If you have sleep apnea, surgery and certain medicines can increase your risk for breathing problems. Follow instructions from your health care provider about wearing your sleep device: ?Anytime you are sleeping, including during daytime naps. ?While taking prescription pain medicines, sleeping medicines, or medicines that make you drowsy. ?Avoid smoking. ?Keep all follow-up visits as told by your health care provider. This is important. ?Contact a health care provider if: ?You keep feeling nauseous or you keep vomiting. ?You feel light-headed. ?You are still sleepy or having trouble with balance after 24 hours. ?You develop a rash. ?You have a fever. ?  You have redness or swelling around the IV site. ?Get help right away if: ?You have trouble breathing. ?You have new-onset confusion at home. ?Summary ?For several hours after your procedure, you may feel tired. You may also be forgetful and have poor judgment. ?Have a responsible adult stay with you for the time you are told. It is important to have someone help care for you until you are awake and alert. ?Rest as told. Do not drive or operate machinery. Do not drink alcohol or take sleeping pills. ?Get help right away if you have trouble breathing, or if you suddenly become confused. ?This information is not intended to replace advice given to you by your health care provider. Make sure you discuss any questions you have with your health care provider. ?Document Revised: 11/30/2019 Document Reviewed:  02/16/2019 ?Elsevier Patient Education ? [place]. ? ?

## 2021-06-12 ENCOUNTER — Encounter (HOSPITAL_COMMUNITY)
Admission: RE | Admit: 2021-06-12 | Discharge: 2021-06-12 | Disposition: A | Discharge status: Home / Self Care | Payer: PRIVATE HEALTH INSURANCE | Source: Ambulatory Visit | Attending: Internal Medicine | Admitting: Internal Medicine

## 2021-06-12 DIAGNOSIS — Z01818 Encounter for other preprocedural examination: Secondary | ICD-10-CM

## 2021-06-13 ENCOUNTER — Other Ambulatory Visit (HOSPITAL_COMMUNITY)
Admission: RE | Admit: 2021-06-13 | Discharge: 2021-06-13 | Disposition: A | Discharge status: Home / Self Care | Payer: 59 | Source: Ambulatory Visit | Attending: Internal Medicine | Admitting: Internal Medicine

## 2021-06-13 DIAGNOSIS — Z1211 Encounter for screening for malignant neoplasm of colon: Secondary | ICD-10-CM | POA: Diagnosis present

## 2021-06-13 DIAGNOSIS — Z01818 Encounter for other preprocedural examination: Secondary | ICD-10-CM | POA: Diagnosis present

## 2021-06-13 LAB — BASIC METABOLIC PANEL
Anion gap: 9 (ref 5–15)
BUN: 16 mg/dL (ref 6–20)
CO2: 27 mmol/L (ref 22–32)
Calcium: 9.1 mg/dL (ref 8.9–10.3)
Chloride: 105 mmol/L (ref 98–111)
Creatinine, Ser: 0.76 mg/dL (ref 0.44–1.00)
GFR, Estimated: >60 mL/min (ref >60)
Glucose, Bld: 125 mg/dL — ABNORMAL HIGH (ref 70–99)
Potassium: 4.1 mmol/L (ref 3.5–5.1)
Sodium: 141 mmol/L (ref 135–145)

## 2021-06-13 LAB — PREGNANCY, URINE: Preg Test, Ur: NEGATIVE

## 2021-06-16 ENCOUNTER — Encounter (HOSPITAL_COMMUNITY)
Admission: RE | Disposition: A | Discharge status: Home / Self Care | Payer: Self-pay | Source: Home / Self Care | Attending: Internal Medicine

## 2021-06-16 ENCOUNTER — Ambulatory Visit (HOSPITAL_COMMUNITY)
Admission: RE | Admit: 2021-06-16 | Discharge: 2021-06-16 | Disposition: A | Discharge status: Home / Self Care | Payer: 59 | Attending: Internal Medicine | Admitting: Internal Medicine

## 2021-06-16 ENCOUNTER — Encounter (HOSPITAL_COMMUNITY): Payer: Self-pay

## 2021-06-16 ENCOUNTER — Ambulatory Visit (HOSPITAL_BASED_OUTPATIENT_CLINIC_OR_DEPARTMENT_OTHER): Payer: 59 | Admitting: Anesthesiology

## 2021-06-16 ENCOUNTER — Ambulatory Visit (HOSPITAL_COMMUNITY): Payer: 59 | Admitting: Anesthesiology

## 2021-06-16 DIAGNOSIS — K648 Other hemorrhoids: Secondary | ICD-10-CM

## 2021-06-16 DIAGNOSIS — E10319 Type 1 diabetes mellitus with unspecified diabetic retinopathy without macular edema: Secondary | ICD-10-CM | POA: Diagnosis not present

## 2021-06-16 DIAGNOSIS — Z1211 Encounter for screening for malignant neoplasm of colon: Secondary | ICD-10-CM | POA: Diagnosis present

## 2021-06-16 DIAGNOSIS — Z794 Long term (current) use of insulin: Secondary | ICD-10-CM | POA: Insufficient documentation

## 2021-06-16 DIAGNOSIS — J45909 Unspecified asthma, uncomplicated: Secondary | ICD-10-CM | POA: Insufficient documentation

## 2021-06-16 DIAGNOSIS — I10 Essential (primary) hypertension: Secondary | ICD-10-CM | POA: Insufficient documentation

## 2021-06-16 DIAGNOSIS — K621 Rectal polyp: Secondary | ICD-10-CM

## 2021-06-16 DIAGNOSIS — K635 Polyp of colon: Secondary | ICD-10-CM | POA: Diagnosis not present

## 2021-06-16 DIAGNOSIS — Z79899 Other long term (current) drug therapy: Secondary | ICD-10-CM | POA: Insufficient documentation

## 2021-06-16 DIAGNOSIS — D123 Benign neoplasm of transverse colon: Secondary | ICD-10-CM | POA: Insufficient documentation

## 2021-06-16 DIAGNOSIS — F17210 Nicotine dependence, cigarettes, uncomplicated: Secondary | ICD-10-CM | POA: Insufficient documentation

## 2021-06-16 DIAGNOSIS — Z01818 Encounter for other preprocedural examination: Secondary | ICD-10-CM

## 2021-06-16 DIAGNOSIS — D128 Benign neoplasm of rectum: Secondary | ICD-10-CM | POA: Diagnosis not present

## 2021-06-16 DIAGNOSIS — E1040 Type 1 diabetes mellitus with diabetic neuropathy, unspecified: Secondary | ICD-10-CM | POA: Diagnosis not present

## 2021-06-16 HISTORY — PX: COLONOSCOPY WITH PROPOFOL: SHX5780

## 2021-06-16 HISTORY — PX: POLYPECTOMY: SHX5525

## 2021-06-16 LAB — GLUCOSE, CAPILLARY: Glucose-Capillary: 113 mg/dL — ABNORMAL HIGH (ref 70–99)

## 2021-06-16 SURGERY — COLONOSCOPY WITH PROPOFOL
Anesthesia: General

## 2021-06-16 MED ORDER — PROPOFOL 10 MG/ML IV BOLUS
INTRAVENOUS | Status: DC | PRN
  Administered 2021-06-16: 100 mg via INTRAVENOUS

## 2021-06-16 MED ORDER — PROPOFOL 500 MG/50ML IV EMUL
INTRAVENOUS | Status: DC | PRN
Start: 2021-06-16 — End: 2021-06-16
  Administered 2021-06-16: 150 ug/kg/min via INTRAVENOUS

## 2021-06-16 MED ORDER — LACTATED RINGERS IV SOLN
INTRAVENOUS | Status: DC
  Administered 2021-06-16: 1000 mL via INTRAVENOUS

## 2021-06-16 MED ORDER — LIDOCAINE HCL (CARDIAC) PF 100 MG/5ML IV SOSY
PREFILLED_SYRINGE | INTRAVENOUS | Status: DC | PRN
  Administered 2021-06-16: 50 mg via INTRAVENOUS

## 2021-06-16 NOTE — Anesthesia Preprocedure Evaluation (Signed)
Anesthesia Evaluation  ?Patient identified by MRN, date of birth, ID band ?Patient awake ? ? ? ?Reviewed: ?Allergy & Precautions, NPO status , Patient's Chart, lab work & pertinent test results, reviewed documented beta blocker date and time  ? ?History of Anesthesia Complications ?(+) history of anesthetic complications (Complication of anesthesia 07/2014 Mouth was swollen on inside, Front tooth was chipped ) ? ?Airway ?Mallampati: II ? ?TM Distance: >3 FB ?Neck ROM: Full ? ? ? Dental ? ?(+) Dental Advisory Given, Chipped ?  ?Pulmonary ?asthma , pneumonia, neg COPD,  COPD inhaler, Current Smoker and Patient abstained from smoking.,  ?  ?Pulmonary exam normal ?breath sounds clear to auscultation ? ? ? ? ? ? Cardiovascular ?Exercise Tolerance: Good ?hypertension, Pt. on medications and Pt. on home beta blockers ?+ angina Normal cardiovascular exam ?Rhythm:Regular Rate:Normal ? ? ?  ?Neuro/Psych ? Headaches,  Neuromuscular disease negative psych ROS  ? GI/Hepatic ?Neg liver ROS, GERD  Controlled,  ?Endo/Other  ?diabetes, Well Controlled, Type 1, Insulin Dependent ? Renal/GU ?negative Renal ROS  ?negative genitourinary ?  ?Musculoskeletal ?negative musculoskeletal ROS ?(+)  ? Abdominal ?  ?Peds ?negative pediatric ROS ?(+)  Hematology ?negative hematology ROS ?(+)   ?Anesthesia Other Findings ? ? Reproductive/Obstetrics ?negative OB ROS ? ?  ? ? ? ? ? ? ? ? ? ? ? ? ? ?  ?  ? ? ? ? ? ? ? ? ?Anesthesia Physical ?Anesthesia Plan ? ?ASA: 3 ? ?Anesthesia Plan: General  ? ?Post-op Pain Management: Minimal or no pain anticipated  ? ?Induction: Intravenous ? ?PONV Risk Score and Plan: TIVA ? ?Airway Management Planned: Nasal Cannula and Natural Airway ? ?Additional Equipment:  ? ?Intra-op Plan:  ? ?Post-operative Plan:  ? ?Informed Consent: I have reviewed the patients History and Physical, chart, labs and discussed the procedure including the risks, benefits and alternatives for the proposed  anesthesia with the patient or authorized representative who has indicated his/her understanding and acceptance.  ? ? ? ? ? ?Plan Discussed with: Surgeon ? ?Anesthesia Plan Comments:   ? ? ? ? ? ? ?Anesthesia Quick Evaluation ? ?

## 2021-06-16 NOTE — Op Note (Signed)
[facility] ?Patient Name: [name] ?Procedure Date: 06/16/2021 12:10 PM ?MRN: [id number] ?Date of Birth: 1975/09/22 ?Attending MD: Elon Alas. Abbey Chatters , DO ?CSN: [id number] ?Age: 46 ?Admit Type: Outpatient ?Procedure:                Colonoscopy ?Indications:              Screening for colorectal malignant neoplasm ?Providers:                Elon Alas. Abbey Chatters, DO, Janeece Riggers, RN, Crisann  ?                          Wynonia Lawman, Technician ?Referring MD:              ?Medicines:                See the Anesthesia note for documentation of the  ?                          administered medications ?Complications:            No immediate complications. ?Estimated Blood Loss:     Estimated blood loss was minimal. ?Procedure:                Pre-Anesthesia Assessment: ?                          - The anesthesia plan was to use monitored  ?                          anesthesia care (MAC). ?                          After obtaining informed consent, the colonoscope  ?                          was passed under direct vision. Throughout the  ?                          procedure, the patient's blood pressure, pulse, and  ?                          oxygen saturations were monitored continuously. The  ?                          PCF-HQ190L ([id number]) scope was introduced through  ?                          the anus and advanced to the the cecum, identified  ?                          by appendiceal orifice and ileocecal valve. The  ?                          colonoscopy was performed without difficulty. The  ?                          patient tolerated the procedure well. The  quality  ?                          of the bowel preparation was evaluated using the  ?                          BBPS [facility] Bowel Preparation Scale) with scores  ?                          of: Right Colon = 3, Transverse Colon = 3 and Left  ?                          Colon = 3 (entire mucosa seen well with no residual  ?                          staining,  small fragments of stool or opaque  ?                          liquid). The total BBPS score equals 9. ?Scope In: 12:27:11 PM ?Scope Out: 19:41:74 PM ?Scope Withdrawal Time: 0 hours 7 minutes 5 seconds  ?Total Procedure Duration: 0 hours 9 minutes 48 seconds  ?Findings: ?     The perianal and digital rectal examinations were normal. ?     Non-bleeding internal hemorrhoids were found during endoscopy. ?     A 6 mm polyp was found in the transverse colon. The polyp was sessile.  ?     The polyp was removed with a cold snare. Resection and retrieval were  ?     complete. ?     A 3 mm polyp was found in the rectum. The polyp was sessile. The polyp  ?     was removed with a cold snare. Resection and retrieval were complete. ?     The exam was otherwise without abnormality. ?Impression:               - Non-bleeding internal hemorrhoids. ?                          - One 6 mm polyp in the transverse colon, removed  ?                          with a cold snare. Resected and retrieved. ?                          - One 3 mm polyp in the rectum, removed with a cold  ?                          snare. Resected and retrieved. ?                          - The examination was otherwise normal. ?Moderate Sedation: ?     Per Anesthesia Care ?Recommendation:           - Patient has a contact number available for  ?  emergencies. The signs and symptoms of potential  ?                          delayed complications were discussed with the  ?                          patient. Return to normal activities tomorrow.  ?                          Written discharge instructions were provided to the  ?                          patient. ?                          - Resume previous diet. ?                          - Continue present medications. ?                          - Await pathology results. ?                          - Repeat colonoscopy in 5 years for surveillance. ?                          - Return to GI clinic  PRN. ?Procedure Code(s):        --- Professional --- ?                          [phone], Colonoscopy, flexible; with removal of  ?                          tumor(s), polyp(s), or other lesion(s) by snare  ?                          technique ?Diagnosis Code(s):        --- Professional --- ?                          Z12.11, Encounter for screening for malignant  ?                          neoplasm of colon ?                          K63.5, Polyp of colon ?                          K62.1, Rectal polyp ?                          K64.8, Other hemorrhoids ?CPT copyright 2019 American Medical Association. All rights reserved. ?The codes documented in this report are preliminary and upon coder review may  ?be revised to meet current compliance requirements. ?Elon Alas. Abbey Chatters, DO ?Elon Alas. Ribera, DO ?06/16/2021 12:39:26  PM ?This report has been signed electronically. ?Number of Addenda: 0 ?

## 2021-06-16 NOTE — Discharge Instructions (Addendum)
?  Colonoscopy Discharge Instructions  Read the instructions outlined below and refer to this sheet in the next few weeks. These discharge instructions provide you with general information on caring for yourself after you leave the hospital. Your doctor may also give you specific instructions. While your treatment has been planned according to the most current medical practices available, unavoidable complications occasionally occur.   ACTIVITY You may resume your regular activity, but move at a slower pace for the next 24 hours.  Take frequent rest periods for the next 24 hours.  Walking will help get rid of the air and reduce the bloated feeling in your belly (abdomen).  No driving for 24 hours (because of the medicine (anesthesia) used during the test).   Do not sign any important legal documents or operate any machinery for 24 hours (because of the anesthesia used during the test).  NUTRITION Drink plenty of fluids.  You may resume your normal diet as instructed by your doctor.  Begin with a light meal and progress to your normal diet. Heavy or fried foods are harder to digest and may make you feel sick to your stomach (nauseated).  Avoid alcoholic beverages for 24 hours or as instructed.  MEDICATIONS You may resume your normal medications unless your doctor tells you otherwise.  WHAT YOU CAN EXPECT TODAY Some feelings of bloating in the abdomen.  Passage of more gas than usual.  Spotting of blood in your stool or on the toilet paper.  IF YOU HAD POLYPS REMOVED DURING THE COLONOSCOPY: No aspirin products for 7 days or as instructed.  No alcohol for 7 days or as instructed.  Eat a soft diet for the next 24 hours.  FINDING OUT THE RESULTS OF YOUR TEST Not all test results are available during your visit. If your test results are not back during the visit, make an appointment with your caregiver to find out the results. Do not assume everything is normal if you have not heard from your  caregiver or the medical facility. It is important for you to follow up on all of your test results.  SEEK IMMEDIATE MEDICAL ATTENTION IF: You have more than a spotting of blood in your stool.  Your belly is swollen (abdominal distention).  You are nauseated or vomiting.  You have a temperature over 101.  You have abdominal pain or discomfort that is severe or gets worse throughout the day.   Your colonoscopy revealed 2 polyp(s) which I removed successfully. Await pathology results, my office will contact you. I recommend repeating colonoscopy in 5 years for surveillance purposes. Otherwise follow up with GI as needed.    I hope you have a great rest of your week!  Charles K. Carver, D.O. Gastroenterology and Hepatology [facility]  

## 2021-06-16 NOTE — Anesthesia Postprocedure Evaluation (Signed)
Anesthesia Post Note ? ?Patient: [name] ? ?Procedure(s) Performed: COLONOSCOPY WITH PROPOFOL ?POLYPECTOMY ? ?Patient location during evaluation: Phase II ?Anesthesia Type: General ?Level of consciousness: awake and alert and oriented ?Pain management: pain level controlled ?Vital Signs Assessment: post-procedure vital signs reviewed and stable ?Respiratory status: spontaneous breathing, nonlabored ventilation and respiratory function stable ?Cardiovascular status: blood pressure returned to baseline and stable ?Postop Assessment: no apparent nausea or vomiting ?Anesthetic complications: no ? ? ?No notable events documented. ? ? ?Last Vitals:  ?Vitals:  ? 06/16/21 1117 06/16/21 1242  ?BP: (!) 142/73 111/69  ?Pulse:  99  ?Resp: (!) 22 (!) 22  ?Temp: 37.1 ?C   ?SpO2: 100% 98%  ?  ?Last Pain:  ?Vitals:  ? 06/16/21 1242  ?TempSrc:   ?PainSc: 0-No pain  ? ? ?  ?  ?  ?  ?  ?  ? ?Jailin Manocchio C Jonan Seufert ? ? ? ? ?

## 2021-06-16 NOTE — Transfer of Care (Signed)
Immediate Anesthesia Transfer of Care Note ? ?Patient: [name] ? ?Procedure(s) Performed: COLONOSCOPY WITH PROPOFOL ?POLYPECTOMY ? ?Patient Location: Short Stay ? ?Anesthesia Type:General ? ?Level of Consciousness: awake, alert  and oriented ? ?Airway & Oxygen Therapy: Patient Spontanous Breathing ? ?Post-op Assessment: Report given to RN and Post -op Vital signs reviewed and stable ? ?Post vital signs: Reviewed and stable ? ?Last Vitals:  ?Vitals Value Taken Time  ?BP 111/69 06/16/21 1242  ?Temp    ?Pulse 99 06/16/21 1242  ?Resp 22 06/16/21 1242  ?SpO2 98 % 06/16/21 1242  ? ? ?Last Pain:  ?Vitals:  ? 06/16/21 1242  ?TempSrc:   ?PainSc: 0-No pain  ?   ? ?Patients Stated Pain Goal: 8 (06/16/21 1117) ? ?Complications: No notable events documented. ?

## 2021-06-16 NOTE — H&P (Signed)
Primary Care Physician:  Susy Frizzle, MD ?Primary Gastroenterologist:  Dr. Abbey Chatters ? ?Pre-Procedure History & Physical: ?HPI:  [name] is a 46 y.o. female is here for first ever colonoscopy for colon cancer screening purposes.  Patient denies any family history of colorectal cancer.  No melena or hematochezia.  No abdominal pain or unintentional weight loss.  No change in bowel habits.  Overall feels well from a GI standpoint. ? ?Past Medical History:  ?Diagnosis Date  ? Asthma   ? Carpal tunnel syndrome on left   ? Complication of anesthesia 07/2014  ? Mouth was swollen on inside, Front tooth was chipped  ? Diabetes mellitus   ? 19 years ago  ? Diabetic neuropathy ([place])   ? Diabetic retinopathy ([place])   ? Essential hypertension 12/23/2015  ? GERD (gastroesophageal reflux disease)   ? Headache   ? Migraine- a long time ago  ? Hypercholesteremia   ? Hyperlipidemia 12/23/2015  ? Hypertension   ? Muscle spasms of lower extremity   ? legs and weakness  ? Pneumonia   ? Restless leg   ? Type 1 diabetes mellitus ([place])   ? Vitreous hemorrhage ([place]) 08/08/2014  ? Vitreous hemorrhage of right eye ([place]) 08/28/2014  ? Vitreous hemorrhage, left eye ([place])   ? ? ?Past Surgical History:  ?Procedure Laterality Date  ? CARDIAC CATHETERIZATION N/A 12/27/2015  ? Procedure: Left Heart Cath and Coronary Angiography;  Surgeon: Troy Sine, MD;  Location: [facility];  Service: Cardiovascular;  Laterality: N/A;  ? [place], 2002  ? [place] OF UTERUS  1997  ? GAS/FLUID EXCHANGE Right 08/28/2014  ? Procedure: GAS/FLUID EXCHANGE;  Surgeon: Hayden Pedro, MD;  Location: [facility];  Service: Ophthalmology;  Laterality: Right;  ? LASER PHOTO ABLATION    ? left x 2, right x 1  ? LASER PHOTO ABLATION Right 08/28/2014  ? Procedure: LASER PHOTO ABLATION;  Surgeon: Hayden Pedro, MD;  Location: [facility];  Service: Ophthalmology;  Laterality: Right;  ? MEMBRANE PEEL Right 08/28/2014  ? Procedure: MEMBRANE  PEEL;  Surgeon: Hayden Pedro, MD;  Location: [facility];  Service: Ophthalmology;  Laterality: Right;  ? PARS PLANA VITRECTOMY Right 08/28/2014  ? Procedure: PARS PLANA VITRECTOMY WITH 25 GAUGE;  Surgeon: Hayden Pedro, MD;  Location: [facility];  Service: Ophthalmology;  Laterality: Right;  ? PARS PLANA VITRECTOMY Left 12/24/2014  ? Procedure: LEFT PARS PLANA VITRECTOMY WITH 25 GAUGE WITH ENDOLASER ;  Surgeon: Hurman Horn, MD;  Location: [facility];  Service: Ophthalmology;  Laterality: Left;  ? TUBAL LIGATION  2002  ? WISDOM TOOTH EXTRACTION    ? age 46  ? ? ?Prior to Admission medications   ?Medication Sig Start Date End Date Taking? Authorizing Provider  ?albuterol (PROAIR HFA) 108 (90 Base) MCG/ACT inhaler 2 PUFFS EVERY 6 HOURS AS NEEDED for shortness of breath 02/19/16  Yes Susy Frizzle, MD  ?albuterol (PROVENTIL) (2.5 MG/3ML) 0.083% nebulizer solution Take 3 mLs (2.5 mg total) by nebulization every 4 (four) hours as needed for wheezing or shortness of breath. 11/11/15  Yes Susy Frizzle, MD  ?carvedilol (COREG) 25 MG tablet Take 1 tablet (25 mg total) by mouth daily at 6 (six) AM. ?Patient taking differently: Take 25 mg by mouth daily. 05/05/21  Yes Susy Frizzle, MD  ?HUMALOG 100 UNIT/ML injection Titrating as directed ?Patient taking differently: Inject 0-8 Units into the skin 3 (three) times daily  before meals. 04/21/21  Yes Susy Frizzle, MD  ?LANTUS SOLOSTAR 100 UNIT/ML Solostar Pen Inject 35 Units into the skin daily. Increase by 1 Unit each day until FBG is less than 130. Then hold at that dose. ?Patient taking differently: Inject 45 Units into the skin in the morning. 04/24/21  Yes Susy Frizzle, MD  ?losartan (COZAAR) 100 MG tablet Take 1 tablet by mouth once daily 05/05/21  Yes Susy Frizzle, MD  ?OVER THE COUNTER MEDICATION Take 2 tablets by mouth daily as needed (restless legs). Restless leg relief   Yes [provider]  ? ? ?Allergies as of 06/03/2021 - Review Complete 05/27/2021   ?Allergen Reaction Noted  ? Fish-derived products Hives and Shortness Of Breath 08/28/2014  ? Demerol Itching and Rash 01/07/2011  ? Morphine and related Itching and Rash 01/07/2011  ? Zocor [simvastatin] Itching and Other (See Comments) 04/22/2011  ? ? ?Family History  ?Problem Relation Age of Onset  ? Diabetes Mother   ? Parkinsonism Mother   ? Diabetes Father   ? Hypertension Father   ? Diabetes Brother   ? Heart failure Maternal Grandmother   ? Diabetes Maternal Grandmother   ? ? ?Social History  ? ?Socioeconomic History  ? Marital status: Married  ?  Spouse name: Not on file  ? Number of children: Not on file  ? Years of education: Not on file  ? Highest education level: Not on file  ?Occupational History  ? Not on file  ?Tobacco Use  ? Smoking status: Every Day  ?  Packs/day: 2.00  ?  Years: 9.00  ?  Pack years: 18.00  ?  Types: Cigarettes  ? Smokeless tobacco: Never  ?Substance and Sexual Activity  ? Alcohol use: No  ? Drug use: No  ? Sexual activity: Yes  ?  Partners: Male  ?  Birth control/protection: None, Surgical  ?  Comment: tubal   ?Other Topics Concern  ? Not on file  ?Social History Narrative  ? Married, lives with spouse and 2 kids  ? Occupation: Chemical engineer  ? ?Social Determinants of Health  ? ?Financial Resource Strain: Not on file  ?Food Insecurity: Not on file  ?Transportation Needs: Not on file  ?Physical Activity: Not on file  ?Stress: Not on file  ?Social Connections: Not on file  ?Intimate Partner Violence: Not on file  ? ? ?Review of Systems: ?See HPI, otherwise negative ROS ? ?Physical Exam: ?Vital signs in last 24 hours: ?Temp:  [98.7 ?F (37.1 ?C)] 98.7 ?F (37.1 ?C) (03/20 1117) ?Resp:  [22] 22 (03/20 1117) ?BP: (142)/(73) 142/73 (03/20 1117) ?SpO2:  [100 %] 100 % (03/20 1117) ?Weight:  [77.1 kg] 77.1 kg (03/20 1117) ?  ?General:   Alert,  Well-developed, well-nourished, pleasant and cooperative in NAD ?Head:  Normocephalic and atraumatic. ?Eyes:  Sclera clear, no icterus.    Conjunctiva pink. ?Ears:  Normal auditory acuity. ?Nose:  No deformity, discharge,  or lesions. ?Mouth:  No deformity or lesions, dentition normal. ?Neck:  Supple; no masses or thyromegaly. ?Lungs:  Clear throughout to auscultation.   No wheezes, crackles, or rhonchi. No acute distress. ?Heart:  Regular rate and rhythm; no murmurs, clicks, rubs,  or gallops. ?Abdomen:  Soft, nontender and nondistended. No masses, hepatosplenomegaly or hernias noted. Normal bowel sounds, without guarding, and without rebound.   ?Msk:  Symmetrical without gross deformities. Normal posture. ?Extremities:  Without clubbing or edema. ?Neurologic:  Alert and  oriented x4;  grossly normal  neurologically. ?Skin:  Intact without significant lesions or rashes. ?Cervical Nodes:  No significant cervical adenopathy. ?Psych:  Alert and cooperative. Normal mood and affect. ? ?Impression/Plan: ?[name] is here for a colonoscopy to be performed for colon cancer screening purposes. ? ?The risks of the procedure including infection, bleed, or perforation as well as benefits, limitations, alternatives and imponderables have been reviewed with the patient. Questions have been answered. All parties agreeable. ? ? ?

## 2021-06-17 LAB — GLUCOSE, CAPILLARY: Glucose-Capillary: 104 mg/dL — ABNORMAL HIGH (ref 70–99)

## 2021-06-18 LAB — SURGICAL PATHOLOGY

## 2021-06-19 ENCOUNTER — Encounter (HOSPITAL_COMMUNITY): Payer: Self-pay | Admitting: Internal Medicine

## 2021-06-20 ENCOUNTER — Other Ambulatory Visit: Payer: Self-pay | Admitting: Family Medicine

## 2021-06-23 ENCOUNTER — Telehealth: Payer: Self-pay | Admitting: Family Medicine

## 2021-06-23 NOTE — Telephone Encounter (Signed)
Received eFax from pharmacy to request refill of HUMALOG 100 UNIT/ML injection ? ? ?Pharmacy eFax received from: ? ?[facility], [place], [place] [address]  ?Phone:  [phone]  Fax:  [phone]  ? ?Please advise pharmacist.  ?

## 2021-06-24 NOTE — Telephone Encounter (Signed)
VM box is full, unable to leave msg ?

## 2021-07-09 NOTE — Telephone Encounter (Signed)
Rx been sent to pharmacy 06/23/21 ?

## 2021-10-09 ENCOUNTER — Other Ambulatory Visit: Payer: Self-pay | Admitting: Family Medicine

## 2021-10-09 NOTE — Telephone Encounter (Signed)
Requested medication (s) are due for refill today:   Requested medication (s) are on the active medication list: no  Last refill:  06/01/20  Future visit scheduled: yes  Notes to clinic:  rx was dc'd on 05/27/21. Please advise for refill     Requested Prescriptions  Pending Prescriptions Disp Refills   ONETOUCH VERIO test strip [Pharmacy Med Name: OneTouch Verio In Vitro Strip] 100 each 0    Sig: USE AS DIRECTED     Endocrinology: Diabetes - Testing Supplies Passed - 10/09/2021  4:25 PM      Passed - Valid encounter within last 12 months    Recent Outpatient Visits           6 months ago Proliferative diabetic retinopathy associated with type 1 diabetes mellitus, unspecified laterality, unspecified proliferative retinopathy type ([place])   [facility] [facility] Susy Frizzle, MD   1 year ago Diabetes mellitus type 1, controlled, insulin dependent ([place])   [place] Susy Frizzle, MD   2 years ago Diabetes mellitus type 1, controlled, insulin dependent ([place])   [place] Susy Frizzle, MD   3 years ago Diabetes mellitus type 1, controlled, insulin dependent ([place])   [place] Pickard, Cammie Mcgee, MD   4 years ago Controlled diabetes mellitus type 1 with complications ([place])   [place] Pickard, Cammie Mcgee, MD       Future Appointments             In 4 weeks Pickard, Cammie Mcgee, MD [place], [facility]

## 2021-10-16 ENCOUNTER — Other Ambulatory Visit: Payer: Self-pay | Admitting: Family Medicine

## 2021-10-16 ENCOUNTER — Encounter (INDEPENDENT_AMBULATORY_CARE_PROVIDER_SITE_OTHER): Payer: Self-pay | Admitting: Ophthalmology

## 2021-10-16 ENCOUNTER — Encounter (INDEPENDENT_AMBULATORY_CARE_PROVIDER_SITE_OTHER): Payer: 59 | Admitting: Ophthalmology

## 2021-10-16 ENCOUNTER — Ambulatory Visit (INDEPENDENT_AMBULATORY_CARE_PROVIDER_SITE_OTHER): Payer: 59 | Admitting: Ophthalmology

## 2021-10-16 ENCOUNTER — Telehealth: Payer: Self-pay

## 2021-10-16 DIAGNOSIS — E103552 Type 1 diabetes mellitus with stable proliferative diabetic retinopathy, left eye: Secondary | ICD-10-CM | POA: Diagnosis not present

## 2021-10-16 DIAGNOSIS — E103551 Type 1 diabetes mellitus with stable proliferative diabetic retinopathy, right eye: Secondary | ICD-10-CM | POA: Diagnosis not present

## 2021-10-16 MED ORDER — LANTUS SOLOSTAR 100 UNIT/ML ~~LOC~~ SOPN: 35.0000 [IU] | PEN_INJECTOR | Freq: Every day | SUBCUTANEOUS | 3 refills | Status: DC

## 2021-10-16 NOTE — Assessment & Plan Note (Signed)
Quiescent PDR OS, with no interval change, no active maculopathy post cataract surgery

## 2021-10-16 NOTE — Assessment & Plan Note (Signed)
Diffuse macular atrophy unchanged OD.  No worsening of maculopathy looks great post cataract surgery

## 2021-10-16 NOTE — Telephone Encounter (Signed)
Pt's spouse called in to inquire about pt's refill of LANTUS SOLOSTAR 100 UNIT/ML Solostar Pen [[id number]] and [facility] VERIO test strip [facility] Med Name: Roma Schanz In Vitro Strip] [[id number]]   CB#: [phone]   LOV: 04/04/21 UPCOMING APPT:11/06/21

## 2021-10-16 NOTE — Progress Notes (Signed)
10/16/2021     CHIEF COMPLAINT Patient presents for  Chief Complaint  Patient presents with   Eye Problem      HISTORY OF PRESENT ILLNESS: [name] is a 46 y.o. female who presents to the clinic today for:   HPI   WIP- Pt had cataract sx Pt stated she had cataract sx in both eyes performed by Dr. Wyatt Portela. "When I first had the surgery done, I could see distance it was hazy but it would take a few days,everyday I could see a little better up close. For two weeks and then I had the second eye done, it was like everything in the left eye, it was like everything went hazy. I had told Dr. Katy Fitch about my concerns but he was pretty rude about it and wont even check my lens. That's where he told me that it was due to my diabetes. Its hard for me to continue on with everyday life activities but I cant even do that because of my vision."   Last edited by Silvestre Moment on 10/16/2021  3:15 PM.      Referring physician: Susy Frizzle, MD [address] [place],   [address]  HISTORICAL INFORMATION:   Selected notes from the MEDICAL RECORD NUMBER    Lab Results  Component Value Date   HGBA1C 8.6 (H) 04/04/2021     CURRENT MEDICATIONS: No current outpatient medications on file. (Ophthalmic Drugs)   No current facility-administered medications for this visit. (Ophthalmic Drugs)   Current Outpatient Medications (Other)  Medication Sig   albuterol (PROAIR HFA) 108 (90 Base) MCG/ACT inhaler 2 PUFFS EVERY 6 HOURS AS NEEDED for shortness of breath   albuterol (PROVENTIL) (2.5 MG/3ML) 0.083% nebulizer solution Take 3 mLs (2.5 mg total) by nebulization every 4 (four) hours as needed for wheezing or shortness of breath.   carvedilol (COREG) 25 MG tablet Take 1 tablet (25 mg total) by mouth daily at 6 (six) AM. (Patient taking differently: Take 25 mg by mouth daily.)   HUMALOG 100 UNIT/ML injection INJECT 3 TO 8 UNITS SUBCUTANEOUSLY THREE TIMES DAILY WITH MEALS   LANTUS  SOLOSTAR 100 UNIT/ML Solostar Pen Inject 35 Units into the skin daily. Increase by 1 Unit each day until FBG is less than 130. Then hold at that dose.   losartan (COZAAR) 100 MG tablet Take 1 tablet by mouth once daily   OVER THE COUNTER MEDICATION Take 2 tablets by mouth daily as needed (restless legs). Restless leg relief   No current facility-administered medications for this visit. (Other)      REVIEW OF SYSTEMS: ROS   Negative for: Constitutional, Gastrointestinal, Neurological, Skin, Genitourinary, Musculoskeletal, HENT, Endocrine, Cardiovascular, Eyes, Respiratory, Psychiatric, Allergic/Imm, Heme/Lymph Last edited by Silvestre Moment on 10/16/2021  3:15 PM.       ALLERGIES Allergies  Allergen Reactions   Fish-Derived Products Hives and Shortness Of Breath   Demerol Itching and Rash   Morphine And Related Itching and Rash        Zocor [Simvastatin] Itching and Other (See Comments)    Leg pain    PAST MEDICAL HISTORY Past Medical History:  Diagnosis Date   Asthma    Carpal tunnel syndrome on left    Complication of anesthesia 07/2014   Mouth was swollen on inside, Front tooth was chipped   Diabetes mellitus    19 years ago   Diabetic neuropathy ([place])    Diabetic retinopathy ([place])    Essential  hypertension 12/23/2015   GERD (gastroesophageal reflux disease)    Headache    Migraine- a long time ago   Hypercholesteremia    Hyperlipidemia 12/23/2015   Hypertension    Muscle spasms of lower extremity    legs and weakness   Pneumonia    Restless leg    Type 1 diabetes mellitus ([place])    Vitreous hemorrhage ([place]) 08/08/2014   Vitreous hemorrhage of right eye ([place]) 08/28/2014   Vitreous hemorrhage, left eye ([place])    Past Surgical History:  Procedure Laterality Date   CARDIAC CATHETERIZATION N/A 12/27/2015   Procedure: Left Heart Cath and Coronary Angiography;  Surgeon: Troy Sine, MD;  Location: [facility];  Service: Cardiovascular;  Laterality: N/A;   [place], 2002   COLONOSCOPY WITH PROPOFOL N/A 06/16/2021   Procedure: COLONOSCOPY WITH PROPOFOL;  Surgeon: Eloise Harman, DO;  [facility];  Service: Endoscopy;  Laterality: N/A;  12:45 / ASA 2   DILATION AND CURETTAGE OF UTERUS  1997   GAS/FLUID EXCHANGE Right 08/28/2014   Procedure: GAS/FLUID EXCHANGE;  Surgeon: Hayden Pedro, MD;  Location: [facility];  Service: Ophthalmology;  Laterality: Right;   LASER PHOTO ABLATION     left x 2, right x 1   LASER PHOTO ABLATION Right 08/28/2014   Procedure: LASER PHOTO ABLATION;  Surgeon: Hayden Pedro, MD;  Location: [facility];  Service: Ophthalmology;  Laterality: Right;   MEMBRANE PEEL Right 08/28/2014   Procedure: MEMBRANE PEEL;  Surgeon: Hayden Pedro, MD;  Location: [facility];  Service: Ophthalmology;  Laterality: Right;   PARS PLANA VITRECTOMY Right 08/28/2014   Procedure: PARS PLANA VITRECTOMY WITH 25 GAUGE;  Surgeon: Hayden Pedro, MD;  Location: [facility];  Service: Ophthalmology;  Laterality: Right;   PARS PLANA VITRECTOMY Left 12/24/2014   Procedure: LEFT PARS PLANA VITRECTOMY WITH 25 GAUGE WITH ENDOLASER ;  Surgeon: Hurman Horn, MD;  Location: [facility];  Service: Ophthalmology;  Laterality: Left;   POLYPECTOMY  06/16/2021   Procedure: POLYPECTOMY;  Surgeon: Eloise Harman, DO;  [facility];  Service: Endoscopy;;   TUBAL LIGATION  2002   WISDOM TOOTH EXTRACTION     age 58    FAMILY HISTORY Family History  Problem Relation Age of Onset   Diabetes Mother    Parkinsonism Mother    Diabetes Father    Hypertension Father    Diabetes Brother    Heart failure Maternal Grandmother    Diabetes Maternal Grandmother     SOCIAL HISTORY Social History   Tobacco Use   Smoking status: Every Day    Packs/day: 2.00    Years: 9.00    Total pack years: 18.00    Types: Cigarettes   Smokeless tobacco: Never  Substance Use Topics   Alcohol use: No   Drug use: No         OPHTHALMIC EXAM:  Base Eye Exam      Visual Acuity (ETDRS)       Right Left   Dist [place] CF at 3' 20/40   Dist ph [place]  20/25 -1         Tonometry (Tonopen, 3:21 PM)       Right Left   Pressure 17 23         Pupils       Pupils APD   Right PERRL None   Left PERRL None  Visual Fields       Left Right    Full    Restrictions  Partial outer superior temporal deficiency         Extraocular Movement       Right Left    Full Full         Neuro/Psych     Oriented x3: Yes   Mood/Affect: Normal         Dilation     Both eyes: 1.0% Mydriacyl, 2.5% Phenylephrine @ 3:20 PM           Slit Lamp and Fundus Exam     External Exam       Right Left   External Normal Normal         Slit Lamp Exam       Right Left   Lids/Lashes Normal Normal   Conjunctiva/Sclera White and quiet White and quiet   Cornea Clear Clear   Anterior Chamber Deep and quiet Deep and quiet   Iris Round and reactive Round and reactive   Lens 2+ Nuclear sclerosis, central oil droplets change 2+ Nuclear sclerosis, central oral droplet change   Anterior Vitreous Normal Normal         Fundus Exam       Right Left   Posterior Vitreous Clear vitrectomized Clear vitrectomized   Disc Slight temporal pallor, superiorly Normal   C/D Ratio 0.5 0.5   Macula No obvious macular edema no exudate No obvious macular edema no exudate   Vessels PDR-quiet PDR-quiet   Periphery Good PRP 360 tight to the posterior pole Good PRP 360 tight to the posterior pole            IMAGING AND PROCEDURES  Imaging and Procedures for 10/16/21  OCT, Retina - OU - Both Eyes       Right Eye Quality was good. Scan locations included subfoveal. Central Foveal Thickness: 246. Progression has been stable. Findings include abnormal foveal contour.   Left Eye Quality was good. Scan locations included subfoveal. Central Foveal Thickness: 286. Progression has been stable. Findings include abnormal foveal contour.   Notes OD with  atrophy superior portion of the macula, antral macular anatomy preserved, yet optic atrophy may still limit acuity OD  OS similarly inferior macula is undergone atrophy but overall stable compared to 2019 last visit.  No active maculopathy no real interval change over the last 7 years in either eye.      Color Fundus Photography Optos - OU - Both Eyes       Right Eye Progression has been stable. Disc findings include increased cup to disc ratio, pallor, thinning of rim. Macula : microaneurysms.   Left Eye Progression has been stable. Disc findings include increased cup to disc ratio, pallor, thinning of rim. Macula : microaneurysms.   Notes Bilateral PDR, quiescent with good PRP, resolution of small hemorrhage inferiorly as compared to 2016              ASSESSMENT/PLAN:  Stable treated proliferative diabetic retinopathy of left eye without macular edema determined by examination associated with type 1 diabetes mellitus ([place]) Quiescent PDR OS, with no interval change, no active maculopathy post cataract surgery  Stable treated proliferative diabetic retinopathy of right eye determined by examination associated with type 1 diabetes mellitus ([place]) Diffuse macular atrophy unchanged OD.  No worsening of maculopathy looks great post cataract surgery     ICD-10-CM   1. Stable treated proliferative diabetic retinopathy of left eye without  macular edema determined by examination associated with type 1 diabetes mellitus (HCC)  E10.3552 OCT, Retina - OU - Both Eyes    Color Fundus Photography Optos - OU - Both Eyes    2. Stable treated proliferative diabetic retinopathy of right eye determined by examination associated with type 1 diabetes mellitus ([place])  E10.3551       1.  Patient has no change in corrected vision in the right eye post cataract surgery no surprise because of the macular nonperfusion explained to the patient with examples on OCT with her and also comparative to  normals.  OS has split macular function but nonetheless fovea is intact anatomically and also by OCT.  Thus no worsening of the maculopathy occurred with routine cataract surgery.  Visual acuity of 20/40 uncorrected pinhole to 20/25.  I spent some time explained to the patient and husband that this suggest that there may be slight uncorrected refractive error potentially even astigmatism that cannot be corrected with routine cataract surgery and routine nonhigh performance, nonpremium lenses.  Thus she may have a little residual astigmatism that can be corrected with competent refraction and potentially distance and/or near and/or bifocal lenses in the future.  I explained I do not do this type of work.  That patient certainly has near vision compromise at this point.  But also may have some residual distance vision.  Certainly she needs careful refraction and review of these findings to maximize her visual potential.  2.  3.  Ophthalmic Meds Ordered this visit:  No orders of the defined types were placed in this encounter.      Return in about 9 months (around 07/18/2022) for DILATE OU, COLOR FP, OCT.  There are no Patient Instructions on file for this visit.   Explained the diagnoses, plan, and follow up with the patient and they expressed understanding.  Patient expressed understanding of the importance of proper follow up care.   Clent Demark Erhardt Dada M.D. Diseases & Surgery of the Retina and Vitreous [facility]ina [facility] Diabetic [place] 10/16/21     Abbreviations: M myopia (nearsighted); A astigmatism; H hyperopia (farsighted); P presbyopia; Mrx spectacle prescription;  CTL contact lenses; OD right eye; OS left eye; OU both eyes  XT exotropia; ET esotropia; PEK punctate epithelial keratitis; PEE punctate epithelial erosions; DES dry eye syndrome; MGD meibomian gland dysfunction; ATs artificial tears; PFAT's preservative free artificial tears; [place] nuclear sclerotic cataract; PSC posterior  subcapsular cataract; ERM epi-retinal membrane; PVD posterior vitreous detachment; RD retinal detachment; DM diabetes mellitus; DR diabetic retinopathy; NPDR non-proliferative diabetic retinopathy; PDR proliferative diabetic retinopathy; CSME clinically significant macular edema; DME diabetic macular edema; dbh dot blot hemorrhages; CWS cotton wool spot; POAG primary open angle glaucoma; C/D cup-to-disc ratio; HVF humphrey visual field; GVF goldmann visual field; OCT optical coherence tomography; IOP intraocular pressure; BRVO Branch retinal vein occlusion; CRVO central retinal vein occlusion; CRAO central retinal artery occlusion; BRAO branch retinal artery occlusion; RT retinal tear; SB scleral buckle; PPV pars plana vitrectomy; VH Vitreous hemorrhage; PRP panretinal laser photocoagulation; IVK intravitreal kenalog; VMT vitreomacular traction; MH Macular hole;  NVD neovascularization of the disc; NVE neovascularization elsewhere; AREDS age related eye disease study; ARMD age related macular degeneration; POAG primary open angle glaucoma; EBMD epithelial/anterior basement membrane dystrophy; ACIOL anterior chamber intraocular lens; IOL intraocular lens; PCIOL posterior chamber intraocular lens; Phaco/IOL phacoemulsification with intraocular lens placement; [place] photorefractive keratectomy; LASIK laser assisted in situ keratomileusis; HTN hypertension; DM diabetes mellitus; COPD chronic obstructive pulmonary disease

## 2021-10-17 ENCOUNTER — Other Ambulatory Visit: Payer: Self-pay | Admitting: Family Medicine

## 2021-10-17 MED ORDER — INSULIN GLARGINE-YFGN 100 UNIT/ML ~~LOC~~ SOLN
35.0000 [IU] | Freq: Every day | SUBCUTANEOUS | 3 refills | Status: DC
Start: 2021-10-17 — End: 2021-11-06

## 2021-10-19 ENCOUNTER — Other Ambulatory Visit: Payer: Self-pay | Admitting: Family Medicine

## 2021-10-26 ENCOUNTER — Other Ambulatory Visit: Payer: Self-pay | Admitting: Family Medicine

## 2021-10-28 NOTE — Telephone Encounter (Signed)
Requested medication (s) are due for refill today: no  Requested medication (s) are on the active medication list: no  Last refill:  03/06/20  Future visit scheduled:yes  Notes to clinic:  Unable to refill per protocol, Rx expired.Medication was discontinued 05/27/21. Possible duplicate request.      Requested Prescriptions  Pending Prescriptions Disp Refills   ONETOUCH VERIO test strip [Pharmacy Med Name: OneTouch Verio In Vitro Strip] 100 each 0    Sig: USE AS DIRECTED     Endocrinology: Diabetes - Testing Supplies Passed - 10/26/2021 11:32 AM      Passed - Valid encounter within last 12 months    Recent Outpatient Visits           6 months ago Proliferative diabetic retinopathy associated with type 1 diabetes mellitus, unspecified laterality, unspecified proliferative retinopathy type ([place])   [place] Susy Frizzle, MD   1 year ago Diabetes mellitus type 1, controlled, insulin dependent ([place])   [place] Susy Frizzle, MD   2 years ago Diabetes mellitus type 1, controlled, insulin dependent ([place])   [place] Susy Frizzle, MD   3 years ago Diabetes mellitus type 1, controlled, insulin dependent ([place])   [place] Pickard, Cammie Mcgee, MD   4 years ago Controlled diabetes mellitus type 1 with complications ([place])   [place] Pickard, Cammie Mcgee, MD       Future Appointments             In 1 week Pickard, Cammie Mcgee, MD [place]

## 2021-11-06 ENCOUNTER — Ambulatory Visit (INDEPENDENT_AMBULATORY_CARE_PROVIDER_SITE_OTHER): Payer: 59 | Admitting: Family Medicine

## 2021-11-06 VITALS — BP 122/68 | HR 107 | Temp 98.3°F | Ht 63.0 in | Wt 175.8 lb

## 2021-11-06 DIAGNOSIS — E103599 Type 1 diabetes mellitus with proliferative diabetic retinopathy without macular edema, unspecified eye: Secondary | ICD-10-CM

## 2021-11-06 DIAGNOSIS — F17200 Nicotine dependence, unspecified, uncomplicated: Secondary | ICD-10-CM

## 2021-11-06 DIAGNOSIS — E109 Type 1 diabetes mellitus without complications: Secondary | ICD-10-CM | POA: Diagnosis not present

## 2021-11-06 DIAGNOSIS — E7800 Pure hypercholesterolemia, unspecified: Secondary | ICD-10-CM

## 2021-11-06 MED ORDER — BLOOD GLUCOSE TEST STRIPS 333 VI STRP: 1.0000 | ORAL_STRIP | Freq: Three times a day (TID) | 3 refills | Status: AC

## 2021-11-06 NOTE — Progress Notes (Signed)
Subjective:    Patient ID: [name], female    DOB: 21-Jun-1975, 46 y.o.   MRN: [id number] Here for follow up of her diabetes mellitus 1.  Has  a history of diabetic retinopathy.  HgA1c was 8.6 in January.  Patient is currently on 40 units of Lantus.  She states that her fasting blood sugars are typically around 100.  She occasionally has a hypoglycemic episode.  Recently she is not requiring any fast acting insulin.  She denies any polyuria or polydipsia.  Diabetic foot exam was performed today and is normal.  Unfortunately she continues to smoke 5 to 10 cigarettes a day.  Blood pressure today is excellent at 122/68 Past Medical History:  Diagnosis Date   Asthma    Carpal tunnel syndrome on left    Complication of anesthesia 07/2014   Mouth was swollen on inside, Front tooth was chipped   Diabetes mellitus    19 years ago   Diabetic neuropathy ([place])    Diabetic retinopathy ([place])    Essential hypertension 12/23/2015   GERD (gastroesophageal reflux disease)    Headache    Migraine- a long time ago   Hypercholesteremia    Hyperlipidemia 12/23/2015   Hypertension    Muscle spasms of lower extremity    legs and weakness   Pneumonia    Restless leg    Type 1 diabetes mellitus ([place])    Vitreous hemorrhage ([place]) 08/08/2014   Vitreous hemorrhage of right eye ([place]) 08/28/2014   Vitreous hemorrhage, left eye ([place])    Past Surgical History:  Procedure Laterality Date   CARDIAC CATHETERIZATION N/A 12/27/2015   Procedure: Left Heart Cath and Coronary Angiography;  Surgeon: Troy Sine, MD;  Location: [facility];  Service: Cardiovascular;  Laterality: N/A;   [place], 2002   COLONOSCOPY WITH PROPOFOL N/A 06/16/2021   Procedure: COLONOSCOPY WITH PROPOFOL;  Surgeon: Eloise Harman, DO;  [facility];  Service: Endoscopy;  Laterality: N/A;  12:45 / ASA 2   DILATION AND CURETTAGE OF UTERUS  1997   GAS/FLUID EXCHANGE Right 08/28/2014   Procedure:  GAS/FLUID EXCHANGE;  Surgeon: Hayden Pedro, MD;  Location: [facility];  Service: Ophthalmology;  Laterality: Right;   LASER PHOTO ABLATION     left x 2, right x 1   LASER PHOTO ABLATION Right 08/28/2014   Procedure: LASER PHOTO ABLATION;  Surgeon: Hayden Pedro, MD;  Location: [facility];  Service: Ophthalmology;  Laterality: Right;   MEMBRANE PEEL Right 08/28/2014   Procedure: MEMBRANE PEEL;  Surgeon: Hayden Pedro, MD;  Location: [facility];  Service: Ophthalmology;  Laterality: Right;   PARS PLANA VITRECTOMY Right 08/28/2014   Procedure: PARS PLANA VITRECTOMY WITH 25 GAUGE;  Surgeon: Hayden Pedro, MD;  Location: [facility];  Service: Ophthalmology;  Laterality: Right;   PARS PLANA VITRECTOMY Left 12/24/2014   Procedure: LEFT PARS PLANA VITRECTOMY WITH 25 GAUGE WITH ENDOLASER ;  Surgeon: Hurman Horn, MD;  Location: [facility];  Service: Ophthalmology;  Laterality: Left;   POLYPECTOMY  06/16/2021   Procedure: POLYPECTOMY;  Surgeon: Eloise Harman, DO;  [facility];  Service: Endoscopy;;   TUBAL LIGATION  2002   WISDOM TOOTH EXTRACTION     age 16   Current Outpatient Medications on File Prior to Visit  Medication Sig Dispense Refill   albuterol (PROAIR HFA) 108 (90 Base) MCG/ACT inhaler 2 PUFFS EVERY 6 HOURS AS NEEDED for shortness of  breath 3 each 3   albuterol (PROVENTIL) (2.5 MG/3ML) 0.083% nebulizer solution Take 3 mLs (2.5 mg total) by nebulization every 4 (four) hours as needed for wheezing or shortness of breath. 150 mL 1   carvedilol (COREG) 25 MG tablet Take 1 tablet (25 mg total) by mouth daily at 6 (six) AM. (Patient taking differently: Take 25 mg by mouth daily.) 90 tablet 1   HUMALOG 100 UNIT/ML injection INJECT 3 TO 8 UNITS SUBCUTANEOUSLY THREE TIMES DAILY WITH MEALS 10 mL 2   insulin glargine-yfgn (SEMGLEE) 100 UNIT/ML injection Inject 0.35 mLs (35 Units total) into the skin daily. 45 mL 3   LANTUS SOLOSTAR 100 UNIT/ML Solostar Pen Inject 35 Units into the skin daily. Increase by  1 Unit each day until FBG is less than 130. Then hold at that dose. 45 mL 3   losartan (COZAAR) 100 MG tablet Take 1 tablet by mouth once daily 90 tablet 1   OVER THE COUNTER MEDICATION Take 2 tablets by mouth daily as needed (restless legs). Restless leg relief     No current facility-administered medications on file prior to visit.   Allergies  Allergen Reactions   Fish-Derived Products Hives and Shortness Of Breath   Demerol Itching and Rash   Morphine And Related Itching and Rash        Zocor [Simvastatin] Itching and Other (See Comments)    Leg pain   Social History   Socioeconomic History   Marital status: Married    Spouse name: Not on file   Number of children: Not on file   Years of education: Not on file   Highest education level: Not on file  Occupational History   Not on file  Tobacco Use   Smoking status: Every Day    Packs/day: 2.00    Years: 9.00    Total pack years: 18.00    Types: Cigarettes   Smokeless tobacco: Never  Substance and Sexual Activity   Alcohol use: No   Drug use: No   Sexual activity: Yes    Partners: Male    Birth control/protection: None, Surgical    Comment: tubal   Other Topics Concern   Not on file  Social History Narrative   Married, lives with spouse and 2 kids   Occupation: Chemical engineer   Social Determinants of Health   Financial Resource Strain: Not on file  Food Insecurity: Not on file  Transportation Needs: Not on file  Physical Activity: Not on file  Stress: Not on file  Social Connections: Not on file  Intimate Partner Violence: Not on file     Review of Systems  All other systems reviewed and are negative.      Objective:   Physical Exam Vitals reviewed.  Constitutional:      Appearance: She is well-developed.  Neck:     Thyroid: No thyromegaly.  Cardiovascular:     Rate and Rhythm: Normal rate and regular rhythm.     Heart sounds: Normal heart sounds. No murmur heard. Pulmonary:     Effort:  Pulmonary effort is normal. No respiratory distress.     Breath sounds: No decreased breath sounds, wheezing, rhonchi or rales.  Abdominal:     General: Bowel sounds are normal. There is no distension.     Palpations: Abdomen is soft.     Tenderness: There is no abdominal tenderness. There is no guarding or rebound.  Musculoskeletal:     Cervical back: Neck supple.  Lymphadenopathy:  Cervical: No cervical adenopathy.           Assessment & Plan:  Diabetes mellitus type 1, controlled, insulin dependent (HCC) - Plan: Lipid panel, Microalbumin, urine, Hemoglobin A1c, COMPLETE METABOLIC PANEL WITH GFR  Pure hypercholesterolemia  Tobacco use disorder  Proliferative diabetic retinopathy associated with type 1 diabetes mellitus, unspecified laterality, unspecified proliferative retinopathy type ([place]) Continue to encourage the patient to quit smoking.  I am very happy with her blood pressure.  Her blood sugars sound well-controlled.  I will check a urine microalbumin and if there is evidence of diabetic nephropathy, she may be a good candidate for Wilder [facility]ade even though she is a type I diabetic as this could help reduce the worsening of diabetic nephropathy.

## 2021-11-07 LAB — COMPLETE METABOLIC PANEL WITH GFR
AG Ratio: 1.7 (calc) (ref 1.0–2.5)
ALT: 24 U/L (ref 6–29)
AST: 20 U/L (ref 10–35)
Albumin: 4.3 g/dL (ref 3.6–5.1)
Alkaline phosphatase (APISO): 73 U/L (ref 31–125)
BUN: 16 mg/dL (ref 7–25)
CO2: 29 mmol/L (ref 20–32)
Calcium: 9.2 mg/dL (ref 8.6–10.2)
Chloride: 106 mmol/L (ref 98–110)
Creat: 0.73 mg/dL (ref 0.50–0.99)
Globulin: 2.5 g/dL (calc) (ref 1.9–3.7)
Glucose, Bld: 86 mg/dL (ref 65–99)
Potassium: 3.9 mmol/L (ref 3.5–5.3)
Sodium: 142 mmol/L (ref 135–146)
Total Bilirubin: 0.6 mg/dL (ref 0.2–1.2)
Total Protein: 6.8 g/dL (ref 6.1–8.1)
eGFR: 102 mL/min/{1.73_m2} (ref >=60)

## 2021-11-07 LAB — MICROALBUMIN, URINE: Microalb, Ur: 0.4 mg/dL

## 2021-11-07 LAB — HEMOGLOBIN A1C
Hgb A1c MFr Bld: 7.2 % of total Hgb — ABNORMAL HIGH (ref <5.7)
Mean Plasma Glucose: 160 mg/dL
eAG (mmol/L): 8.9 mmol/L

## 2021-11-07 LAB — LIPID PANEL
Cholesterol: 208 mg/dL — ABNORMAL HIGH (ref <200)
HDL: 32 mg/dL — ABNORMAL LOW (ref >=50)
LDL Cholesterol (Calc): 144 mg/dL (calc) — ABNORMAL HIGH
Non-HDL Cholesterol (Calc): 176 mg/dL (calc) — ABNORMAL HIGH (ref <130)
Total CHOL/HDL Ratio: 6.5 (calc) — ABNORMAL HIGH (ref <5.0)
Triglycerides: 187 mg/dL — ABNORMAL HIGH (ref <150)

## 2021-11-10 ENCOUNTER — Other Ambulatory Visit: Payer: Self-pay

## 2021-11-10 ENCOUNTER — Other Ambulatory Visit: Payer: Self-pay | Admitting: Family Medicine

## 2021-11-10 ENCOUNTER — Encounter: Payer: Self-pay | Admitting: Family Medicine

## 2021-11-10 MED ORDER — LOSARTAN POTASSIUM 100 MG PO TABS: 100.0000 mg | ORAL_TABLET | Freq: Every day | ORAL | 3 refills | Status: DC

## 2021-11-10 MED ORDER — INSULIN GLARGINE 100 UNIT/ML ~~LOC~~ SOLN: SUBCUTANEOUS | 11 refills | Status: DC

## 2021-11-10 MED ORDER — ALBUTEROL SULFATE (2.5 MG/3ML) 0.083% IN NEBU
2.5000 mg | INHALATION_SOLUTION | RESPIRATORY_TRACT | 1 refills | Status: DC | PRN
Start: 2021-11-10 — End: 2023-01-05

## 2021-11-10 MED ORDER — CARVEDILOL 25 MG PO TABS: 25.0000 mg | ORAL_TABLET | Freq: Every day | ORAL | 1 refills | Status: DC

## 2021-11-10 MED ORDER — ALBUTEROL SULFATE HFA 108 (90 BASE) MCG/ACT IN AERS: INHALATION_SPRAY | RESPIRATORY_TRACT | 3 refills | Status: AC

## 2021-11-10 MED ORDER — LANTUS SOLOSTAR 100 UNIT/ML ~~LOC~~ SOPN: 35.0000 [IU] | PEN_INJECTOR | Freq: Every day | SUBCUTANEOUS | 3 refills | Status: DC

## 2021-11-14 ENCOUNTER — Other Ambulatory Visit: Payer: Self-pay

## 2021-11-14 DIAGNOSIS — E7800 Pure hypercholesterolemia, unspecified: Secondary | ICD-10-CM

## 2021-11-14 MED ORDER — EZETIMIBE 10 MG PO TABS: 10.0000 mg | ORAL_TABLET | Freq: Every day | ORAL | 3 refills | Status: DC

## 2021-11-14 NOTE — Progress Notes (Signed)
Spoke w/pt. Per pt agreed to try Zetia '10mg'$  per Dr. Dennard Schaumann. Pt aware that Rx has been sent to pharmacy

## 2021-12-15 ENCOUNTER — Other Ambulatory Visit: Payer: Self-pay | Admitting: Family Medicine

## 2022-01-03 ENCOUNTER — Other Ambulatory Visit: Payer: Self-pay | Admitting: Family Medicine

## 2022-01-05 NOTE — Telephone Encounter (Signed)
Requested Prescriptions  Pending Prescriptions Disp Refills  . HUMALOG 100 UNIT/ML injection [Pharmacy Med Name: HumaLOG 100 UNIT/ML Subcutaneous Solution] 10 mL 1    Sig: INJECT 3 TO [place]     Endocrinology:  Diabetes - Insulins Failed - 01/03/2022  9:21 AM      Failed - Valid encounter within last 6 months    Recent Outpatient Visits          9 months ago Proliferative diabetic retinopathy associated with type 1 diabetes mellitus, unspecified laterality, unspecified proliferative retinopathy type ([place])   [place] Pickard, Cammie Mcgee, MD   1 year ago Diabetes mellitus type 1, controlled, insulin dependent ([place])   [place] Pickard, Cammie Mcgee, MD   2 years ago Diabetes mellitus type 1, controlled, insulin dependent ([place])   [place] Pickard, Cammie Mcgee, MD   3 years ago Diabetes mellitus type 1, controlled, insulin dependent ([place])   [place] Pickard, Cammie Mcgee, MD   4 years ago Controlled diabetes mellitus type 1 with complications ([place])   [facility] Pickard, Cammie Mcgee, MD             Passed - HBA1C is between 0 and 7.9 and within 180 days    Hgb A1c MFr Bld  Date Value Ref Range Status  11/06/2021 7.2 (H) <5.7 % of total Hgb Final    Comment:    For someone without known diabetes, a hemoglobin A1c value of 6.5% or greater indicates that they may have  diabetes and this should be confirmed with a follow-up  test. . For someone with known diabetes, a value <7% indicates  that their diabetes is well controlled and a value  greater than or equal to 7% indicates suboptimal  control. A1c targets should be individualized based on  duration of diabetes, age, comorbid conditions, and  other considerations. . Currently, no consensus exists regarding use of hemoglobin A1c for diagnosis of diabetes for children. Marland Kitchen

## 2022-02-23 ENCOUNTER — Other Ambulatory Visit: Payer: Self-pay

## 2022-02-23 ENCOUNTER — Encounter: Payer: Self-pay | Admitting: *Deleted

## 2022-02-23 ENCOUNTER — Ambulatory Visit
Admission: EM | Admit: 2022-02-23 | Discharge: 2022-02-23 | Disposition: A | Discharge status: Home / Self Care | Payer: 59 | Attending: Family Medicine | Admitting: Family Medicine

## 2022-02-23 DIAGNOSIS — J4521 Mild intermittent asthma with (acute) exacerbation: Secondary | ICD-10-CM

## 2022-02-23 DIAGNOSIS — J0100 Acute maxillary sinusitis, unspecified: Secondary | ICD-10-CM

## 2022-02-23 MED ORDER — FLUTICASONE PROPIONATE HFA 110 MCG/ACT IN AERO: 2.0000 | INHALATION_SPRAY | Freq: Two times a day (BID) | RESPIRATORY_TRACT | 0 refills | Status: DC

## 2022-02-23 MED ORDER — FLUCONAZOLE 150 MG PO TABS: 150.0000 mg | ORAL_TABLET | Freq: Once | ORAL | 0 refills | Status: AC

## 2022-02-23 MED ORDER — PROMETHAZINE-DM 6.25-15 MG/5ML PO SYRP: 5.0000 mL | ORAL_SOLUTION | Freq: Four times a day (QID) | ORAL | 0 refills | Status: DC | PRN

## 2022-02-23 MED ORDER — AMOXICILLIN-POT CLAVULANATE 875-125 MG PO TABS: 1.0000 | ORAL_TABLET | Freq: Two times a day (BID) | ORAL | 0 refills | Status: DC
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
## 2022-03-09 ENCOUNTER — Encounter (INDEPENDENT_AMBULATORY_CARE_PROVIDER_SITE_OTHER): Payer: PRIVATE HEALTH INSURANCE | Admitting: Ophthalmology

## 2022-03-20 ENCOUNTER — Other Ambulatory Visit: Payer: Self-pay

## 2022-03-20 ENCOUNTER — Other Ambulatory Visit: Payer: Self-pay | Admitting: Family Medicine

## 2022-03-20 DIAGNOSIS — J45909 Unspecified asthma, uncomplicated: Secondary | ICD-10-CM

## 2022-03-20 MED ORDER — FLUTICASONE PROPIONATE HFA 110 MCG/ACT IN AERO: 2.0000 | INHALATION_SPRAY | Freq: Two times a day (BID) | RESPIRATORY_TRACT | 1 refills | Status: DC

## 2022-03-20 NOTE — Telephone Encounter (Signed)
Requested medication (s) are due for refill today: yes  Requested medication (s) are on the active medication list: yes  Last refill:  02/23/22  Future visit scheduled: no  Notes to clinic:  Unable to refill per protocol, last refill by another provider.     Requested Prescriptions  Pending Prescriptions Disp Refills   FLOVENT HFA 110 MCG/ACT inhaler [Pharmacy Med Name: Flovent HFA 110 MCG/ACT Inhalation Aerosol] 12 g 0    Sig: INHALE 2 PUFFS BY MOUTH TWICE DAILY. RINSE MOUTH WITH WATER AFTER EACH USE     There is no refill protocol information for this order

## 2022-04-23 ENCOUNTER — Other Ambulatory Visit: Payer: Self-pay | Admitting: Family Medicine

## 2022-04-23 NOTE — Telephone Encounter (Signed)
Requested medication (s) are due for refill today: Yes  Requested medication (s) are on the active medication list: Yes  Last refill:  03/20/22  Future visit scheduled: No  Notes to clinic:  Unable to refill per protocol, cannot delegate.      Requested Prescriptions  Pending Prescriptions Disp Refills   FLOVENT HFA 110 MCG/ACT inhaler [Pharmacy Med Name: Flovent HFA 110 MCG/ACT Inhalation Aerosol] 12 g 0    Sig: INHALE 2 PUFFS BY MOUTH TWICE DAILY. RINSE MOUTH WITH WATER AFTER EACH USE     Not Delegated - Pulmonology:  Corticosteroids 2 Failed - 04/23/2022  1:36 PM      Failed - This refill cannot be delegated      Failed - Valid encounter within last 12 months    Recent Outpatient Visits           1 year ago Proliferative diabetic retinopathy associated with type 1 diabetes mellitus, unspecified laterality, unspecified proliferative retinopathy type ([place])   [place] Susy Frizzle, MD   2 years ago Diabetes mellitus type 1, controlled, insulin dependent ([place])   [place] Susy Frizzle, MD   3 years ago Diabetes mellitus type 1, controlled, insulin dependent ([place])   [place] Susy Frizzle, MD   4 years ago Diabetes mellitus type 1, controlled, insulin dependent ([place])   [place] Pickard, Cammie Mcgee, MD   4 years ago Controlled diabetes mellitus type 1 with complications ([place])   [facility] Pickard, Cammie Mcgee, MD

## 2022-05-05 ENCOUNTER — Other Ambulatory Visit: Payer: Self-pay | Admitting: Family Medicine

## 2022-05-05 MED ORDER — FLUTICASONE FUROATE 100 MCG/ACT IN AEPB: 2.0000 | INHALATION_SPRAY | Freq: Two times a day (BID) | RESPIRATORY_TRACT | 11 refills | Status: AC

## 2022-06-02 ENCOUNTER — Ambulatory Visit
Admission: EM | Admit: 2022-06-02 | Discharge: 2022-06-02 | Disposition: A | Discharge status: Home / Self Care | Payer: 59 | Attending: Nurse Practitioner | Admitting: Nurse Practitioner

## 2022-06-02 DIAGNOSIS — J4521 Mild intermittent asthma with (acute) exacerbation: Secondary | ICD-10-CM

## 2022-06-02 MED ORDER — PREDNISONE 20 MG PO TABS: 40.0000 mg | ORAL_TABLET | Freq: Every day | ORAL | 0 refills | Status: AC

## 2022-06-02 MED ORDER — BENZONATATE 100 MG PO CAPS: 100.0000 mg | ORAL_CAPSULE | Freq: Three times a day (TID) | ORAL | 0 refills | Status: DC | PRN

## 2022-06-02 MED ORDER — METHYLPREDNISOLONE SODIUM SUCC 125 MG IJ SOLR
60.0000 mg | Freq: Once | INTRAMUSCULAR | Status: AC
  Administered 2022-06-02: 60 mg via INTRAMUSCULAR

## 2022-06-02 MED ORDER — PROMETHAZINE-DM 6.25-15 MG/5ML PO SYRP: 5.0000 mL | ORAL_SOLUTION | Freq: Every evening | ORAL | 0 refills | Status: DC | PRN

## 2022-06-02 NOTE — Discharge Instructions (Signed)
As we discussed, you are likely having an exacerbation of your breathing from a viral cause or possible allergic cause.   We have given you a shot of Solu-Medrol today to help with inflammation in your lungs.  Start the oral prednisone tomorrow morning.  You can continue to use albuterol inhaler or nebulizer every 4-6 hours as needed for wheezing or shortness of breath.  Start the cough suppressants to help with the dry cough.  Also recommend starting an oral antihistamine daily like Zyrtec, Claritin, Allegra, or Xyzal.  You can also start on a nasal spray like Flonase to help prevent allergic attacks.

## 2022-06-02 NOTE — ED Triage Notes (Signed)
Pt reports she has a cough with mucus, wheezing, lymph nodes are sore x 2 weeks. Took mucinex, salt water gargling, and her neb treatment.

## 2022-06-02 NOTE — ED Provider Notes (Signed)
[facility]    CSN: [id number] Arrival date & time: 06/02/22  1401      History   Chief Complaint No chief complaint on file.   HPI [name] is a 47 y.o. female.   Patient presents today with more than 1 week day history of cough.  Also having congestion, congested cough, shortness of breath, wheezing, chest congestion, nasal congestion, runny nose, sinus pressure, and fatigue.  Patient denies fever, sore throat, body aches, chills, dry cough, chest pain with coughing, chest tightness, post nasal drainage, headache, ear pain, abdominal pain, nausea, vomiting, diarrhea, decreased appetite, loss of taste, loss of smell, and rash. Has tried rescue inahler and nebulizer, last use was over an hour ago as well as Mucinex and Benadryl with minimal improvement.    Past Medical History:  Diagnosis Date   Asthma    Carpal tunnel syndrome on left    Complication of anesthesia 07/2014   Mouth was swollen on inside, Front tooth was chipped   Diabetes mellitus    19 years ago   Diabetic neuropathy ([place])    Diabetic retinopathy ([place])    Essential hypertension 12/23/2015   GERD (gastroesophageal reflux disease)    Headache    Migraine- a long time ago   Hypercholesteremia    Hyperlipidemia 12/23/2015   Hypertension    Muscle spasms of lower extremity    legs and weakness   Pneumonia    Restless leg    Statin myopathy    Type 1 diabetes mellitus ([place])    Vitreous hemorrhage ([place]) 08/08/2014   Vitreous hemorrhage of right eye ([place]) 08/28/2014   Vitreous hemorrhage, left eye ([place])     Patient Active Problem List   Diagnosis Date Noted   Stable treated proliferative diabetic retinopathy of right eye determined by examination associated with type 1 diabetes mellitus ([place]) 05/22/2021   Stable treated proliferative diabetic retinopathy of left eye without macular edema determined by examination associated with type 1 diabetes mellitus ([place]) 05/22/2021   Nuclear sclerotic  cataract of both eyes 05/22/2021   Carpal tunnel syndrome, bilateral 04/13/2017   Chest pain with moderate risk for cardiac etiology    Angina pectoris ([place])    Essential hypertension 12/23/2015   Hyperlipidemia 12/23/2015   Tobacco use disorder 02/25/2015   Proliferative diabetic retinopathy ([place]) 08/08/2014   Asthma with bronchitis 12/26/2012   Type II diabetes mellitus ([place]) 12/26/2012   Abnormal HPV Pap smear, poor sample 01/29/2011    Past Surgical History:  Procedure Laterality Date   CARDIAC CATHETERIZATION N/A 12/27/2015   Procedure: Left Heart Cath and Coronary Angiography;  Surgeon: Troy Sine, MD;  Location: [facility];  Service: Cardiovascular;  Laterality: N/A;   [place], 2002   COLONOSCOPY WITH PROPOFOL N/A 06/16/2021   Procedure: COLONOSCOPY WITH PROPOFOL;  Surgeon: Eloise Harman, DO;  [facility];  Service: Endoscopy;  Laterality: N/A;  12:45 / ASA 2   DILATION AND CURETTAGE OF UTERUS  1997   GAS/FLUID EXCHANGE Right 08/28/2014   Procedure: GAS/FLUID EXCHANGE;  Surgeon: Hayden Pedro, MD;  Location: [facility];  Service: Ophthalmology;  Laterality: Right;   LASER PHOTO ABLATION     left x 2, right x 1   LASER PHOTO ABLATION Right 08/28/2014   Procedure: LASER PHOTO ABLATION;  Surgeon: Hayden Pedro, MD;  Location: [place];  Service: Ophthalmology;  Laterality: Right;   MEMBRANE PEEL Right 08/28/2014   Procedure: MEMBRANE PEEL;  Surgeon: Hayden Pedro, MD;  Location: [place];  Service: Ophthalmology;  Laterality: Right;   PARS PLANA VITRECTOMY Right 08/28/2014   Procedure: PARS PLANA VITRECTOMY WITH 25 GAUGE;  Surgeon: Hayden Pedro, MD;  Location: [place];  Service: Ophthalmology;  Laterality: Right;   PARS PLANA VITRECTOMY Left 12/24/2014   Procedure: LEFT PARS PLANA VITRECTOMY WITH 25 GAUGE WITH ENDOLASER ;  Surgeon: Hurman Horn, MD;  Location: [facility];  Service: Ophthalmology;  Laterality: Left;   POLYPECTOMY  06/16/2021    Procedure: POLYPECTOMY;  Surgeon: Eloise Harman, DO;  [facility];  Service: Endoscopy;;   TUBAL LIGATION  2002   WISDOM TOOTH EXTRACTION     age 87    OB History     Gravida  4   Para  3   Term  2   Preterm  1   AB  1   Living  3      SAB      IAB      Ectopic      Multiple      Live Births               Home Medications    Prior to Admission medications   Medication Sig Start Date End Date Taking? Authorizing Provider  benzonatate (TESSALON) 100 MG capsule Take 1 capsule (100 mg total) by mouth 3 (three) times daily as needed for cough. Do not take with alcohol or while driving or operating heavy machinery.  May cause drowsiness. 06/02/22  Yes Eulogio Bear, NP  predniSONE (DELTASONE) 20 MG tablet Take 2 tablets (40 mg total) by mouth daily with breakfast for 5 days. 06/02/22 06/07/22 Yes Eulogio Bear, NP  albuterol (PROAIR HFA) 108 (90 Base) MCG/ACT inhaler 2 PUFFS EVERY 6 HOURS AS NEEDED for shortness of breath 11/10/21   Susy Frizzle, MD  albuterol (PROVENTIL) (2.5 MG/3ML) 0.083% nebulizer solution Take 3 mLs (2.5 mg total) by nebulization every 4 (four) hours as needed for wheezing or shortness of breath. 11/10/21   Susy Frizzle, MD  carvedilol (COREG) 25 MG tablet Take 1 tablet (25 mg total) by mouth daily at 6 (six) AM. 11/10/21   Pickard, Cammie Mcgee, MD  ezetimibe (ZETIA) 10 MG tablet Take 1 tablet (10 mg total) by mouth daily. 11/14/21   Susy Frizzle, MD  FLOVENT HFA 110 MCG/ACT inhaler INHALE 2 PUFFS BY MOUTH TWICE DAILY. RINSE MOUTH WITH WATER AFTER EACH USE 04/23/22   Susy Frizzle, MD  Fluticasone Furoate 100 MCG/ACT AEPB Inhale 2 puffs into the lungs 2 (two) times daily. 05/05/22   Susy Frizzle, MD  Glucose Blood (BLOOD GLUCOSE TEST STRIPS 333) STRP 1 strip by In Vitro route in the morning, at noon, and at bedtime. 11/06/21   Susy Frizzle, MD  HUMALOG 100 UNIT/ML injection INJECT 3 TO 8 UNITS SUBCUTANEOUSLY  THREE TIMES DAILY WITH MEALS 01/05/22   Susy Frizzle, MD  LANTUS SOLOSTAR 100 UNIT/ML Solostar Pen Inject 35 Units into the skin daily. Increase by 1 Unit each day until FBG is less than 130. Then hold at that dose. 11/10/21   Susy Frizzle, MD  losartan (COZAAR) 100 MG tablet Take 1 tablet (100 mg total) by mouth daily. 11/10/21   Susy Frizzle, MD  OVER THE COUNTER MEDICATION Take 2 tablets by mouth daily as needed (restless legs). Restless leg relief    [provider]  promethazine-dextromethorphan (PROMETHAZINE-DM) 6.25-15  MG/5ML syrup Take 5 mLs by mouth at bedtime as needed. Do not take with alcohol or while driving or operating heavy machinery.  May cause drowsiness. 06/02/22   Eulogio Bear, NP  SEMGLEE 100 UNIT/ML injection INJECT 35 UNITS SUBCUTANEOUSLY ONCE DAILY 11/10/21   Susy Frizzle, MD    Family History Family History  Problem Relation Age of Onset   Diabetes Mother    Parkinsonism Mother    Diabetes Father    Hypertension Father    Diabetes Brother    Heart failure Maternal Grandmother    Diabetes Maternal Grandmother     Social History Social History   Tobacco Use   Smoking status: Every Day    Packs/day: 2.00    Years: 9.00    Total pack years: 18.00    Types: Cigarettes   Smokeless tobacco: Never  Substance Use Topics   Alcohol use: No   Drug use: No     Allergies   Fish-derived products, Demerol, Morphine and related, and Zocor [simvastatin]   Review of Systems Review of Systems Per HPI  Physical Exam Triage Vital Signs ED Triage Vitals  Enc Vitals Group     BP 06/02/22 1414 (!) 143/81     Pulse Rate 06/02/22 1414 (!) 119     Resp 06/02/22 1414 (!) 22     Temp 06/02/22 1414 98.6 F (37 C)     Temp Source 06/02/22 1414 Oral     SpO2 06/02/22 1414 98 %     Weight --      Height --      Head Circumference --      Peak Flow --      Pain Score 06/02/22 1420 0     Pain Loc --      Pain Edu? --      Excl. in [place]?  --    No data found.  Updated Vital Signs BP (!) 143/81 (BP Location: Right Arm)   Pulse (!) 119   Temp 98.6 F (37 C) (Oral)   Resp (!) 22   SpO2 98%   Visual Acuity Right Eye Distance:   Left Eye Distance:   Bilateral Distance:    Right Eye Near:   Left Eye Near:    Bilateral Near:     Physical Exam Vitals and nursing note reviewed.  Constitutional:      General: She is not in acute distress.    Appearance: Normal appearance. She is not ill-appearing or toxic-appearing.  HENT:     Head: Normocephalic and atraumatic.     Right Ear: Tympanic membrane, ear canal and external ear normal.     Left Ear: Tympanic membrane, ear canal and external ear normal.     Nose: No congestion or rhinorrhea.     Mouth/Throat:     Mouth: Mucous membranes are moist.     Pharynx: Oropharynx is clear. No oropharyngeal exudate or posterior oropharyngeal erythema.  Eyes:     General: No scleral icterus.    Extraocular Movements: Extraocular movements intact.  Cardiovascular:     Rate and Rhythm: Normal rate and regular rhythm.  Pulmonary:     Effort: Pulmonary effort is normal. No respiratory distress.     Breath sounds: Wheezing present. No rhonchi or rales.  Abdominal:     General: Abdomen is flat. Bowel sounds are normal. There is no distension.     Palpations: Abdomen is soft.     Tenderness: There is no abdominal tenderness.  Musculoskeletal:     Cervical back: Normal range of motion and neck supple.  Lymphadenopathy:     Cervical: No cervical adenopathy.  Skin:    General: Skin is warm and dry.     Coloration: Skin is not jaundiced or pale.     Findings: No erythema or rash.  Neurological:     Mental Status: She is alert and oriented to person, place, and time.  Psychiatric:        Behavior: Behavior is cooperative.      UC Treatments / Results  Labs (all labs ordered are listed, but only abnormal results are displayed) Labs Reviewed - No data to  display  EKG   Radiology No results found.  Procedures Procedures (including critical care time)  Medications Ordered in UC Medications  methylPREDNISolone sodium succinate (SOLU-MEDROL) 125 mg/2 mL injection 60 mg (60 mg Intramuscular Given 06/02/22 1450)    Initial Impression / Assessment and Plan / UC Course  I have reviewed the triage vital signs and the nursing notes.  Pertinent labs & imaging results that were available during my care of the patient were reviewed by me and considered in my medical decision making (see chart for details).   Patient is well-appearing, normotensive, afebrile, oxygenating well on room air.  She is mildly tachycardic and tachypneic in triage, likely secondary to acute illness.  1. Mild intermittent asthma with acute exacerbation Will treat with IM Solu-Medrol today in urgent care, oral prednisone starting tomorrow Recommended continued use of inhaler or nebulizer as needed for wheezing or shortness of breath Start cough suppressant ER and return precautions discussed with patient  The patient was given the opportunity to ask questions.  All questions answered to their satisfaction.  The patient is in agreement to this plan.    Final Clinical Impressions(s) / UC Diagnoses   Final diagnoses:  Mild intermittent asthma with acute exacerbation     Discharge Instructions      As we discussed, you are likely having an exacerbation of your breathing from a viral cause or possible allergic cause.   We have given you a shot of Solu-Medrol today to help with inflammation in your lungs.  Start the oral prednisone tomorrow morning.  You can continue to use albuterol inhaler or nebulizer every 4-6 hours as needed for wheezing or shortness of breath.  Start the cough suppressants to help with the dry cough.  Also recommend starting an oral antihistamine daily like Zyrtec, Claritin, Allegra, or Xyzal.  You can also start on a nasal spray like Flonase to  help prevent allergic attacks.     ED Prescriptions     Medication Sig Dispense Auth. Provider   promethazine-dextromethorphan (PROMETHAZINE-DM) 6.25-15 MG/5ML syrup Take 5 mLs by mouth at bedtime as needed. Do not take with alcohol or while driving or operating heavy machinery.  May cause drowsiness. 100 mL Noemi Chapel A, NP   benzonatate (TESSALON) 100 MG capsule Take 1 capsule (100 mg total) by mouth 3 (three) times daily as needed for cough. Do not take with alcohol or while driving or operating heavy machinery.  May cause drowsiness. 30 capsule Noemi Chapel A, NP   predniSONE (DELTASONE) 20 MG tablet Take 2 tablets (40 mg total) by mouth daily with breakfast for 5 days. 10 tablet Eulogio Bear, NP      PDMP not reviewed this encounter.   Eulogio Bear, NP 06/02/22 [phone]

## 2022-06-09 ENCOUNTER — Ambulatory Visit: Payer: 59 | Admitting: Family Medicine

## 2022-06-22 ENCOUNTER — Encounter: Payer: Self-pay | Admitting: Family Medicine

## 2022-06-22 ENCOUNTER — Ambulatory Visit (INDEPENDENT_AMBULATORY_CARE_PROVIDER_SITE_OTHER): Payer: 59 | Admitting: Family Medicine

## 2022-06-22 VITALS — BP 118/72 | HR 107 | Ht 63.0 in | Wt 171.8 lb

## 2022-06-22 DIAGNOSIS — I10 Essential (primary) hypertension: Secondary | ICD-10-CM

## 2022-06-22 DIAGNOSIS — F17200 Nicotine dependence, unspecified, uncomplicated: Secondary | ICD-10-CM

## 2022-06-22 DIAGNOSIS — E109 Type 1 diabetes mellitus without complications: Secondary | ICD-10-CM

## 2022-06-22 DIAGNOSIS — E103599 Type 1 diabetes mellitus with proliferative diabetic retinopathy without macular edema, unspecified eye: Secondary | ICD-10-CM | POA: Diagnosis not present

## 2022-06-22 MED ORDER — MONTELUKAST SODIUM 10 MG PO TABS: 10.0000 mg | ORAL_TABLET | Freq: Every day | ORAL | 11 refills | Status: AC

## 2022-06-22 MED ORDER — TERBINAFINE HCL 250 MG PO TABS: 250.0000 mg | ORAL_TABLET | Freq: Every day | ORAL | 2 refills | Status: DC

## 2022-06-22 NOTE — Progress Notes (Signed)
Subjective:    Patient ID: [name], female    DOB: 03/09/1976, 47 y.o.   MRN: [id number]  Unfortunately the patient continues to smoke.  She is on 30 units of long-acting insulin once a day and then 3 to 8 units of short acting insulin with meals.  She states that her blood sugars are typically around 130 in the morning.  She does occasionally have some hypoglycemic episodes.  She needs to switch her short acting insulin to something that her insurance will cover.  Otherwise she is doing well.  She does report more trouble with her asthma when her allergies act up.  She is currently on Flonase and Claritin.  She has never tried Singulair.  She states that she is only having to use her rescue inhaler 1 or 2 times a month however her allergies seem to be the trigger.  She is not taking any inhaled steroid due to cost.  She also does not feel that she benefits from them she only uses her inhaler 1-2 times a month.  Past Medical History:  Diagnosis Date   Asthma    Carpal tunnel syndrome on left    Complication of anesthesia 07/2014   Mouth was swollen on inside, Front tooth was chipped   Diabetes mellitus    19 years ago   Diabetic neuropathy ([place])    Diabetic retinopathy ([place])    Essential hypertension 12/23/2015   GERD (gastroesophageal reflux disease)    Headache    Migraine- a long time ago   Hypercholesteremia    Hyperlipidemia 12/23/2015   Hypertension    Muscle spasms of lower extremity    legs and weakness   Pneumonia    Restless leg    Statin myopathy    Type 1 diabetes mellitus ([place])    Vitreous hemorrhage ([place]) 08/08/2014   Vitreous hemorrhage of right eye ([place]) 08/28/2014   Vitreous hemorrhage, left eye ([place])    Past Surgical History:  Procedure Laterality Date   CARDIAC CATHETERIZATION N/A 12/27/2015   Procedure: Left Heart Cath and Coronary Angiography;  Surgeon: Troy Sine, MD;  Location: [facility];  Service: Cardiovascular;  Laterality: N/A;    [place], 2002   COLONOSCOPY WITH PROPOFOL N/A 06/16/2021   Procedure: COLONOSCOPY WITH PROPOFOL;  Surgeon: Eloise Harman, DO;  [facility];  Service: Endoscopy;  Laterality: N/A;  12:45 / ASA 2   DILATION AND CURETTAGE OF UTERUS  1997   GAS/FLUID EXCHANGE Right 08/28/2014   Procedure: GAS/FLUID EXCHANGE;  Surgeon: Hayden Pedro, MD;  Location: [facility];  Service: Ophthalmology;  Laterality: Right;   LASER PHOTO ABLATION     left x 2, right x 1   LASER PHOTO ABLATION Right 08/28/2014   Procedure: LASER PHOTO ABLATION;  Surgeon: Hayden Pedro, MD;  Location: [facility];  Service: Ophthalmology;  Laterality: Right;   MEMBRANE PEEL Right 08/28/2014   Procedure: MEMBRANE PEEL;  Surgeon: Hayden Pedro, MD;  Location: [facility];  Service: Ophthalmology;  Laterality: Right;   PARS PLANA VITRECTOMY Right 08/28/2014   Procedure: PARS PLANA VITRECTOMY WITH 25 GAUGE;  Surgeon: Hayden Pedro, MD;  Location: [facility];  Service: Ophthalmology;  Laterality: Right;   PARS PLANA VITRECTOMY Left 12/24/2014   Procedure: LEFT PARS PLANA VITRECTOMY WITH 25 GAUGE WITH ENDOLASER ;  Surgeon: Hurman Horn, MD;  Location: [facility];  Service: Ophthalmology;  Laterality: Left;   POLYPECTOMY  06/16/2021  Procedure: POLYPECTOMY;  Surgeon: Eloise Harman, DO;  [facility];  Service: Endoscopy;;   TUBAL LIGATION  2002   WISDOM TOOTH EXTRACTION     age 65   Current Outpatient Medications on File Prior to Visit  Medication Sig Dispense Refill   albuterol (PROAIR HFA) 108 (90 Base) MCG/ACT inhaler 2 PUFFS EVERY 6 HOURS AS NEEDED for shortness of breath 3 each 3   albuterol (PROVENTIL) (2.5 MG/3ML) 0.083% nebulizer solution Take 3 mLs (2.5 mg total) by nebulization every 4 (four) hours as needed for wheezing or shortness of breath. 150 mL 1   benzonatate (TESSALON) 100 MG capsule Take 1 capsule (100 mg total) by mouth 3 (three) times daily as needed for cough. Do not take with alcohol or  while driving or operating heavy machinery.  May cause drowsiness. 30 capsule 0   carvedilol (COREG) 25 MG tablet Take 1 tablet (25 mg total) by mouth daily at 6 (six) AM. 90 tablet 1   ezetimibe (ZETIA) 10 MG tablet Take 1 tablet (10 mg total) by mouth daily. 90 tablet 3   FLOVENT HFA 110 MCG/ACT inhaler INHALE 2 PUFFS BY MOUTH TWICE DAILY. RINSE MOUTH WITH WATER AFTER EACH USE 12 g 0   Fluticasone Furoate 100 MCG/ACT AEPB Inhale 2 puffs into the lungs 2 (two) times daily. 30 each 11   Glucose Blood (BLOOD GLUCOSE TEST STRIPS 333) STRP 1 strip by In Vitro route in the morning, at noon, and at bedtime. 300 strip 3   HUMALOG 100 UNIT/ML injection INJECT 3 TO 8 UNITS SUBCUTANEOUSLY THREE TIMES DAILY WITH MEALS 10 mL 1   LANTUS SOLOSTAR 100 UNIT/ML Solostar Pen Inject 35 Units into the skin daily. Increase by 1 Unit each day until FBG is less than 130. Then hold at that dose. 45 mL 3   losartan (COZAAR) 100 MG tablet Take 1 tablet (100 mg total) by mouth daily. 90 tablet 3   OVER THE COUNTER MEDICATION Take 2 tablets by mouth daily as needed (restless legs). Restless leg relief     promethazine-dextromethorphan (PROMETHAZINE-DM) 6.25-15 MG/5ML syrup Take 5 mLs by mouth at bedtime as needed. Do not take with alcohol or while driving or operating heavy machinery.  May cause drowsiness. 100 mL 0   SEMGLEE 100 UNIT/ML injection INJECT 35 UNITS SUBCUTANEOUSLY ONCE DAILY 10 mL 11   No current facility-administered medications on file prior to visit.   Allergies  Allergen Reactions   Fish-Derived Products Hives and Shortness Of Breath   Demerol Itching and Rash   Morphine And Related Itching and Rash        Zocor [Simvastatin] Itching and Other (See Comments)    Leg pain   Social History   Socioeconomic History   Marital status: Married    Spouse name: Not on file   Number of children: Not on file   Years of education: Not on file   Highest education level: Not on file  Occupational History    Not on file  Tobacco Use   Smoking status: Every Day    Packs/day: 2.00    Years: 9.00    Additional pack years: 0.00    Total pack years: 18.00    Types: Cigarettes   Smokeless tobacco: Never  Substance and Sexual Activity   Alcohol use: No   Drug use: No   Sexual activity: Yes    Partners: Male    Birth control/protection: None, Surgical    Comment: tubal  Other Topics Concern   Not on file  Social History Narrative   Married, lives with spouse and 2 kids   Occupation: Chemical engineer   Social Determinants of Health   Financial Resource Strain: Not on file  Food Insecurity: Not on file  Transportation Needs: Not on file  Physical Activity: Not on file  Stress: Not on file  Social Connections: Not on file  Intimate Partner Violence: Not on file     Review of Systems  All other systems reviewed and are negative.      Objective:   Physical Exam Vitals reviewed.  Constitutional:      Appearance: She is well-developed.  Neck:     Thyroid: No thyromegaly.  Cardiovascular:     Rate and Rhythm: Normal rate and regular rhythm.     Heart sounds: Normal heart sounds. No murmur heard. Pulmonary:     Effort: Pulmonary effort is normal. No respiratory distress.     Breath sounds: No decreased breath sounds, wheezing, rhonchi or rales.  Abdominal:     General: Bowel sounds are normal. There is no distension.     Palpations: Abdomen is soft.     Tenderness: There is no abdominal tenderness. There is no guarding or rebound.  Musculoskeletal:     Cervical back: Neck supple.  Lymphadenopathy:     Cervical: No cervical adenopathy.           Assessment & Plan:  Diabetes mellitus type 1, controlled, insulin dependent ([place]) - Plan: CBC with Differential/Platelet, COMPLETE METABOLIC PANEL WITH GFR, Lipid panel, Hemoglobin A1c, Protein / Creatinine Ratio, Urine  Proliferative diabetic retinopathy associated with type 1 diabetes mellitus, unspecified laterality,  unspecified proliferative retinopathy type ([place])  Tobacco use disorder  Essential hypertension Strongly recommended smoking cessation.  We discussed an inhaled steroid such as Flovent/Pulmicort versus trying a leukotrienes inhibitor such as Singulair.  Patient would like to try Singulair as she deals more with her allergies on a daily basis and the Flonase and the Claritin or not controlling the allergies.  If she continues to have to use her rescue inhaler more than 2 times a week I would recommend adding an inhaled steroid.  Check a hemoglobin A1c along with a fasting lipid panel and a urine protein to creatinine ratio.  Diabetic foot exam was performed today and was normal

## 2022-06-23 LAB — LIPID PANEL
Cholesterol: 224 mg/dL — ABNORMAL HIGH (ref <200)
HDL: 35 mg/dL — ABNORMAL LOW (ref >=50)
LDL Cholesterol (Calc): 166 mg/dL (calc) — ABNORMAL HIGH
Non-HDL Cholesterol (Calc): 189 mg/dL (calc) — ABNORMAL HIGH (ref <130)
Total CHOL/HDL Ratio: 6.4 (calc) — ABNORMAL HIGH (ref <5.0)
Triglycerides: 117 mg/dL (ref <150)

## 2022-06-23 LAB — CBC WITH DIFFERENTIAL/PLATELET
Absolute Monocytes: 532 cells/uL (ref 200–950)
Basophils Absolute: 30 cells/uL (ref 0–200)
Basophils Relative: 0.4 %
Eosinophils Absolute: 91 cells/uL (ref 15–500)
Eosinophils Relative: 1.2 %
HCT: 46.6 % — ABNORMAL HIGH (ref 35.0–45.0)
Hemoglobin: 15.7 g/dL — ABNORMAL HIGH (ref 11.7–15.5)
Lymphs Abs: 2713 cells/uL (ref 850–3900)
MCH: 30.5 pg (ref 27.0–33.0)
MCHC: 33.7 g/dL (ref 32.0–36.0)
MCV: 90.7 fL (ref 80.0–100.0)
MPV: 11.8 fL (ref 7.5–12.5)
Monocytes Relative: 7 %
Neutro Abs: 4233 cells/uL (ref 1500–7800)
Neutrophils Relative %: 55.7 %
Platelets: 212 10*3/uL (ref 140–400)
RBC: 5.14 10*6/uL — ABNORMAL HIGH (ref 3.80–5.10)
RDW: 11.9 % (ref 11.0–15.0)
Total Lymphocyte: 35.7 %
WBC: 7.6 10*3/uL (ref 3.8–10.8)

## 2022-06-23 LAB — HEMOGLOBIN A1C
Hgb A1c MFr Bld: 8.6 % of total Hgb — ABNORMAL HIGH (ref <5.7)
Mean Plasma Glucose: 200 mg/dL
eAG (mmol/L): 11.1 mmol/L

## 2022-06-23 LAB — COMPLETE METABOLIC PANEL WITH GFR
AG Ratio: 1.7 (calc) (ref 1.0–2.5)
ALT: 34 U/L — ABNORMAL HIGH (ref 6–29)
AST: 22 U/L (ref 10–35)
Albumin: 4.3 g/dL (ref 3.6–5.1)
Alkaline phosphatase (APISO): 80 U/L (ref 31–125)
BUN: 14 mg/dL (ref 7–25)
CO2: 28 mmol/L (ref 20–32)
Calcium: 9.6 mg/dL (ref 8.6–10.2)
Chloride: 104 mmol/L (ref 98–110)
Creat: 0.75 mg/dL (ref 0.50–0.99)
Globulin: 2.5 g/dL (calc) (ref 1.9–3.7)
Glucose, Bld: 142 mg/dL — ABNORMAL HIGH (ref 65–99)
Potassium: 4.2 mmol/L (ref 3.5–5.3)
Sodium: 142 mmol/L (ref 135–146)
Total Bilirubin: 0.5 mg/dL (ref 0.2–1.2)
Total Protein: 6.8 g/dL (ref 6.1–8.1)
eGFR: 98 mL/min/{1.73_m2} (ref >=60)

## 2022-06-23 LAB — PROTEIN / CREATININE RATIO, URINE
Creatinine, Urine: 187 mg/dL (ref 20–275)
Protein/Creat Ratio: 64 mg/g creat (ref 24–184)
Protein/Creatinine Ratio: 0.064 mg/mg creat (ref 0.024–0.184)
Total Protein, Urine: 12 mg/dL (ref 5–24)

## 2022-07-07 ENCOUNTER — Telehealth: Payer: Self-pay | Admitting: Family Medicine

## 2022-07-07 ENCOUNTER — Other Ambulatory Visit: Payer: Self-pay

## 2022-07-07 ENCOUNTER — Other Ambulatory Visit: Payer: Self-pay | Admitting: Family Medicine

## 2022-07-07 NOTE — Telephone Encounter (Signed)
Patient's husband Jorja Loa came to the office to give provider a list of the fast acting insulin covered by the patient's insurance [facility]).  They are as follows:  - Lispro - Lispro Quick pen - Afrezza - Hemalog quick pen - Lyumjev  Patient also needs a refill of Accu-Chek barrel lancets ((6 pricks per lancet). Pharmacy needs a new script (one on file has expired).  Pharmacy confirmed as:  [facility] [address] [place] - [address], [place] [address] Phone: [phone]  Fax: [phone]   Please advise when refill sent in at [phone]

## 2022-07-08 ENCOUNTER — Other Ambulatory Visit: Payer: Self-pay

## 2022-07-20 ENCOUNTER — Encounter (INDEPENDENT_AMBULATORY_CARE_PROVIDER_SITE_OTHER): Payer: 59 | Admitting: Ophthalmology

## 2022-08-10 ENCOUNTER — Telehealth: Payer: Self-pay

## 2022-08-10 NOTE — Telephone Encounter (Signed)
Fasting Suguar numbers per pt for wk 4/22-28  4/22  Fasting: 110 Took 30units slow acting insulin  3units before breakfast 2hrs after break. 89 4units before lunch

## 2022-09-27 ENCOUNTER — Other Ambulatory Visit: Payer: Self-pay | Admitting: Family Medicine

## 2022-09-29 ENCOUNTER — Other Ambulatory Visit: Payer: 59

## 2022-09-29 DIAGNOSIS — E119 Type 2 diabetes mellitus without complications: Secondary | ICD-10-CM

## 2022-09-30 LAB — HEPATIC FUNCTION PANEL
AG Ratio: 1.6 (calc) (ref 1.0–2.5)
ALT: 20 U/L (ref 6–29)
AST: 17 U/L (ref 10–35)
Albumin: 4.4 g/dL (ref 3.6–5.1)
Alkaline phosphatase (APISO): 83 U/L (ref 31–125)
Bilirubin, Direct: 0 mg/dL (ref 0.0–0.2)
Globulin: 2.7 g/dL (calc) (ref 1.9–3.7)
Indirect Bilirubin: 0.3 mg/dL (calc) (ref 0.2–1.2)
Total Bilirubin: 0.3 mg/dL (ref 0.2–1.2)
Total Protein: 7.1 g/dL (ref 6.1–8.1)

## 2022-10-12 ENCOUNTER — Other Ambulatory Visit: Payer: Self-pay | Admitting: Family Medicine

## 2022-10-12 NOTE — Telephone Encounter (Signed)
Requested Prescriptions  Pending Prescriptions Disp Refills   losartan (COZAAR) 100 MG tablet [Pharmacy Med Name: Losartan Potassium 100 MG Oral Tablet] 90 tablet 0    Sig: Take 1 tablet by mouth once daily     Cardiovascular:  Angiotensin Receptor Blockers Failed - 10/12/2022  6:56 AM      Failed - Valid encounter within last 6 months    Recent Outpatient Visits           1 year ago Proliferative diabetic retinopathy associated with type 1 diabetes mellitus, unspecified laterality, unspecified proliferative retinopathy type (HCC)   [facility] Pickard, Priscille Heidelberg, MD   2 years ago Diabetes mellitus type 1, controlled, insulin dependent (HCC)   [facility] Pickard, Priscille Heidelberg, MD   3 years ago Diabetes mellitus type 1, controlled, insulin dependent (HCC)   [facility] Pickard, Priscille Heidelberg, MD   4 years ago Diabetes mellitus type 1, controlled, insulin dependent (HCC)   [facility] Pickard, Priscille Heidelberg, MD   5 years ago Controlled diabetes mellitus type 1 with complications [facility])   [facility] Pickard, Priscille Heidelberg, MD              Passed - Cr in normal range and within 180 days    Creat  Date Value Ref Range Status  06/22/2022 0.75 0.50 - 0.99 mg/dL Final   Creatinine, Urine  Date Value Ref Range Status  06/22/2022 187 20 - 275 mg/dL Final         Passed - K in normal range and within 180 days    Potassium  Date Value Ref Range Status  06/22/2022 4.2 3.5 - 5.3 mmol/L Final         Passed - Patient is not pregnant      Passed - Last BP in normal range    BP Readings from Last 1 Encounters:  06/22/22 118/72

## 2022-10-24 ENCOUNTER — Other Ambulatory Visit: Payer: Self-pay | Admitting: Family Medicine

## 2022-10-26 NOTE — Telephone Encounter (Signed)
Requested medication (s) are due for refill today:   Yes  Requested medication (s) are on the active medication list:   Yes  Future visit scheduled:   No   LOV 06/22/2022   Last ordered: 09/28/2022 #30, 0 refills  Returned because no protocol assigned to this medication   Requested Prescriptions  Pending Prescriptions Disp Refills   terbinafine (LAMISIL) 250 MG tablet [Pharmacy Med Name: Terbinafine HCl 250 MG Oral Tablet] 30 tablet 0    Sig: Take 1 tablet by mouth once daily     Off-Protocol Failed - 10/24/2022  6:55 AM      Failed - Medication not assigned to a protocol, review manually.      Failed - Valid encounter within last 12 months    Recent Outpatient Visits           1 year ago Proliferative diabetic retinopathy associated with type 1 diabetes mellitus, unspecified laterality, unspecified proliferative retinopathy type (HCC)   [facility] Donita Brooks, MD   2 years ago Diabetes mellitus type 1, controlled, insulin dependent (HCC)   [facility] Medicine Donita Brooks, MD   3 years ago Diabetes mellitus type 1, controlled, insulin dependent (HCC)   [facility] Medicine Donita Brooks, MD   4 years ago Diabetes mellitus type 1, controlled, insulin dependent (HCC)   [facility] Medicine Pickard, Priscille Heidelberg, MD   5 years ago Controlled diabetes mellitus type 1 with complications (HCC)   [facility] Medicine Pickard, Priscille Heidelberg, MD

## 2022-10-30 ENCOUNTER — Other Ambulatory Visit: Payer: Self-pay | Admitting: Family Medicine

## 2022-10-30 NOTE — Telephone Encounter (Signed)
Requested medication (s) are due for refill today: Yes  Requested medication (s) are on the active medication list: Yes  Last refill:  09/28/22  Future visit scheduled: No  Notes to clinic:  Unable to refill per protocol, medication not assigned to the refill protocol.      Requested Prescriptions  Pending Prescriptions Disp Refills   terbinafine (LAMISIL) 250 MG tablet [Pharmacy Med Name: Terbinafine HCl 250 MG Oral Tablet] 30 tablet 0    Sig: Take 1 tablet by mouth once daily     Off-Protocol Failed - 10/30/2022 11:33 AM      Failed - Medication not assigned to a protocol, review manually.      Failed - Valid encounter within last 12 months    Recent Outpatient Visits           1 year ago Proliferative diabetic retinopathy associated with type 1 diabetes mellitus, unspecified laterality, unspecified proliferative retinopathy type (HCC)   [facility] Pickard, Priscille Heidelberg, MD   2 years ago Diabetes mellitus type 1, controlled, insulin dependent (HCC)   [facility] Medicine Pickard, Priscille Heidelberg, MD   3 years ago Diabetes mellitus type 1, controlled, insulin dependent (HCC)   [facility] Medicine Pickard, Priscille Heidelberg, MD   4 years ago Diabetes mellitus type 1, controlled, insulin dependent (HCC)   [facility] Medicine Pickard, Priscille Heidelberg, MD   5 years ago Controlled diabetes mellitus type 1 with complications (HCC)   [facility] Medicine Pickard, Priscille Heidelberg, MD              Signed Prescriptions Disp Refills   carvedilol (COREG) 25 MG tablet 90 tablet 0    Sig: TAKE 1 TABLET BY MOUTH ONCE DAILY AT 6 AM     Cardiovascular: Beta Blockers 3 Failed - 10/30/2022 11:33 AM      Failed - Valid encounter within last 6 months    Recent Outpatient Visits           1 year ago Proliferative diabetic retinopathy associated with type 1 diabetes mellitus, unspecified laterality, unspecified proliferative retinopathy type (HCC)   [facility]  Medicine Pickard, Priscille Heidelberg, MD   2 years ago Diabetes mellitus type 1, controlled, insulin dependent (HCC)   [facility] Medicine Pickard, Priscille Heidelberg, MD   3 years ago Diabetes mellitus type 1, controlled, insulin dependent (HCC)   [facility] Medicine Pickard, Priscille Heidelberg, MD   4 years ago Diabetes mellitus type 1, controlled, insulin dependent (HCC)   [facility] Medicine Pickard, Priscille Heidelberg, MD   5 years ago Controlled diabetes mellitus type 1 with complications (HCC)   [facility] Medicine Pickard, Priscille Heidelberg, MD              Passed - Cr in normal range and within 360 days    Creat  Date Value Ref Range Status  06/22/2022 0.75 0.50 - 0.99 mg/dL Final   Creatinine, Urine  Date Value Ref Range Status  06/22/2022 187 20 - 275 mg/dL Final         Passed - AST in normal range and within 360 days    AST  Date Value Ref Range Status  09/29/2022 17 10 - 35 U/L Final         Passed - ALT in normal range and within 360 days    ALT  Date Value Ref Range Status  09/29/2022 20 6 - 29 U/L Final  Passed - Last BP in normal range    BP Readings from Last 1 Encounters:  06/22/22 118/72         Passed - Last Heart Rate in normal range    Pulse Readings from Last 1 Encounters:  06/22/22 (!) 107

## 2022-10-30 NOTE — Telephone Encounter (Signed)
Last OV 06/22/22 Requested Prescriptions  Pending Prescriptions Disp Refills   terbinafine (LAMISIL) 250 MG tablet [Pharmacy Med Name: Terbinafine HCl 250 MG Oral Tablet] 30 tablet 0    Sig: Take 1 tablet by mouth once daily     Off-Protocol Failed - 10/30/2022 11:33 AM      Failed - Medication not assigned to a protocol, review manually.      Failed - Valid encounter within last 12 months    Recent Outpatient Visits           1 year ago Proliferative diabetic retinopathy associated with type 1 diabetes mellitus, unspecified laterality, unspecified proliferative retinopathy type (HCC)   [facility] Medicine Pickard, Priscille Heidelberg, MD   2 years ago Diabetes mellitus type 1, controlled, insulin dependent (HCC)   [facility] Family Medicine Pickard, Priscille Heidelberg, MD   3 years ago Diabetes mellitus type 1, controlled, insulin dependent (HCC)   [facility] Family Medicine Pickard, Priscille Heidelberg, MD   4 years ago Diabetes mellitus type 1, controlled, insulin dependent (HCC)   [facility] Medicine Pickard, Priscille Heidelberg, MD   5 years ago Controlled diabetes mellitus type 1 with complications (HCC)   [facility] Pickard, Priscille Heidelberg, MD               carvedilol (COREG) 25 MG tablet [Pharmacy Med Name: Carvedilol 25 MG Oral Tablet] 90 tablet 0    Sig: TAKE 1 TABLET BY MOUTH ONCE DAILY AT 6 AM     Cardiovascular: Beta Blockers 3 Failed - 10/30/2022 11:33 AM      Failed - Valid encounter within last 6 months    Recent Outpatient Visits           1 year ago Proliferative diabetic retinopathy associated with type 1 diabetes mellitus, unspecified laterality, unspecified proliferative retinopathy type (HCC)   [facility] Medicine Pickard, Priscille Heidelberg, MD   2 years ago Diabetes mellitus type 1, controlled, insulin dependent (HCC)   [facility] Medicine Pickard, Priscille Heidelberg, MD   3 years ago Diabetes mellitus type 1, controlled, insulin dependent (HCC)   [facility] Pickard, Priscille Heidelberg, MD   4 years ago Diabetes mellitus type 1, controlled, insulin dependent (HCC)   [facility] Medicine Pickard, Priscille Heidelberg, MD   5 years ago Controlled diabetes mellitus type 1 with complications (HCC)   [facility] Medicine Pickard, Priscille Heidelberg, MD              Passed - Cr in normal range and within 360 days    Creat  Date Value Ref Range Status  06/22/2022 0.75 0.50 - 0.99 mg/dL Final   Creatinine, Urine  Date Value Ref Range Status  06/22/2022 187 20 - 275 mg/dL Final         Passed - AST in normal range and within 360 days    AST  Date Value Ref Range Status  09/29/2022 17 10 - 35 U/L Final         Passed - ALT in normal range and within 360 days    ALT  Date Value Ref Range Status  09/29/2022 20 6 - 29 U/L Final         Passed - Last BP in normal range    BP Readings from Last 1 Encounters:  06/22/22 118/72         Passed - Last Heart Rate in normal range  Pulse Readings from Last 1 Encounters:  06/22/22 (!) 107

## 2022-11-09 ENCOUNTER — Other Ambulatory Visit: Payer: Self-pay | Admitting: Family Medicine

## 2022-11-09 NOTE — Telephone Encounter (Signed)
Prescription Request  11/09/2022  LOV: 06/22/2022  What is the name of the medication or equipment? terbinafine (LAMISIL) 250 MG tablet   Have you contacted your pharmacy to request a refill? Yes   Which pharmacy would you like this sent to?  [facility] [address] [place] - [address] [place] [address] Phone: [phone] Fax: [phone]    Patient notified that their request is being sent to the clinical staff for review and that they should receive a response within 2 business days.   Please advise at [facility] [phone]

## 2022-11-11 NOTE — Telephone Encounter (Signed)
Requested medication (s) are due for refill today: yes  Requested medication (s) are on the active medication list: yes  Last refill:  09/28/22  Future visit scheduled: no  Notes to clinic:  Medication not assigned to a protocol, review manually.      Requested Prescriptions  Pending Prescriptions Disp Refills   terbinafine (LAMISIL) 250 MG tablet 30 tablet 0    Sig: Take 1 tablet (250 mg total) by mouth daily.     Off-Protocol Failed - 11/09/2022  2:34 PM      Failed - Medication not assigned to a protocol, review manually.      Failed - Valid encounter within last 12 months    Recent Outpatient Visits           1 year ago Proliferative diabetic retinopathy associated with type 1 diabetes mellitus, unspecified laterality, unspecified proliferative retinopathy type (HCC)   [facility] Donita Brooks, MD   2 years ago Diabetes mellitus type 1, controlled, insulin dependent (HCC)   [facility] Medicine Donita Brooks, MD   3 years ago Diabetes mellitus type 1, controlled, insulin dependent (HCC)   [facility] Medicine Donita Brooks, MD   4 years ago Diabetes mellitus type 1, controlled, insulin dependent (HCC)   [facility] Medicine Pickard, Priscille Heidelberg, MD   5 years ago Controlled diabetes mellitus type 1 with complications (HCC)   [facility] Medicine Pickard, Priscille Heidelberg, MD

## 2022-11-21 ENCOUNTER — Other Ambulatory Visit: Payer: Self-pay | Admitting: Family Medicine

## 2022-11-24 NOTE — Telephone Encounter (Signed)
Patient will need an office visit for further refills. Requested Prescriptions  Pending Prescriptions Disp Refills   LANTUS SOLOSTAR 100 UNIT/ML Solostar Pen [Pharmacy Med Name: Lantus SoloStar 100 UNIT/ML Subcutaneous Solution Pen-injector] 30 mL 0    Sig: INJECT 35 UNITS SUBQ ONCE DAILY.   INCREASE BY 1 UNIT EACH DAY UNTIL FASTING BLOOD GLUCOSE IS LESS THAN 130. THEN HOLD AT THAT DOSE     Endocrinology:  Diabetes - Insulins Failed - 11/21/2022  2:17 PM      Failed - HBA1C is between 0 and 7.9 and within 180 days    Hgb A1c MFr Bld  Date Value Ref Range Status  06/22/2022 8.6 (H) <5.7 % of total Hgb Final    Comment:    For someone without known diabetes, a hemoglobin A1c value of 6.5% or greater indicates that they may have  diabetes and this should be confirmed with a follow-up  test. . For someone with known diabetes, a value <7% indicates  that their diabetes is well controlled and a value  greater than or equal to 7% indicates suboptimal  control. A1c targets should be individualized based on  duration of diabetes, age, comorbid conditions, and  other considerations. . Currently, no consensus exists regarding use of hemoglobin A1c for diagnosis of diabetes for children. .          Failed - Valid encounter within last 6 months    Recent Outpatient Visits           1 year ago Proliferative diabetic retinopathy associated with type 1 diabetes mellitus, unspecified laterality, unspecified proliferative retinopathy type (HCC)   [facility] Donita Brooks, MD   2 years ago Diabetes mellitus type 1, controlled, insulin dependent (HCC)   [facility] Medicine Donita Brooks, MD   3 years ago Diabetes mellitus type 1, controlled, insulin dependent (HCC)   [facility] Medicine Donita Brooks, MD   4 years ago Diabetes mellitus type 1, controlled, insulin dependent (HCC)   [facility] Medicine Pickard, Priscille Heidelberg, MD   5 years ago  Controlled diabetes mellitus type 1 with complications (HCC)   [facility] Medicine Pickard, Priscille Heidelberg, MD

## 2022-12-30 ENCOUNTER — Other Ambulatory Visit: Payer: Self-pay

## 2022-12-30 ENCOUNTER — Telehealth: Payer: Self-pay | Admitting: Family Medicine

## 2022-12-30 DIAGNOSIS — E109 Type 1 diabetes mellitus without complications: Secondary | ICD-10-CM

## 2022-12-30 MED ORDER — INSULIN LISPRO 100 UNIT/ML IJ SOLN: INTRAMUSCULAR | 1 refills | Status: DC

## 2022-12-30 NOTE — Telephone Encounter (Signed)
Received call from [place] at [facility] pharmacy to request a new script for generic form of HUMALOG 100 UNIT/ML injection   Ok to sent script as Humalog as long as you don't write "DAW" on it.  Pharmacy:   [facility] [address], [place] - [address], [place] [address] Phone: [phone]  Fax: [phone]   Please advise pharmacist.

## 2023-01-05 ENCOUNTER — Encounter: Payer: Self-pay | Admitting: Family Medicine

## 2023-01-05 ENCOUNTER — Other Ambulatory Visit: Payer: Self-pay | Admitting: Family Medicine

## 2023-01-05 ENCOUNTER — Ambulatory Visit (INDEPENDENT_AMBULATORY_CARE_PROVIDER_SITE_OTHER): Payer: 59 | Admitting: Family Medicine

## 2023-01-05 VITALS — BP 116/62 | HR 97 | Temp 97.9°F | Ht 63.0 in | Wt 178.0 lb

## 2023-01-05 DIAGNOSIS — E103599 Type 1 diabetes mellitus with proliferative diabetic retinopathy without macular edema, unspecified eye: Secondary | ICD-10-CM

## 2023-01-05 DIAGNOSIS — F17200 Nicotine dependence, unspecified, uncomplicated: Secondary | ICD-10-CM

## 2023-01-05 DIAGNOSIS — E109 Type 1 diabetes mellitus without complications: Secondary | ICD-10-CM

## 2023-01-05 DIAGNOSIS — I10 Essential (primary) hypertension: Secondary | ICD-10-CM

## 2023-01-05 MED ORDER — DEXCOM G7 SENSOR MISC: 1.0000 | 11 refills | Status: DC

## 2023-01-05 MED ORDER — INSULIN GLARGINE 100 UNIT/ML ~~LOC~~ SOLN: 35.0000 [IU] | Freq: Every day | SUBCUTANEOUS | 3 refills | Status: DC

## 2023-01-05 MED ORDER — REPATHA SURECLICK 140 MG/ML ~~LOC~~ SOAJ: 140.0000 mg | SUBCUTANEOUS | 2 refills | Status: DC

## 2023-01-05 MED ORDER — FLUCONAZOLE 150 MG PO TABS: 150.0000 mg | ORAL_TABLET | Freq: Once | ORAL | 0 refills | Status: AC

## 2023-01-05 MED ORDER — INSULIN LISPRO 100 UNIT/ML IJ SOLN: INTRAMUSCULAR | 3 refills | Status: AC

## 2023-01-05 MED ORDER — DEXCOM G7 RECEIVER DEVI: 1.0000 | Freq: Once | 1 refills | Status: AC

## 2023-01-05 NOTE — Progress Notes (Signed)
Subjective:    Patient ID: [name], female    DOB: January 07, 1976, 47 y.o.   MRN: [id number] Patient continues to smoke.  She states that recently she was unable to get her rapid acting insulin.  As result her sugars were out of control between 200-300.  She has been taking her long-acting insulin 35 units a day.  She denies any hypoglycemic episodes that she denies any angina.  She denies any dyspnea on exertion.  She does have a history of coronary artery calcifications on CT scan in 2017.  She is unable to tolerate statins due to myalgias.  She is unable to tolerate Zetia due to myalgias.  However her LDL cholesterol remains greater than 160 with known coronary artery disease.  Past Medical History:  Diagnosis Date   Asthma    Carpal tunnel syndrome on left    Complication of anesthesia 07/2014   Mouth was swollen on inside, Front tooth was chipped   Diabetes mellitus    19 years ago   Diabetic neuropathy (HCC)    Diabetic retinopathy (HCC)    Essential hypertension 12/23/2015   GERD (gastroesophageal reflux disease)    Headache    Migraine- a long time ago   Hypercholesteremia    Hyperlipidemia 12/23/2015   Hypertension    Muscle spasms of lower extremity    legs and weakness   Pneumonia    Restless leg    Statin myopathy    Type 1 diabetes mellitus (HCC)    Vitreous hemorrhage (HCC) 08/08/2014   Vitreous hemorrhage of right eye (HCC) 08/28/2014   Vitreous hemorrhage, left eye (HCC)    Past Surgical History:  Procedure Laterality Date   CARDIAC CATHETERIZATION N/A 12/27/2015   Procedure: Left Heart Cath and Coronary Angiography;  Surgeon: Lennette Bihari, MD;  [facility]: [facility];  Service: Cardiovascular;  Laterality: N/A;   CESAREAN SECTION  1993, 1998, 2002   COLONOSCOPY WITH PROPOFOL N/A 06/16/2021   Procedure: COLONOSCOPY WITH PROPOFOL;  Surgeon: Lanelle Bal, DO;  Location: [facility];  Service: Endoscopy;  Laterality: N/A;  12:45 / ASA 2   DILATION  AND CURETTAGE OF UTERUS  1997   GAS/FLUID EXCHANGE Right 08/28/2014   Procedure: GAS/FLUID EXCHANGE;  Surgeon: Sherrie George, MD;  Location: [facility] OR;  Service: Ophthalmology;  Laterality: Right;   LASER PHOTO ABLATION     left x 2, right x 1   LASER PHOTO ABLATION Right 08/28/2014   Procedure: LASER PHOTO ABLATION;  Surgeon: Sherrie George, MD;  Location: [facility] OR;  Service: Ophthalmology;  Laterality: Right;   MEMBRANE PEEL Right 08/28/2014   Procedure: MEMBRANE PEEL;  Surgeon: Sherrie George, MD;  Location: [facility] OR;  Service: Ophthalmology;  Laterality: Right;   PARS PLANA VITRECTOMY Right 08/28/2014   Procedure: PARS PLANA VITRECTOMY WITH 25 GAUGE;  Surgeon: Sherrie George, MD;  Location: [facility] OR;  Service: Ophthalmology;  Laterality: Right;   PARS PLANA VITRECTOMY Left 12/24/2014   Procedure: LEFT PARS PLANA VITRECTOMY WITH 25 GAUGE WITH ENDOLASER ;  Surgeon: Edmon Crape, MD;  Location: [facility] OR;  Service: Ophthalmology;  Laterality: Left;   POLYPECTOMY  06/16/2021   Procedure: POLYPECTOMY;  Surgeon: Lanelle Bal, DO;  [facility];  Service: Endoscopy;;   TUBAL LIGATION  2002   WISDOM TOOTH EXTRACTION     age 37   Current Outpatient Medications on File Prior to Visit  Medication Sig Dispense Refill   albuterol (PROAIR  HFA) 108 (90 Base) MCG/ACT inhaler 2 PUFFS EVERY 6 HOURS AS NEEDED for shortness of breath 3 each 3   albuterol (PROVENTIL) (2.5 MG/3ML) 0.083% nebulizer solution Take 3 mLs (2.5 mg total) by nebulization every 4 (four) hours as needed for wheezing or shortness of breath. 150 mL 1   benzonatate (TESSALON) 100 MG capsule Take 1 capsule (100 mg total) by mouth 3 (three) times daily as needed for cough. Do not take with alcohol or while driving or operating heavy machinery.  May cause drowsiness. 30 capsule 0   brimonidine (ALPHAGAN) 0.2 % ophthalmic solution 1 drop 2 (two) times daily.     carvedilol (COREG) 25 MG tablet TAKE 1 TABLET BY MOUTH ONCE DAILY AT 6 AM 90  tablet 0   ezetimibe (ZETIA) 10 MG tablet Take 1 tablet (10 mg total) by mouth daily. 90 tablet 3   Fluticasone Furoate 100 MCG/ACT AEPB Inhale 2 puffs into the lungs 2 (two) times daily. 30 each 11   Glucose Blood (BLOOD GLUCOSE TEST STRIPS 333) STRP 1 strip by In Vitro route in the morning, at noon, and at bedtime. 300 strip 3   insulin lispro (HUMALOG) 100 UNIT/ML injection INJECT 3 TO 8 UNITS SUBCUTANEOUSLY THREE TIMES DAILY WITH MEALS 10 mL 1   LANTUS SOLOSTAR 100 UNIT/ML Solostar Pen INJECT 35 UNITS SUBQ ONCE DAILY.   INCREASE BY 1 UNIT EACH DAY UNTIL FASTING BLOOD GLUCOSE IS LESS THAN 130. THEN HOLD AT THAT DOSE 30 mL 0   losartan (COZAAR) 100 MG tablet Take 1 tablet by mouth once daily 90 tablet 0   montelukast (SINGULAIR) 10 MG tablet Take 1 tablet (10 mg total) by mouth at bedtime. 30 tablet 11   OVER THE COUNTER MEDICATION Take 2 tablets by mouth daily as needed (restless legs). Restless leg relief     promethazine-dextromethorphan (PROMETHAZINE-DM) 6.25-15 MG/5ML syrup Take 5 mLs by mouth at bedtime as needed. Do not take with alcohol or while driving or operating heavy machinery.  May cause drowsiness. 100 mL 0   SEMGLEE 100 UNIT/ML injection INJECT 35 UNITS SUBCUTANEOUSLY ONCE DAILY 10 mL 11   terbinafine (LAMISIL) 250 MG tablet Take 1 tablet by mouth once daily 30 tablet 0   No current facility-administered medications on file prior to visit.   Allergies  Allergen Reactions   Fish-Derived Products Hives and Shortness Of Breath   Demerol Itching and Rash   Morphine And Codeine Itching and Rash        Zocor [Simvastatin] Itching and Other (See Comments)    Leg pain   Social History   Socioeconomic History   Marital status: Married    Spouse name: Not on file   Number of children: Not on file   Years of education: Not on file   Highest education level: Not on file  Occupational History   Not on file  Tobacco Use   Smoking status: Every Day    Current packs/day: 2.00     Average packs/day: 2.0 packs/day for 9.0 years (18.0 ttl pk-yrs)    Types: Cigarettes   Smokeless tobacco: Never  Substance and Sexual Activity   Alcohol use: No   Drug use: No   Sexual activity: Yes    Partners: Male    Birth control/protection: None, Surgical    Comment: tubal   Other Topics Concern   Not on file  Social History Narrative   Married, lives with spouse and 2 kids   Occupation: Building surveyor  Social Determinants of Health   Financial Resource Strain: Not on file  Food Insecurity: Not on file  Transportation Needs: Not on file  Physical Activity: Not on file  Stress: Not on file  Social Connections: Not on file  Intimate Partner Violence: Not on file     Review of Systems  All other systems reviewed and are negative.      Objective:   Physical Exam Vitals reviewed.  Constitutional:      Appearance: She is well-developed.  Neck:     Thyroid: No thyromegaly.  Cardiovascular:     Rate and Rhythm: Normal rate and regular rhythm.     Heart sounds: Normal heart sounds. No murmur heard. Pulmonary:     Effort: Pulmonary effort is normal. No respiratory distress.     Breath sounds: No decreased breath sounds, wheezing, rhonchi or rales.  Abdominal:     General: Bowel sounds are normal. There is no distension.     Palpations: Abdomen is soft.     Tenderness: There is no abdominal tenderness. There is no guarding or rebound.  Musculoskeletal:     Cervical back: Neck supple.  Lymphadenopathy:     Cervical: No cervical adenopathy.           Assessment & Plan:  Diabetes mellitus type 1, controlled, insulin dependent (HCC) - Plan: Hemoglobin A1c, COMPLETE METABOLIC PANEL WITH GFR, Lipid panel, Protein / Creatinine Ratio, Urine, insulin lispro (HUMALOG) 100 UNIT/ML injection, CT CARDIAC SCORING (SELF PAY ONLY)  Proliferative diabetic retinopathy associated with type 1 diabetes mellitus, unspecified laterality, unspecified proliferative retinopathy  type (HCC)  Tobacco use disorder  Essential hypertension Strongly recommended smoking cessation.  Obtain a coronary artery calcium score to determine the extent and severity of the coronary artery disease.  If significant, would recommend trying Repatha 140 mg subcu every 2 weeks to lower cholesterol less than 100.  Check an A1c.  Recommended continuous blood glucose monitoring as I believe that this would help the patient safely uptitrate her rapid acting insulin with meals to achieve better glycemic control.  Offer the patient a flu shot but she declined

## 2023-01-05 NOTE — Telephone Encounter (Signed)
Requested Prescriptions  Pending Prescriptions Disp Refills   losartan (COZAAR) 100 MG tablet [Pharmacy Med Name: Losartan Potassium 100 MG Oral Tablet] 90 tablet 0    Sig: Take 1 tablet by mouth once daily     Cardiovascular:  Angiotensin Receptor Blockers Failed - 01/05/2023  6:57 AM      Failed - Cr in normal range and within 180 days    Creat  Date Value Ref Range Status  06/22/2022 0.75 0.50 - 0.99 mg/dL Final   Creatinine, Urine  Date Value Ref Range Status  06/22/2022 187 20 - 275 mg/dL Final         Failed - K in normal range and within 180 days    Potassium  Date Value Ref Range Status  06/22/2022 4.2 3.5 - 5.3 mmol/L Final         Failed - Valid encounter within last 6 months    Recent Outpatient Visits           1 year ago Proliferative diabetic retinopathy associated with type 1 diabetes mellitus, unspecified laterality, unspecified proliferative retinopathy type (HCC)   [facility] Donita Brooks, MD   2 years ago Diabetes mellitus type 1, controlled, insulin dependent (HCC)   [facility] Donita Brooks, MD   3 years ago Diabetes mellitus type 1, controlled, insulin dependent (HCC)   [facility] Medicine Pickard, Priscille Heidelberg, MD   4 years ago Diabetes mellitus type 1, controlled, insulin dependent (HCC)   [facility] Medicine Pickard, Priscille Heidelberg, MD   5 years ago Controlled diabetes mellitus type 1 with complications (HCC)   [facility] Pickard, Priscille Heidelberg, MD       Future Appointments             In 3 months Pickard, Priscille Heidelberg, MD [facility], [facility]            Passed - Patient is not pregnant      Passed - Last BP in normal range    BP Readings from Last 1 Encounters:  01/05/23 116/62

## 2023-01-06 LAB — LIPID PANEL
Cholesterol: 230 mg/dL — ABNORMAL HIGH (ref <200)
HDL: 32 mg/dL — ABNORMAL LOW (ref >=50)
LDL Cholesterol (Calc): 168 mg/dL — ABNORMAL HIGH
Non-HDL Cholesterol (Calc): 198 mg/dL — ABNORMAL HIGH (ref <130)
Total CHOL/HDL Ratio: 7.2 (calc) — ABNORMAL HIGH (ref <5.0)
Triglycerides: 152 mg/dL — ABNORMAL HIGH (ref <150)

## 2023-01-06 LAB — COMPLETE METABOLIC PANEL WITH GFR
AG Ratio: 1.6 (calc) (ref 1.0–2.5)
ALT: 22 U/L (ref 6–29)
AST: 17 U/L (ref 10–35)
Albumin: 4.4 g/dL (ref 3.6–5.1)
Alkaline phosphatase (APISO): 77 U/L (ref 31–125)
BUN: 20 mg/dL (ref 7–25)
CO2: 29 mmol/L (ref 20–32)
Calcium: 9.6 mg/dL (ref 8.6–10.2)
Chloride: 104 mmol/L (ref 98–110)
Creat: 0.74 mg/dL (ref 0.50–0.99)
Globulin: 2.8 g/dL (ref 1.9–3.7)
Glucose, Bld: 222 mg/dL — ABNORMAL HIGH (ref 65–99)
Potassium: 4.4 mmol/L (ref 3.5–5.3)
Sodium: 140 mmol/L (ref 135–146)
Total Bilirubin: 0.4 mg/dL (ref 0.2–1.2)
Total Protein: 7.2 g/dL (ref 6.1–8.1)
eGFR: 99 mL/min/{1.73_m2} (ref >=60)

## 2023-01-06 LAB — HEMOGLOBIN A1C
Hgb A1c MFr Bld: 9.3 %{Hb} — ABNORMAL HIGH (ref <5.7)
Mean Plasma Glucose: 220 mg/dL
eAG (mmol/L): 12.2 mmol/L

## 2023-01-06 LAB — PROTEIN / CREATININE RATIO, URINE
Creatinine, Urine: 77 mg/dL (ref 20–275)
Protein/Creat Ratio: 91 mg/g{creat} (ref 24–184)
Protein/Creatinine Ratio: 0.091 mg/mg{creat} (ref 0.024–0.184)
Total Protein, Urine: 7 mg/dL (ref 5–24)

## 2023-01-16 ENCOUNTER — Other Ambulatory Visit: Payer: Self-pay | Admitting: Family Medicine

## 2023-01-18 NOTE — Telephone Encounter (Signed)
Requested Prescriptions  Pending Prescriptions Disp Refills   carvedilol (COREG) 25 MG tablet [Pharmacy Med Name: Carvedilol 25 MG Oral Tablet] 90 tablet 0    Sig: TAKE 1 TABLET BY MOUTH ONCE DAILY AT 6 AM     Cardiovascular: Beta Blockers 3 Failed - 01/16/2023  6:54 AM      Failed - Valid encounter within last 6 months    Recent Outpatient Visits           1 year ago Proliferative diabetic retinopathy associated with type 1 diabetes mellitus, unspecified laterality, unspecified proliferative retinopathy type (HCC)   [facility] Pickard, Priscille Heidelberg, MD   2 years ago Diabetes mellitus type 1, controlled, insulin dependent (HCC)   [facility] Pickard, Priscille Heidelberg, MD   3 years ago Diabetes mellitus type 1, controlled, insulin dependent (HCC)   [facility] Pickard, Priscille Heidelberg, MD   5 years ago Diabetes mellitus type 1, controlled, insulin dependent (HCC)   [facility] Pickard, Priscille Heidelberg, MD   5 years ago Controlled diabetes mellitus type 1 with complications (HCC)   [facility] Pickard, Priscille Heidelberg, MD       Future Appointments             In 2 months Pickard, Priscille Heidelberg, MD [facility]            Passed - Cr in normal range and within 360 days    Creat  Date Value Ref Range Status  01/05/2023 0.74 0.50 - 0.99 mg/dL Final   Creatinine, Urine  Date Value Ref Range Status  01/05/2023 77 20 - 275 mg/dL Final         Passed - AST in normal range and within 360 days    AST  Date Value Ref Range Status  01/05/2023 17 10 - 35 U/L Final         Passed - ALT in normal range and within 360 days    ALT  Date Value Ref Range Status  01/05/2023 22 6 - 29 U/L Final         Passed - Last BP in normal range    BP Readings from Last 1 Encounters:  01/05/23 116/62         Passed - Last Heart Rate in normal range    Pulse Readings from Last 1 Encounters:  01/05/23 97

## 2023-01-21 ENCOUNTER — Telehealth: Payer: Self-pay

## 2023-01-21 NOTE — Telephone Encounter (Signed)
Pt's husband called in to ask if PA could be sent in for pt's Continuous Glucose Sensor (DEXCOM G7 SENSOR) MISC [[id number]]. Please advise  Cb#: [phone]

## 2023-04-04 ENCOUNTER — Other Ambulatory Visit: Payer: Self-pay | Admitting: Family Medicine

## 2023-04-08 ENCOUNTER — Ambulatory Visit (INDEPENDENT_AMBULATORY_CARE_PROVIDER_SITE_OTHER): Payer: 59 | Admitting: Family Medicine

## 2023-04-08 ENCOUNTER — Other Ambulatory Visit: Payer: Self-pay | Admitting: Family Medicine

## 2023-04-08 ENCOUNTER — Encounter: Payer: Self-pay | Admitting: Family Medicine

## 2023-04-08 VITALS — BP 120/68 | HR 103 | Temp 98.4°F | Ht 63.0 in | Wt 179.0 lb

## 2023-04-08 DIAGNOSIS — I2510 Atherosclerotic heart disease of native coronary artery without angina pectoris: Secondary | ICD-10-CM

## 2023-04-08 DIAGNOSIS — F17200 Nicotine dependence, unspecified, uncomplicated: Secondary | ICD-10-CM | POA: Diagnosis not present

## 2023-04-08 DIAGNOSIS — I10 Essential (primary) hypertension: Secondary | ICD-10-CM | POA: Diagnosis not present

## 2023-04-08 DIAGNOSIS — E7800 Pure hypercholesterolemia, unspecified: Secondary | ICD-10-CM | POA: Diagnosis not present

## 2023-04-08 DIAGNOSIS — E109 Type 1 diabetes mellitus without complications: Secondary | ICD-10-CM

## 2023-04-08 MED ORDER — LEQVIO 284 MG/1.5ML ~~LOC~~ SOSY: 284.0000 mg | PREFILLED_SYRINGE | Freq: Once | SUBCUTANEOUS | 0 refills | Status: DC

## 2023-04-08 NOTE — Progress Notes (Signed)
 Subjective:    Patient ID: [name], female    DOB: 09-07-1975, 48 y.o.   MRN: [id number] P patient is here today for checkup.  Her last hemoglobin A1c was greater than 9.  At that point we started continuous blood glucose monitoring.  She is currently using 45 units of Lantus  in the morning, 12 units of rapid acting insulin  with breakfast, 8 units of rapid acting insulin  with lunch, but she frequently forgets to take rapid acting insulin  with dinner time.  Her 2-hour postprandial sugars after breakfast are typically under 120.  She often is under 120 around 3:00 in the afternoon.  She tends to be closer to 200 or even over 200 later in the day after dinnertime.  She also is still smoking.  Her blood pressure today is excellent.  Unfortunately she has known coronary artery disease.  She is unable to tolerate any statin.  She has tried and failed several statins including Zocor and Lipitor Crestor .  She also states that she cannot take Zetia .  Her insurance would not cover Repatha .  She has documented coronary atherosclerosis on CAT scan several years ago. Past Medical History:  Diagnosis Date   Asthma    Carpal tunnel syndrome on left    Complication of anesthesia 07/2014   Mouth was swollen on inside, Front tooth was chipped   Diabetes mellitus    19 years ago   Diabetic neuropathy (HCC)    Diabetic retinopathy (HCC)    Essential hypertension 12/23/2015   GERD (gastroesophageal reflux disease)    Headache    Migraine- a long time ago   Hypercholesteremia    Hyperlipidemia 12/23/2015   Hypertension    Muscle spasms of lower extremity    legs and weakness   Pneumonia    Restless leg    Statin myopathy    Type 1 diabetes mellitus (HCC)    Vitreous hemorrhage (HCC) 08/08/2014   Vitreous hemorrhage of right eye (HCC) 08/28/2014   Vitreous hemorrhage, left eye (HCC)    Past Surgical History:  Procedure Laterality Date   CARDIAC CATHETERIZATION N/A 12/27/2015   Procedure: Left Heart  Cath and Coronary Angiography;  Surgeon: Debby DELENA Sor, MD;  [facility]: [facility];  Service: Cardiovascular;  Laterality: N/A;   CESAREAN SECTION  1993, 1998, 2002   COLONOSCOPY WITH PROPOFOL  N/A 06/16/2021   Procedure: COLONOSCOPY WITH PROPOFOL ;  Surgeon: Cindie Carlin POUR, DO;  Location: [facility];  Service: Endoscopy;  Laterality: N/A;  12:45 / ASA 2   DILATION AND CURETTAGE OF UTERUS  1997   GAS/FLUID EXCHANGE Right 08/28/2014   Procedure: GAS/FLUID EXCHANGE;  Surgeon: Norleen JONETTA Ku, MD;  Location: [facility] OR;  Service: Ophthalmology;  Laterality: Right;   LASER PHOTO ABLATION     left x 2, right x 1   LASER PHOTO ABLATION Right 08/28/2014   Procedure: LASER PHOTO ABLATION;  Surgeon: Norleen JONETTA Ku, MD;  Location: [facility] OR;  Service: Ophthalmology;  Laterality: Right;   MEMBRANE PEEL Right 08/28/2014   Procedure: MEMBRANE PEEL;  Surgeon: Norleen JONETTA Ku, MD;  [facility] OR;  Service: Ophthalmology;  Laterality: Right;   PARS PLANA VITRECTOMY Right 08/28/2014   Procedure: PARS PLANA VITRECTOMY WITH 25 GAUGE;  Surgeon: Norleen JONETTA Ku, MD;  Location: [facility] OR;  Service: Ophthalmology;  Laterality: Right;   PARS PLANA VITRECTOMY Left 12/24/2014   Procedure: LEFT PARS PLANA VITRECTOMY WITH 25 GAUGE WITH ENDOLASER ;  Surgeon: Arley DELENA Ruder, MD;  Location:  [facility] OR;  Service: Ophthalmology;  Laterality: Left;   POLYPECTOMY  06/16/2021   Procedure: POLYPECTOMY;  Surgeon: Cindie Carlin POUR, DO;  [facility]: [facility];  Service: Endoscopy;;   TUBAL LIGATION  2002   WISDOM TOOTH EXTRACTION     age 61   Current Outpatient Medications on File Prior to Visit  Medication Sig Dispense Refill   albuterol  (PROAIR  HFA) 108 (90 Base) MCG/ACT inhaler 2 PUFFS EVERY 6 HOURS AS NEEDED for shortness of breath 3 each 3   brimonidine  (ALPHAGAN ) 0.2 % ophthalmic solution 1 drop 2 (two) times daily.     carvedilol  (COREG ) 25 MG tablet TAKE 1 TABLET BY MOUTH ONCE DAILY AT 6 AM 90 tablet 0   Continuous Glucose  Sensor (DEXCOM G7 SENSOR) MISC 1 Device by Does not apply route every 14 (fourteen) days. 2 each 11   Evolocumab  (REPATHA  SURECLICK) 140 MG/ML SOAJ Inject 140 mg into the skin every 14 (fourteen) days. 2 mL 2   Fluticasone  Furoate 100 MCG/ACT AEPB Inhale 2 puffs into the lungs 2 (two) times daily. 30 each 11   Glucose Blood (BLOOD GLUCOSE TEST STRIPS 333) STRP 1 strip by In Vitro route in the morning, at noon, and at bedtime. 300 strip 3   insulin  glargine (SEMGLEE ) 100 UNIT/ML injection Inject 0.35 mLs (35 Units total) into the skin daily. 30 mL 3   insulin  lispro (HUMALOG ) 100 UNIT/ML injection INJECT 3 TO 8 UNITS SUBCUTANEOUSLY THREE TIMES DAILY WITH MEALS 30 mL 3   LANTUS  SOLOSTAR 100 UNIT/ML Solostar Pen INJECT 35 UNITS SUBQ ONCE DAILY.   INCREASE BY 1 UNIT EACH DAY UNTIL FASTING BLOOD GLUCOSE IS LESS THAN 130. THEN HOLD AT THAT DOSE 30 mL 0   losartan  (COZAAR ) 100 MG tablet Take 1 tablet by mouth once daily 90 tablet 0   montelukast  (SINGULAIR ) 10 MG tablet Take 1 tablet (10 mg total) by mouth at bedtime. 30 tablet 11   OVER THE COUNTER MEDICATION Take 2 tablets by mouth daily as needed (restless legs). Restless leg relief     terbinafine  (LAMISIL ) 250 MG tablet Take 1 tablet by mouth once daily 30 tablet 0   No current facility-administered medications on file prior to visit.   Allergies  Allergen Reactions   Fish-Derived Products Hives and Shortness Of Breath   Demerol  Itching and Rash   Morphine  And Codeine  Itching and Rash        Zocor [Simvastatin] Itching and Other (See Comments)    Leg pain   Social History   Socioeconomic History   Marital status: Married    Spouse name: Not on file   Number of children: Not on file   Years of education: Not on file   Highest education level: Not on file  Occupational History   Not on file  Tobacco Use   Smoking status: Every Day    Current packs/day: 2.00    Average packs/day: 2.0 packs/day for 9.0 years (18.0 ttl pk-yrs)     Types: Cigarettes   Smokeless tobacco: Never  Substance and Sexual Activity   Alcohol use: No   Drug use: No   Sexual activity: Yes    Partners: Male    Birth control/protection: None, Surgical    Comment: tubal   Other Topics Concern   Not on file  Social History Narrative   Married, lives with spouse and 2 kids   Occupation: building surveyor   Social Drivers of Health   Financial Resource Strain: Not on  file  Food Insecurity: Not on file  Transportation Needs: Not on file  Physical Activity: Not on file  Stress: Not on file  Social Connections: Not on file  Intimate Partner Violence: Not on file     Review of Systems  All other systems reviewed and are negative.      Objective:   Physical Exam Vitals reviewed.  Constitutional:      Appearance: She is well-developed.  Neck:     Thyroid: No thyromegaly.  Cardiovascular:     Rate and Rhythm: Normal rate and regular rhythm.     Heart sounds: Normal heart sounds. No murmur heard. Pulmonary:     Effort: Pulmonary effort is normal. No respiratory distress.     Breath sounds: No decreased breath sounds, wheezing, rhonchi or rales.  Abdominal:     General: Bowel sounds are normal. There is no distension.     Palpations: Abdomen is soft.     Tenderness: There is no abdominal tenderness. There is no guarding or rebound.  Musculoskeletal:     Cervical back: Neck supple.  Lymphadenopathy:     Cervical: No cervical adenopathy.           Assessment & Plan:  Diabetes mellitus type 1, controlled, insulin  dependent (HCC) - Plan: Hemoglobin A1c, CBC with Differential/Platelet, COMPLETE METABOLIC PANEL WITH GFR, Lipid panel  Tobacco use disorder  Essential hypertension  Pure hypercholesterolemia  ASCVD (arteriosclerotic cardiovascular disease) I am very concerned about the patient's cholesterol.  She has known cardiovascular disease with coronary atherosclerosis seen on CAT scan.  Her LDL cholesterol remains  extremely high and she has been unable to tolerate any statin, or Zetia , and her insurance refused Repatha .  Therefore I will try the patient on leqvio  284 mg injected once.  Repeat the injection in 3 months.  Then she will receive the injection every 6 months thereafter.  Hopefully we can get this ordered for her.  Strongly encourage smoking cessation.  Blood pressure today is excellent at 120/68.  Try to encourage the patient to be consistent in taking her mealtime insulin .  She is currently taking 15 units of rapid insulin  with supper when she remembers to do so.  I encouraged her to try to be 100% compliant with this as this seems to be the gap in her sugar coverage.

## 2023-04-09 ENCOUNTER — Other Ambulatory Visit: Payer: Self-pay

## 2023-04-09 ENCOUNTER — Other Ambulatory Visit: Payer: Self-pay | Admitting: Family Medicine

## 2023-04-09 DIAGNOSIS — E119 Type 2 diabetes mellitus without complications: Secondary | ICD-10-CM

## 2023-04-09 DIAGNOSIS — E7800 Pure hypercholesterolemia, unspecified: Secondary | ICD-10-CM

## 2023-04-09 DIAGNOSIS — E103599 Type 1 diabetes mellitus with proliferative diabetic retinopathy without macular edema, unspecified eye: Secondary | ICD-10-CM

## 2023-04-09 DIAGNOSIS — I10 Essential (primary) hypertension: Secondary | ICD-10-CM

## 2023-04-09 LAB — LIPID PANEL
Cholesterol: 239 mg/dL — ABNORMAL HIGH (ref <200)
HDL: 32 mg/dL — ABNORMAL LOW (ref >=50)
LDL Cholesterol (Calc): 178 mg/dL — ABNORMAL HIGH
Non-HDL Cholesterol (Calc): 207 mg/dL — ABNORMAL HIGH (ref <130)
Total CHOL/HDL Ratio: 7.5 (calc) — ABNORMAL HIGH (ref <5.0)
Triglycerides: 143 mg/dL (ref <150)

## 2023-04-09 LAB — CBC WITH DIFFERENTIAL/PLATELET
Absolute Lymphocytes: 2864 {cells}/uL (ref 850–3900)
Absolute Monocytes: 568 {cells}/uL (ref 200–950)
Basophils Absolute: 17 {cells}/uL (ref 0–200)
Basophils Relative: 0.2 %
Eosinophils Absolute: 120 {cells}/uL (ref 15–500)
Eosinophils Relative: 1.4 %
HCT: 46.2 % — ABNORMAL HIGH (ref 35.0–45.0)
Hemoglobin: 15.3 g/dL (ref 11.7–15.5)
MCH: 31 pg (ref 27.0–33.0)
MCHC: 33.1 g/dL (ref 32.0–36.0)
MCV: 93.5 fL (ref 80.0–100.0)
MPV: 11.4 fL (ref 7.5–12.5)
Monocytes Relative: 6.6 %
Neutro Abs: 5031 {cells}/uL (ref 1500–7800)
Neutrophils Relative %: 58.5 %
Platelets: 211 10*3/uL (ref 140–400)
RBC: 4.94 10*6/uL (ref 3.80–5.10)
RDW: 11.8 % (ref 11.0–15.0)
Total Lymphocyte: 33.3 %
WBC: 8.6 10*3/uL (ref 3.8–10.8)

## 2023-04-09 LAB — COMPLETE METABOLIC PANEL WITH GFR
AG Ratio: 1.4 (calc) (ref 1.0–2.5)
ALT: 32 U/L — ABNORMAL HIGH (ref 6–29)
AST: 21 U/L (ref 10–35)
Albumin: 4.2 g/dL (ref 3.6–5.1)
Alkaline phosphatase (APISO): 80 U/L (ref 31–125)
BUN: 16 mg/dL (ref 7–25)
CO2: 29 mmol/L (ref 20–32)
Calcium: 10.2 mg/dL (ref 8.6–10.2)
Chloride: 101 mmol/L (ref 98–110)
Creat: 0.73 mg/dL (ref 0.50–0.99)
Globulin: 3 g/dL (ref 1.9–3.7)
Glucose, Bld: 207 mg/dL — ABNORMAL HIGH (ref 65–99)
Potassium: 4.4 mmol/L (ref 3.5–5.3)
Sodium: 140 mmol/L (ref 135–146)
Total Bilirubin: 0.8 mg/dL (ref 0.2–1.2)
Total Protein: 7.2 g/dL (ref 6.1–8.1)
eGFR: 101 mL/min/{1.73_m2} (ref >=60)

## 2023-04-09 LAB — HEMOGLOBIN A1C
Hgb A1c MFr Bld: 8.8 %{Hb} — ABNORMAL HIGH (ref <5.7)
Mean Plasma Glucose: 206 mg/dL
eAG (mmol/L): 11.4 mmol/L

## 2023-04-12 NOTE — Telephone Encounter (Signed)
 Requested Prescriptions  Pending Prescriptions Disp Refills   carvedilol  (COREG ) 25 MG tablet [Pharmacy Med Name: Carvedilol  25 MG Oral Tablet] 90 tablet 0    Sig: TAKE 1 TABLET BY MOUTH ONCE DAILY AT  6  AM     Cardiovascular: Beta Blockers 3 Failed - 04/12/2023  1:54 PM      Failed - ALT in normal range and within 360 days    ALT  Date Value Ref Range Status  04/08/2023 32 (H) 6 - 29 U/L Final         Failed - Valid encounter within last 6 months    Recent Outpatient Visits           2 years ago Proliferative diabetic retinopathy associated with type 1 diabetes mellitus, unspecified laterality, unspecified proliferative retinopathy type (HCC)   [facility] Pickard, Butler DASEN, MD   3 years ago Diabetes mellitus type 1, controlled, insulin  dependent (HCC)   [facility] Pickard, Butler DASEN, MD   4 years ago Diabetes mellitus type 1, controlled, insulin  dependent (HCC)   [facility] Pickard, Butler DASEN, MD   5 years ago Diabetes mellitus type 1, controlled, insulin  dependent (HCC)   [facility] Pickard, Butler DASEN, MD   5 years ago Controlled diabetes mellitus type 1 with complications (HCC)   [facility] Pickard, Butler DASEN, MD              Passed - Cr in normal range and within 360 days    Creat  Date Value Ref Range Status  04/08/2023 0.73 0.50 - 0.99 mg/dL Final   Creatinine, Urine  Date Value Ref Range Status  01/05/2023 77 20 - 275 mg/dL Final         Passed - AST in normal range and within 360 days    AST  Date Value Ref Range Status  04/08/2023 21 10 - 35 U/L Final         Passed - Last BP in normal range    BP Readings from Last 1 Encounters:  04/08/23 120/68         Passed - Last Heart Rate in normal range    Pulse Readings from Last 1 Encounters:  04/08/23 (!) 103

## 2023-04-21 ENCOUNTER — Ambulatory Visit (INDEPENDENT_AMBULATORY_CARE_PROVIDER_SITE_OTHER): Payer: 59

## 2023-04-21 DIAGNOSIS — Z7984 Long term (current) use of oral hypoglycemic drugs: Secondary | ICD-10-CM

## 2023-04-21 DIAGNOSIS — E119 Type 2 diabetes mellitus without complications: Secondary | ICD-10-CM | POA: Diagnosis not present

## 2023-04-21 DIAGNOSIS — Z794 Long term (current) use of insulin: Secondary | ICD-10-CM | POA: Diagnosis not present

## 2023-04-21 LAB — HM DIABETES EYE EXAM

## 2023-04-21 NOTE — Progress Notes (Signed)
[name] arrived 04/21/2023 and has given verbal consent to obtain images and complete their overdue diabetic retinal screening.  The images have been sent to an ophthalmologist or optometrist for review and interpretation.  Results will be sent back to Donita Brooks, MD for review.  Patient has been informed they will be contacted when we receive the results via telephone or MyChart

## 2023-07-02 ENCOUNTER — Other Ambulatory Visit: Payer: Self-pay | Admitting: Family Medicine

## 2023-07-02 NOTE — Telephone Encounter (Signed)
 Requested Prescriptions  Pending Prescriptions Disp Refills   losartan (COZAAR) 100 MG tablet [Pharmacy Med Name: Losartan Potassium 100 MG Oral Tablet] 90 tablet 0    Sig: Take 1 tablet by mouth once daily     Cardiovascular:  Angiotensin Receptor Blockers Passed - 07/02/2023  4:16 PM      Passed - Cr in normal range and within 180 days    Creat  Date Value Ref Range Status  04/08/2023 0.73 0.50 - 0.99 mg/dL Final   Creatinine, Urine  Date Value Ref Range Status  01/05/2023 77 20 - 275 mg/dL Final         Passed - K in normal range and within 180 days    Potassium  Date Value Ref Range Status  04/08/2023 4.4 3.5 - 5.3 mmol/L Final         Passed - Patient is not pregnant      Passed - Last BP in normal range    BP Readings from Last 1 Encounters:  04/08/23 120/68         Passed - Valid encounter within last 6 months    Recent Outpatient Visits           2 months ago Diabetes mellitus type 1, controlled, insulin dependent (HCC)   [place] [facility] Donita Brooks, MD   5 months ago Diabetes mellitus type 1, controlled, insulin dependent [facility])   [facility] Donita Brooks, MD   1 year ago Diabetes mellitus type 1, controlled, insulin dependent [facility])   [place] [facility] Donita Brooks, MD   1 year ago Diabetes mellitus type 1, controlled, insulin dependent [facility])    [facility] Pickard, Priscille Heidelberg, MD

## 2023-07-07 ENCOUNTER — Other Ambulatory Visit: Payer: Self-pay | Admitting: Family Medicine

## 2023-07-07 NOTE — Telephone Encounter (Signed)
 Requested Prescriptions  Pending Prescriptions Disp Refills   carvedilol (COREG) 25 MG tablet [Pharmacy Med Name: Carvedilol 25 MG Oral Tablet] 90 tablet 0    Sig: TAKE 1 TABLET BY MOUTH ONCE DAILY AT 6 AM     Cardiovascular: Beta Blockers 3 Failed - 07/07/2023  4:13 PM      Failed - ALT in normal range and within 360 days    ALT  Date Value Ref Range Status  04/08/2023 32 (H) 6 - 29 U/L Final         Passed - Cr in normal range and within 360 days    Creat  Date Value Ref Range Status  04/08/2023 0.73 0.50 - 0.99 mg/dL Final   Creatinine, Urine  Date Value Ref Range Status  01/05/2023 77 20 - 275 mg/dL Final         Passed - AST in normal range and within 360 days    AST  Date Value Ref Range Status  04/08/2023 21 10 - 35 U/L Final         Passed - Last BP in normal range    BP Readings from Last 1 Encounters:  04/08/23 120/68         Passed - Last Heart Rate in normal range    Pulse Readings from Last 1 Encounters:  04/08/23 (!) 103         Passed - Valid encounter within last 6 months    Recent Outpatient Visits           3 months ago Diabetes mellitus type 1, controlled, insulin dependent (HCC)   [facility] Pickard, Priscille Heidelberg, MD   6 months ago Diabetes mellitus type 1, controlled, insulin dependent [facility])   [place] [facility] Donita Brooks, MD   1 year ago Diabetes mellitus type 1, controlled, insulin dependent [facility])   [facility] Donita Brooks, MD   1 year ago Diabetes mellitus type 1, controlled, insulin dependent [facility])    [facility] Pickard, Priscille Heidelberg, MD

## 2023-07-09 ENCOUNTER — Encounter: Payer: Self-pay | Admitting: Family Medicine

## 2023-07-09 ENCOUNTER — Ambulatory Visit: Payer: 59 | Admitting: Family Medicine

## 2023-07-09 VITALS — BP 130/82 | HR 103 | Temp 98.3°F | Ht 63.0 in | Wt 177.0 lb

## 2023-07-09 DIAGNOSIS — I2510 Atherosclerotic heart disease of native coronary artery without angina pectoris: Secondary | ICD-10-CM

## 2023-07-09 DIAGNOSIS — F17200 Nicotine dependence, unspecified, uncomplicated: Secondary | ICD-10-CM | POA: Diagnosis not present

## 2023-07-09 DIAGNOSIS — I10 Essential (primary) hypertension: Secondary | ICD-10-CM

## 2023-07-09 DIAGNOSIS — E1059 Type 1 diabetes mellitus with other circulatory complications: Secondary | ICD-10-CM

## 2023-07-09 DIAGNOSIS — E7800 Pure hypercholesterolemia, unspecified: Secondary | ICD-10-CM

## 2023-07-09 NOTE — Progress Notes (Signed)
 Subjective:    Patient ID: [name], female    DOB: May 30, 1975, 48 y.o.   MRN: [id number] Patient is here today for a follow-up of her diabetes.  She is currently on 50 units of Semglee.  She started back taking 6 units of rapid insulin with breakfast, 3 to 5 units with lunch and 6 units with dinner.  She is now in range 62% of the time with 25% of the time being high.  Her estimated A1c over the last 90 days according to her continuous blood glucose meter at 7.2.  I am very proud of the patient for trying harder to manage her sugars.  She states that she is still seeing postprandial sugars greater than 200 however if she increases her mealtime insulin she will have hypoglycemic episodes especially at night.  Unfortunately she has known coronary artery disease.  She is unable to tolerate any statin.  She has tried and failed several statins including Zocor and Lipitor Crestor.  She also states that she cannot take Zetia.  Her insurance would not cover Repatha.  She has documented coronary atherosclerosis on CAT scan several years ago. Past Medical History:  Diagnosis Date   Asthma    Carpal tunnel syndrome on left    Complication of anesthesia 07/2014   Mouth was swollen on inside, Front tooth was chipped   Diabetes mellitus    19 years ago   Diabetic neuropathy (HCC)    Diabetic retinopathy (HCC)    Essential hypertension 12/23/2015   GERD (gastroesophageal reflux disease)    Headache    Migraine- a long time ago   Hypercholesteremia    Hyperlipidemia 12/23/2015   Hypertension    Muscle spasms of lower extremity    legs and weakness   Pneumonia    Restless leg    Statin myopathy    Type 1 diabetes mellitus (HCC)    Vitreous hemorrhage (HCC) 08/08/2014   Vitreous hemorrhage of right eye (HCC) 08/28/2014   Vitreous hemorrhage, left eye (HCC)    Past Surgical History:  Procedure Laterality Date   CARDIAC CATHETERIZATION N/A 12/27/2015   Procedure: Left Heart Cath and Coronary  Angiography;  Surgeon: Lennette Bihari, MD;  [facility];  Service: Cardiovascular;  Laterality: N/A;   CESAREAN SECTION  1993, 1998, 2002   COLONOSCOPY WITH PROPOFOL N/A 06/16/2021   Procedure: COLONOSCOPY WITH PROPOFOL;  Surgeon: Lanelle Bal, DO;  [facility];  Service: Endoscopy;  Laterality: N/A;  12:45 / ASA 2   DILATION AND CURETTAGE OF UTERUS  1997   GAS/FLUID EXCHANGE Right 08/28/2014   Procedure: GAS/FLUID EXCHANGE;  Surgeon: Sherrie George, MD;  Location: [facility] OR;  Service: Ophthalmology;  Laterality: Right;   LASER PHOTO ABLATION     left x 2, right x 1   LASER PHOTO ABLATION Right 08/28/2014   Procedure: LASER PHOTO ABLATION;  Surgeon: Sherrie George, MD;  Location: [facility] OR;  Service: Ophthalmology;  Laterality: Right;   MEMBRANE PEEL Right 08/28/2014   Procedure: MEMBRANE PEEL;  Surgeon: Sherrie George, MD;  Location: [facility] OR;  Service: Ophthalmology;  Laterality: Right;   PARS PLANA VITRECTOMY Right 08/28/2014   Procedure: PARS PLANA VITRECTOMY WITH 25 GAUGE;  Surgeon: Sherrie George, MD;  [facility] OR;  Service: Ophthalmology;  Laterality: Right;   PARS PLANA VITRECTOMY Left 12/24/2014   Procedure: LEFT PARS PLANA VITRECTOMY WITH 25 GAUGE WITH ENDOLASER ;  Surgeon: Edmon Crape, MD;  [facility];  Service: Ophthalmology;  Laterality: Left;   POLYPECTOMY  06/16/2021   Procedure: POLYPECTOMY;  Surgeon: Lanelle Bal, DO;  [facility];  Service: Endoscopy;;   TUBAL LIGATION  2002   WISDOM TOOTH EXTRACTION     age 44   Current Outpatient Medications on File Prior to Visit  Medication Sig Dispense Refill   albuterol (PROAIR HFA) 108 (90 Base) MCG/ACT inhaler 2 PUFFS EVERY 6 HOURS AS NEEDED for shortness of breath 3 each 3   brimonidine (ALPHAGAN) 0.2 % ophthalmic solution 1 drop 2 (two) times daily.     carvedilol (COREG) 25 MG tablet TAKE 1 TABLET BY MOUTH ONCE DAILY AT 6 AM 90 tablet 0   Continuous Glucose Sensor (DEXCOM G7  SENSOR) MISC 1 Device by Does not apply route every 14 (fourteen) days. 2 each 11   Evolocumab (REPATHA SURECLICK) 140 MG/ML SOAJ Inject 140 mg into the skin every 14 (fourteen) days. 2 mL 2   Fluticasone Furoate 100 MCG/ACT AEPB Inhale 2 puffs into the lungs 2 (two) times daily. 30 each 11   Glucose Blood (BLOOD GLUCOSE TEST STRIPS 333) STRP 1 strip by In Vitro route in the morning, at noon, and at bedtime. 300 strip 3   insulin glargine (SEMGLEE) 100 UNIT/ML injection Inject 0.35 mLs (35 Units total) into the skin daily. 30 mL 3   insulin lispro (HUMALOG) 100 UNIT/ML injection INJECT 3 TO 8 UNITS SUBCUTANEOUSLY THREE TIMES DAILY WITH MEALS 30 mL 3   LANTUS SOLOSTAR 100 UNIT/ML Solostar Pen INJECT 35 UNITS SUBQ ONCE DAILY.   INCREASE BY 1 UNIT EACH DAY UNTIL FASTING BLOOD GLUCOSE IS LESS THAN 130. THEN HOLD AT THAT DOSE 30 mL 0   losartan (COZAAR) 100 MG tablet Take 1 tablet by mouth once daily 90 tablet 0   montelukast (SINGULAIR) 10 MG tablet Take 1 tablet (10 mg total) by mouth at bedtime. 30 tablet 11   OVER THE COUNTER MEDICATION Take 2 tablets by mouth daily as needed (restless legs). Restless leg relief     terbinafine (LAMISIL) 250 MG tablet Take 1 tablet by mouth once daily 30 tablet 0   No current facility-administered medications on file prior to visit.   Allergies  Allergen Reactions   Fish-Derived Products Hives and Shortness Of Breath   Demerol Itching and Rash   Morphine And Codeine Itching and Rash        Zocor [Simvastatin] Itching and Other (See Comments)    Leg pain   Social History   Socioeconomic History   Marital status: Married    Spouse name: Not on file   Number of children: Not on file   Years of education: Not on file   Highest education level: Not on file  Occupational History   Not on file  Tobacco Use   Smoking status: Every Day    Current packs/day: 2.00    Average packs/day: 2.0 packs/day for 9.0 years (18.0 ttl pk-yrs)    Types: Cigarettes    Smokeless tobacco: Never  Substance and Sexual Activity   Alcohol use: No   Drug use: No   Sexual activity: Yes    Partners: Male    Birth control/protection: None, Surgical    Comment: tubal   Other Topics Concern   Not on file  Social History Narrative   Married, lives with spouse and 2 kids   Occupation: Building surveyor   Social Drivers of Health   Financial Resource Strain: Not  on file  Food Insecurity: Not on file  Transportation Needs: Not on file  Physical Activity: Not on file  Stress: Not on file  Social Connections: Not on file  Intimate Partner Violence: Not on file     Review of Systems  All other systems reviewed and are negative.      Objective:   Physical Exam Vitals reviewed.  Constitutional:      Appearance: She is well-developed.  Neck:     Thyroid: No thyromegaly.  Cardiovascular:     Rate and Rhythm: Normal rate and regular rhythm.     Heart sounds: Normal heart sounds. No murmur heard. Pulmonary:     Effort: Pulmonary effort is normal. No respiratory distress.     Breath sounds: No decreased breath sounds, wheezing, rhonchi or rales.  Abdominal:     General: Bowel sounds are normal. There is no distension.     Palpations: Abdomen is soft.     Tenderness: There is no abdominal tenderness. There is no guarding or rebound.  Musculoskeletal:     Cervical back: Neck supple.  Lymphadenopathy:     Cervical: No cervical adenopathy.           Assessment & Plan:  ASCVD (arteriosclerotic cardiovascular disease)  Tobacco use disorder  Essential hypertension  Pure hypercholesterolemia  Type 1 diabetes mellitus with other circulatory complication (HCC) - Plan: Hemoglobin A1c, COMPLETE METABOLIC PANEL WITHOUT GFR, Lipid panel I am very proud of the patient for trying harder to manage her sugars.  I recommended decreasing her Semglee from 50 units to 40 units to avoid nocturnal hypoglycemia and uptitrating her mealtime insulin to achieve  2-hour postprandial sugars under 200.  She will increase her a.m./breakfast mealtime insulin to 6 units.  She will increase her supper insulin to 8 units.  Continue to encourage smoking cessation.  Blood pressure is excellent.  Would like to pursue leqvio for hld.

## 2023-07-10 LAB — HEMOGLOBIN A1C
Hgb A1c MFr Bld: 7.8 %{Hb} — ABNORMAL HIGH (ref <5.7)
Mean Plasma Glucose: 177 mg/dL
eAG (mmol/L): 9.8 mmol/L

## 2023-07-10 LAB — COMPLETE METABOLIC PANEL WITHOUT GFR
AG Ratio: 1.6 (calc) (ref 1.0–2.5)
ALT: 27 U/L (ref 6–29)
AST: 18 U/L (ref 10–35)
Albumin: 4.4 g/dL (ref 3.6–5.1)
Alkaline phosphatase (APISO): 70 U/L (ref 31–125)
BUN: 16 mg/dL (ref 7–25)
CO2: 29 mmol/L (ref 20–32)
Calcium: 10 mg/dL (ref 8.6–10.2)
Chloride: 103 mmol/L (ref 98–110)
Creat: 0.8 mg/dL (ref 0.50–0.99)
Globulin: 2.7 g/dL (ref 1.9–3.7)
Glucose, Bld: 191 mg/dL — ABNORMAL HIGH (ref 65–99)
Potassium: 4.2 mmol/L (ref 3.5–5.3)
Sodium: 139 mmol/L (ref 135–146)
Total Bilirubin: 0.5 mg/dL (ref 0.2–1.2)
Total Protein: 7.1 g/dL (ref 6.1–8.1)

## 2023-07-10 LAB — LIPID PANEL
Cholesterol: 224 mg/dL — ABNORMAL HIGH (ref <200)
HDL: 32 mg/dL — ABNORMAL LOW (ref >=50)
LDL Cholesterol (Calc): 166 mg/dL — ABNORMAL HIGH
Non-HDL Cholesterol (Calc): 192 mg/dL — ABNORMAL HIGH (ref <130)
Total CHOL/HDL Ratio: 7 (calc) — ABNORMAL HIGH (ref <5.0)
Triglycerides: 128 mg/dL (ref <150)

## 2023-07-12 ENCOUNTER — Other Ambulatory Visit: Payer: Self-pay

## 2023-07-12 DIAGNOSIS — E103599 Type 1 diabetes mellitus with proliferative diabetic retinopathy without macular edema, unspecified eye: Secondary | ICD-10-CM

## 2023-07-12 DIAGNOSIS — E1059 Type 1 diabetes mellitus with other circulatory complications: Secondary | ICD-10-CM

## 2023-07-12 DIAGNOSIS — I2510 Atherosclerotic heart disease of native coronary artery without angina pectoris: Secondary | ICD-10-CM

## 2023-07-12 DIAGNOSIS — E7800 Pure hypercholesterolemia, unspecified: Secondary | ICD-10-CM

## 2023-07-12 MED ORDER — REPATHA PUSHTRONEX SYSTEM 420 MG/3.5ML ~~LOC~~ SOCT: 420.0000 mg | SUBCUTANEOUS | 1 refills | Status: DC

## 2023-07-14 NOTE — Progress Notes (Signed)
 No diabetic retinopathy to either eye per report. Tried to call pt to advise, no answer and no voicemail. Mjp,lpn

## 2023-09-27 ENCOUNTER — Other Ambulatory Visit: Payer: Self-pay | Admitting: Family Medicine

## 2023-10-01 ENCOUNTER — Other Ambulatory Visit: Payer: Self-pay | Admitting: Family Medicine

## 2023-10-08 ENCOUNTER — Ambulatory Visit: Admitting: Family Medicine

## 2023-10-17 ENCOUNTER — Other Ambulatory Visit: Payer: Self-pay | Admitting: Family Medicine

## 2023-10-19 NOTE — Telephone Encounter (Signed)
 Requested by interface surescripts. Future visit 11/01/23. Courtesy refill.  Requested Prescriptions  Pending Prescriptions Disp Refills   LANTUS  SOLOSTAR 100 UNIT/ML Solostar Pen [Pharmacy Med Name: Lantus  SoloStar 100 UNIT/ML Subcutaneous Solution Pen-injector] 30 mL 0    Sig: INJECT 35 UNITS SUBCUTANEOUSLY ONCE DAILY     Endocrinology:  Diabetes - Insulins Passed - 10/19/2023  3:23 PM      Passed - HBA1C is between 0 and 7.9 and within 180 days    Hgb A1c MFr Bld  Date Value Ref Range Status  07/09/2023 7.8 (H) <5.7 % of total Hgb Final    Comment:    For someone without known diabetes, a hemoglobin A1c value of 6.5% or greater indicates that they may have  diabetes and this should be confirmed with a follow-up  test. . For someone with known diabetes, a value <7% indicates  that their diabetes is well controlled and a value  greater than or equal to 7% indicates suboptimal  control. A1c targets should be individualized based on  duration of diabetes, age, comorbid conditions, and  other considerations. . Currently, no consensus exists regarding use of hemoglobin A1c for diagnosis of diabetes for children. SABRA Amy - Valid encounter within last 6 months    Recent Outpatient Visits           3 months ago ASCVD (arteriosclerotic cardiovascular disease)   [place] [facility] Duanne Butler DASEN, MD   6 months ago Diabetes mellitus type 1, controlled, insulin  dependent [facility])   [facility] Duanne Butler DASEN, MD   9 months ago Diabetes mellitus type 1, controlled, insulin  dependent [facility])   [place] [facility] Duanne Butler DASEN, MD   1 year ago Diabetes mellitus type 1, controlled, insulin  dependent [facility])   [place] [facility] Duanne Butler DASEN, MD   1 year ago Diabetes mellitus type 1, controlled, insulin  dependent [facility])   [facility] Pickard, Butler DASEN,  MD

## 2023-11-01 ENCOUNTER — Encounter: Payer: Self-pay | Admitting: Family Medicine

## 2023-11-01 ENCOUNTER — Ambulatory Visit: Admitting: Family Medicine

## 2023-11-01 VITALS — BP 120/82 | HR 91 | Temp 98.0°F | Ht 63.0 in | Wt 174.0 lb

## 2023-11-01 DIAGNOSIS — E1059 Type 1 diabetes mellitus with other circulatory complications: Secondary | ICD-10-CM | POA: Diagnosis not present

## 2023-11-01 DIAGNOSIS — E7800 Pure hypercholesterolemia, unspecified: Secondary | ICD-10-CM | POA: Diagnosis not present

## 2023-11-01 DIAGNOSIS — Z1231 Encounter for screening mammogram for malignant neoplasm of breast: Secondary | ICD-10-CM

## 2023-11-01 DIAGNOSIS — I2510 Atherosclerotic heart disease of native coronary artery without angina pectoris: Secondary | ICD-10-CM

## 2023-11-01 MED ORDER — TIRZEPATIDE 2.5 MG/0.5ML ~~LOC~~ SOAJ
2.5000 mg | SUBCUTANEOUS | 3 refills | Status: DC
Start: 2023-11-01 — End: 2023-11-23

## 2023-11-01 NOTE — Progress Notes (Signed)
 Subjective:    Patient ID: [name], female    DOB: 1975-06-03, 48 y.o.   MRN: [id number] Since I last saw the patient, her father suddenly.  A result, she has not exercising.  She is smoking more.  Her continuous blood glucose monitoring shows that she is in range 55% of the time.  35% of the time she is too high.  She typically sees the sugars being high later in the day after supper.  She states that she eats very little at breakfast and lunch but then eats a large supper.  She is currently using 35 to 40 units of Lantus  a day.  She will then use fast acting insulin  with meals about 5 units plus a correction factor based on how high her sugar is.  She is overdue for a Pap smear.  She is overdue for mammogram. Past Medical History:  Diagnosis Date   Asthma    Carpal tunnel syndrome on left    Complication of anesthesia 07/2014   Mouth was swollen on inside, Front tooth was chipped   Diabetes mellitus    19 years ago   Diabetic neuropathy (HCC)    Diabetic retinopathy (HCC)    Essential hypertension 12/23/2015   GERD (gastroesophageal reflux disease)    Headache    Migraine- a long time ago   Hypercholesteremia    Hyperlipidemia 12/23/2015   Hypertension    Muscle spasms of lower extremity    legs and weakness   Pneumonia    Restless leg    Statin myopathy    Type 1 diabetes mellitus (HCC)    Vitreous hemorrhage (HCC) 08/08/2014   Vitreous hemorrhage of right eye (HCC) 08/28/2014   Vitreous hemorrhage, left eye (HCC)    Past Surgical History:  Procedure Laterality Date   CARDIAC CATHETERIZATION N/A 12/27/2015   Procedure: Left Heart Cath and Coronary Angiography;  Surgeon: Debby DELENA Sor, MD;  [facility];  Service: Cardiovascular;  Laterality: N/A;   CESAREAN SECTION  1993, 1998, 2002   COLONOSCOPY WITH PROPOFOL  N/A 06/16/2021   Procedure: COLONOSCOPY WITH PROPOFOL ;  Surgeon: Cindie Carlin POUR, DO;  [facility];  Service: Endoscopy;   Laterality: N/A;  12:45 / ASA 2   DILATION AND CURETTAGE OF UTERUS  1997   GAS/FLUID EXCHANGE Right 08/28/2014   Procedure: GAS/FLUID EXCHANGE;  Surgeon: Norleen JONETTA Ku, MD;  [facility] OR;  Service: Ophthalmology;  Laterality: Right;   LASER PHOTO ABLATION     left x 2, right x 1   LASER PHOTO ABLATION Right 08/28/2014   Procedure: LASER PHOTO ABLATION;  Surgeon: Norleen JONETTA Ku, MD;  Location: [facility] OR;  Service: Ophthalmology;  Laterality: Right;   MEMBRANE PEEL Right 08/28/2014   Procedure: MEMBRANE PEEL;  Surgeon: Norleen JONETTA Ku, MD;  Location: [facility] OR;  Service: Ophthalmology;  Laterality: Right;   PARS PLANA VITRECTOMY Right 08/28/2014   Procedure: PARS PLANA VITRECTOMY WITH 25 GAUGE;  Surgeon: Norleen JONETTA Ku, MD;  Location: [facility] OR;  Service: Ophthalmology;  Laterality: Right;   PARS PLANA VITRECTOMY Left 12/24/2014   Procedure: LEFT PARS PLANA VITRECTOMY WITH 25 GAUGE WITH ENDOLASER ;  Surgeon: Arley DELENA Ruder, MD;  Location: [facility] OR;  Service: Ophthalmology;  Laterality: Left;   POLYPECTOMY  06/16/2021   Procedure: POLYPECTOMY;  Surgeon: Cindie Carlin POUR, DO;  [facility];  Service: Endoscopy;;   TUBAL LIGATION  2002   WISDOM TOOTH EXTRACTION     age  17   Current Outpatient Medications on File Prior to Visit  Medication Sig Dispense Refill   albuterol  (PROAIR  HFA) 108 (90 Base) MCG/ACT inhaler 2 PUFFS EVERY 6 HOURS AS NEEDED for shortness of breath 3 each 3   brimonidine  (ALPHAGAN ) 0.2 % ophthalmic solution 1 drop 2 (two) times daily.     carvedilol  (COREG ) 25 MG tablet TAKE 1 TABLET BY MOUTH ONCE DAILY AT  6AM 90 tablet 0   Continuous Glucose Receiver (DEXCOM G7 RECEIVER) DEVI SMARTSIG:Once     Continuous Glucose Sensor (DEXCOM G7 SENSOR) MISC 1 Device by Does not apply route every 14 (fourteen) days. 2 each 11   Evolocumab  with Infusor (REPATHA  PUSHTRONEX SYSTEM) 420 MG/3.5ML SOCT Inject 420 mg into the skin every 30 (thirty) days. 3.6 mL 1   Fluticasone  Furoate 100 MCG/ACT  AEPB Inhale 2 puffs into the lungs 2 (two) times daily. 30 each 11   Glucose Blood (BLOOD GLUCOSE TEST STRIPS 333) STRP 1 strip by In Vitro route in the morning, at noon, and at bedtime. 300 strip 3   insulin  lispro (HUMALOG ) 100 UNIT/ML injection INJECT 3 TO 8 UNITS SUBCUTANEOUSLY THREE TIMES DAILY WITH MEALS 30 mL 3   LANTUS  SOLOSTAR 100 UNIT/ML Solostar Pen INJECT 35 UNITS SUBQ ONCE DAILY.   INCREASE BY 1 UNIT EACH DAY UNTIL FASTING BLOOD GLUCOSE IS LESS THAN 130. THEN HOLD AT THAT DOSE 30 mL 0   LANTUS  SOLOSTAR 100 UNIT/ML Solostar Pen INJECT 35 UNITS SUBCUTANEOUSLY ONCE DAILY 30 mL 0   losartan  (COZAAR ) 100 MG tablet Take 1 tablet by mouth once daily 90 tablet 0   montelukast  (SINGULAIR ) 10 MG tablet Take 1 tablet (10 mg total) by mouth at bedtime. 30 tablet 11   OVER THE COUNTER MEDICATION Take 2 tablets by mouth daily as needed (restless legs). Restless leg relief     terbinafine  (LAMISIL ) 250 MG tablet Take 1 tablet by mouth once daily 30 tablet 0   No current facility-administered medications on file prior to visit.   Allergies  Allergen Reactions   Fish-Derived Products Hives and Shortness Of Breath   Demerol  Itching and Rash   Morphine  And Codeine  Itching and Rash        Zocor [Simvastatin] Itching and Other (See Comments)    Leg pain   Social History   Socioeconomic History   Marital status: Married    Spouse name: Not on file   Number of children: Not on file   Years of education: Not on file   Highest education level: Not on file  Occupational History   Not on file  Tobacco Use   Smoking status: Every Day    Current packs/day: 2.00    Average packs/day: 2.0 packs/day for 9.0 years (18.0 ttl pk-yrs)    Types: Cigarettes   Smokeless tobacco: Never  Substance and Sexual Activity   Alcohol use: No   Drug use: No   Sexual activity: Yes    Partners: Male    Birth control/protection: None, Surgical    Comment: tubal   Other Topics Concern   Not on file  Social  History Narrative   Married, lives with spouse and 2 kids   Occupation: Building surveyor   Social Drivers of Corporate investment banker Strain: Not on file  Food Insecurity: Not on file  Transportation Needs: Not on file  Physical Activity: Not on file  Stress: Not on file  Social Connections: Not on file  Intimate Partner Violence: Not on  file     Review of Systems  All other systems reviewed and are negative.      Objective:   Physical Exam Vitals reviewed.  Constitutional:      Appearance: She is well-developed.  Neck:     Thyroid: No thyromegaly.  Cardiovascular:     Rate and Rhythm: Normal rate and regular rhythm.     Heart sounds: Normal heart sounds. No murmur heard. Pulmonary:     Effort: Pulmonary effort is normal. No respiratory distress.     Breath sounds: No decreased breath sounds, wheezing, rhonchi or rales.  Abdominal:     General: Bowel sounds are normal. There is no distension.     Palpations: Abdomen is soft.     Tenderness: There is no abdominal tenderness. There is no guarding or rebound.  Musculoskeletal:     Cervical back: Neck supple.  Lymphadenopathy:     Cervical: No cervical adenopathy.           Assessment & Plan:  Type 1 diabetes mellitus with other circulatory complication (HCC) - Plan: Hemoglobin A1c, CBC with Differential/Platelet, Comprehensive metabolic panel with GFR, Lipid panel, Microalbumin/Creatinine Ratio, Urine  Pure hypercholesterolemia  ASCVD (arteriosclerotic cardiovascular disease)  Encounter for screening mammogram for malignant neoplasm of breast - Plan: MM Digital Screening I continue to hound the patient about smoking.  Her blood pressure is good today and I will check an A1c CBC STD panel urine emptying to creatinine ratio.  I recommended increasing her mealtime insulin  at supper to compensate.  She can increase mealtime insulin  until 2-hour postprandial sugars are less than 200.  Patient would like to try  Mounjaro  as an appetite suppressant and to help improve her blood sugars.  We can certainly try this.  I believe the appetite suppression may help reduce some of her hyperglycemia.  If she starts to see blood sugars fall below 150 prior to meals we will need to reduce her mealtime rapid insulin .  Schedule a mammogram.  Recommended a Pap smear but she prefers to get this from a female provider.

## 2023-11-02 ENCOUNTER — Ambulatory Visit: Payer: Self-pay | Admitting: Family Medicine

## 2023-11-02 LAB — COMPREHENSIVE METABOLIC PANEL WITH GFR
AG Ratio: 1.7 (calc) (ref 1.0–2.5)
ALT: 28 U/L (ref 6–29)
AST: 20 U/L (ref 10–35)
Albumin: 4.4 g/dL (ref 3.6–5.1)
Alkaline phosphatase (APISO): 62 U/L (ref 31–125)
BUN: 15 mg/dL (ref 7–25)
CO2: 29 mmol/L (ref 20–32)
Calcium: 9.5 mg/dL (ref 8.6–10.2)
Chloride: 104 mmol/L (ref 98–110)
Creat: 0.69 mg/dL (ref 0.50–0.99)
Globulin: 2.6 g/dL (ref 1.9–3.7)
Glucose, Bld: 136 mg/dL — ABNORMAL HIGH (ref 65–99)
Potassium: 3.9 mmol/L (ref 3.5–5.3)
Sodium: 140 mmol/L (ref 135–146)
Total Bilirubin: 0.6 mg/dL (ref 0.2–1.2)
Total Protein: 7 g/dL (ref 6.1–8.1)
eGFR: 106 mL/min/1.73m2 (ref >=60)

## 2023-11-02 LAB — CBC WITH DIFFERENTIAL/PLATELET
Absolute Lymphocytes: 2780 {cells}/uL (ref 850–3900)
Absolute Monocytes: 546 {cells}/uL (ref 200–950)
Basophils Absolute: 34 {cells}/uL (ref 0–200)
Basophils Relative: 0.4 %
Eosinophils Absolute: 109 {cells}/uL (ref 15–500)
Eosinophils Relative: 1.3 %
HCT: 46.1 % — ABNORMAL HIGH (ref 35.0–45.0)
Hemoglobin: 15.1 g/dL (ref 11.7–15.5)
MCH: 30.6 pg (ref 27.0–33.0)
MCHC: 32.8 g/dL (ref 32.0–36.0)
MCV: 93.5 fL (ref 80.0–100.0)
MPV: 11.7 fL (ref 7.5–12.5)
Monocytes Relative: 6.5 %
Neutro Abs: 4931 {cells}/uL (ref 1500–7800)
Neutrophils Relative %: 58.7 %
Platelets: 190 Thousand/uL (ref 140–400)
RBC: 4.93 Million/uL (ref 3.80–5.10)
RDW: 12.2 % (ref 11.0–15.0)
Total Lymphocyte: 33.1 %
WBC: 8.4 Thousand/uL (ref 3.8–10.8)

## 2023-11-02 LAB — LIPID PANEL
Cholesterol: 233 mg/dL — ABNORMAL HIGH (ref <200)
HDL: 31 mg/dL — ABNORMAL LOW (ref >=50)
LDL Cholesterol (Calc): 171 mg/dL — ABNORMAL HIGH
Non-HDL Cholesterol (Calc): 202 mg/dL — ABNORMAL HIGH (ref <130)
Total CHOL/HDL Ratio: 7.5 (calc) — ABNORMAL HIGH (ref <5.0)
Triglycerides: 159 mg/dL — ABNORMAL HIGH (ref <150)

## 2023-11-02 LAB — MICROALBUMIN / CREATININE URINE RATIO
Creatinine, Urine: 106 mg/dL (ref 20–275)
Microalb Creat Ratio: 7 mg/g{creat} (ref <30)
Microalb, Ur: 0.7 mg/dL

## 2023-11-02 LAB — HEMOGLOBIN A1C
Hgb A1c MFr Bld: 8.1 % — ABNORMAL HIGH (ref <5.7)
Mean Plasma Glucose: 186 mg/dL
eAG (mmol/L): 10.3 mmol/L

## 2023-11-04 ENCOUNTER — Other Ambulatory Visit: Payer: Self-pay

## 2023-11-04 DIAGNOSIS — E7800 Pure hypercholesterolemia, unspecified: Secondary | ICD-10-CM

## 2023-11-04 DIAGNOSIS — E1059 Type 1 diabetes mellitus with other circulatory complications: Secondary | ICD-10-CM

## 2023-11-04 DIAGNOSIS — E108 Type 1 diabetes mellitus with unspecified complications: Secondary | ICD-10-CM | POA: Insufficient documentation

## 2023-11-04 DIAGNOSIS — I2510 Atherosclerotic heart disease of native coronary artery without angina pectoris: Secondary | ICD-10-CM

## 2023-11-04 DIAGNOSIS — Z789 Other specified health status: Secondary | ICD-10-CM | POA: Insufficient documentation

## 2023-11-04 MED ORDER — REPATHA SURECLICK 140 MG/ML ~~LOC~~ SOAJ: 140.0000 mg | SUBCUTANEOUS | 2 refills | Status: AC

## 2023-11-23 ENCOUNTER — Telehealth: Payer: Self-pay

## 2023-11-23 ENCOUNTER — Other Ambulatory Visit: Payer: Self-pay

## 2023-11-23 DIAGNOSIS — E7800 Pure hypercholesterolemia, unspecified: Secondary | ICD-10-CM

## 2023-11-23 DIAGNOSIS — E103599 Type 1 diabetes mellitus with proliferative diabetic retinopathy without macular edema, unspecified eye: Secondary | ICD-10-CM

## 2023-11-23 DIAGNOSIS — I2510 Atherosclerotic heart disease of native coronary artery without angina pectoris: Secondary | ICD-10-CM

## 2023-11-23 DIAGNOSIS — E1059 Type 1 diabetes mellitus with other circulatory complications: Secondary | ICD-10-CM

## 2023-11-23 MED ORDER — TIRZEPATIDE 5 MG/0.5ML ~~LOC~~ SOAJ: 5.0000 mg | SUBCUTANEOUS | 0 refills | Status: DC

## 2023-11-23 NOTE — Telephone Encounter (Signed)
 Copied from CRM #[id number]. Topic: Clinical - Medication Question >> Nov 23, 2023  1:39 PM Sophia H wrote: Reason for CRM: Spoke with patients husband, husband states that patient was told she can up her dosage on tirzepatide  (MOUNJARO ) as long as she is tolerating it well. Please advise    [id number] - [place], [place] - [address]  Advised 3 day turnaround

## 2023-12-19 ENCOUNTER — Other Ambulatory Visit: Payer: Self-pay | Admitting: Family Medicine

## 2023-12-20 NOTE — Telephone Encounter (Signed)
 Requested Prescriptions  Pending Prescriptions Disp Refills   losartan  (COZAAR ) 100 MG tablet [Pharmacy Med Name: Losartan  Potassium 100 MG Oral Tablet] 90 tablet 0    Sig: Take 1 tablet by mouth once daily     Cardiovascular:  Angiotensin Receptor Blockers Passed - 12/20/2023  3:40 PM      Passed - Cr in normal range and within 180 days    Creat  Date Value Ref Range Status  11/01/2023 0.69 0.50 - 0.99 mg/dL Final   Creatinine, Urine  Date Value Ref Range Status  11/01/2023 106 20 - 275 mg/dL Final         Passed - K in normal range and within 180 days    Potassium  Date Value Ref Range Status  11/01/2023 3.9 3.5 - 5.3 mmol/L Final         Passed - Patient is not pregnant      Passed - Last BP in normal range    BP Readings from Last 1 Encounters:  11/01/23 120/82         Passed - Valid encounter within last 6 months    Recent Outpatient Visits           1 month ago Type 1 diabetes mellitus with other circulatory complication [facility])   [facility] Duanne Butler DASEN, MD   5 months ago ASCVD (arteriosclerotic cardiovascular disease)   [place] [facility] Duanne Butler DASEN, MD   8 months ago Diabetes mellitus type 1, controlled, insulin  dependent [facility])   [place] [facility] Duanne Butler DASEN, MD   11 months ago Diabetes mellitus type 1, controlled, insulin  dependent [facility])   [place] [facility] Duanne Butler DASEN, MD   1 year ago Diabetes mellitus type 1, controlled, insulin  dependent [facility])   [place] [facility] Pickard, Butler DASEN, MD

## 2023-12-22 ENCOUNTER — Ambulatory Visit (INDEPENDENT_AMBULATORY_CARE_PROVIDER_SITE_OTHER): Admitting: Family Medicine

## 2023-12-22 ENCOUNTER — Encounter: Payer: Self-pay | Admitting: Family Medicine

## 2023-12-22 VITALS — BP 137/75 | HR 100 | Temp 97.8°F | Ht 63.0 in | Wt 169.4 lb

## 2023-12-22 DIAGNOSIS — Z01419 Encounter for gynecological examination (general) (routine) without abnormal findings: Secondary | ICD-10-CM

## 2023-12-22 DIAGNOSIS — Z1231 Encounter for screening mammogram for malignant neoplasm of breast: Secondary | ICD-10-CM | POA: Diagnosis not present

## 2023-12-22 DIAGNOSIS — Z124 Encounter for screening for malignant neoplasm of cervix: Secondary | ICD-10-CM

## 2023-12-22 NOTE — Assessment & Plan Note (Addendum)
 Routine pap today. No abnormalities on exam. Follow up in 5 years or sooner if indicated. She no longer has menstrual cycles. Mammogram reordered due to not being contacted yet for scheduling. Declines vaccines today. No further concerns today. Ms Rivard is a patient of my partner and receives other routine medical care from him.

## 2023-12-22 NOTE — Progress Notes (Signed)
 Subjective:   [name] is a 48 y.o. female for annual routine Pap and checkup. Current Outpatient Medications  Medication Sig Dispense Refill   albuterol  (PROAIR  HFA) 108 (90 Base) MCG/ACT inhaler 2 PUFFS EVERY 6 HOURS AS NEEDED for shortness of breath 3 each 3   brimonidine  (ALPHAGAN ) 0.2 % ophthalmic solution 1 drop 2 (two) times daily.     carvedilol  (COREG ) 25 MG tablet TAKE 1 TABLET BY MOUTH ONCE DAILY AT  6AM 90 tablet 0   Continuous Glucose Receiver (DEXCOM G7 RECEIVER) DEVI SMARTSIG:Once     Continuous Glucose Sensor (DEXCOM G7 SENSOR) MISC 1 Device by Does not apply route every 14 (fourteen) days. 2 each 11   Evolocumab  (REPATHA  SURECLICK) 140 MG/ML SOAJ Inject 140 mg into the skin every 14 (fourteen) days. 2 mL 2   Fluticasone  Furoate 100 MCG/ACT AEPB Inhale 2 puffs into the lungs 2 (two) times daily. 30 each 11   Glucose Blood (BLOOD GLUCOSE TEST STRIPS 333) STRP 1 strip by In Vitro route in the morning, at noon, and at bedtime. 300 strip 3   insulin  lispro (HUMALOG ) 100 UNIT/ML injection INJECT 3 TO 8 UNITS SUBCUTANEOUSLY THREE TIMES DAILY WITH MEALS 30 mL 3   LANTUS  SOLOSTAR 100 UNIT/ML Solostar Pen INJECT 35 UNITS SUBQ ONCE DAILY.   INCREASE BY 1 UNIT EACH DAY UNTIL FASTING BLOOD GLUCOSE IS LESS THAN 130. THEN HOLD AT THAT DOSE 30 mL 0   LANTUS  SOLOSTAR 100 UNIT/ML Solostar Pen INJECT 35 UNITS SUBCUTANEOUSLY ONCE DAILY 30 mL 0   losartan  (COZAAR ) 100 MG tablet Take 1 tablet by mouth once daily 90 tablet 0   montelukast  (SINGULAIR ) 10 MG tablet Take 1 tablet (10 mg total) by mouth at bedtime. 30 tablet 11   OVER THE COUNTER MEDICATION Take 2 tablets by mouth daily as needed (restless legs). Restless leg relief     terbinafine  (LAMISIL ) 250 MG tablet Take 1 tablet by mouth once daily 30 tablet 0   tirzepatide  (MOUNJARO ) 5 MG/0.5ML Pen Inject 5 mg into the skin once a week. 2 mL 0   No current facility-administered medications for this visit.   Allergies: Fish-derived  products, Demerol , Morphine  and codeine , and Zocor [simvastatin]  No LMP recorded. (Menstrual status: Perimenopausal). Past Medical History:  Diagnosis Date   Asthma    Carpal tunnel syndrome on left    Complication of anesthesia 07/2014   Mouth was swollen on inside, Front tooth was chipped   Diabetes mellitus    19 years ago   Diabetic neuropathy (HCC)    Diabetic retinopathy (HCC)    Essential hypertension 12/23/2015   GERD (gastroesophageal reflux disease)    Headache    Migraine- a long time ago   Hypercholesteremia    Hyperlipidemia 12/23/2015   Hypertension    Muscle spasms of lower extremity    legs and weakness   Pneumonia    Restless leg    Statin myopathy    Type 1 diabetes mellitus (HCC)    Vitreous hemorrhage (HCC) 08/08/2014   Vitreous hemorrhage of right eye (HCC) 08/28/2014   Vitreous hemorrhage, left eye (HCC)    Past Surgical History:  Procedure Laterality Date   CARDIAC CATHETERIZATION N/A 12/27/2015   Procedure: Left Heart Cath and Coronary Angiography;  Surgeon: Debby DELENA Sor, MD;  [facility];  Service: Cardiovascular;  Laterality: N/A;   CESAREAN SECTION  1993, 1998, 2002   COLONOSCOPY WITH PROPOFOL  N/A 06/16/2021   Procedure: COLONOSCOPY WITH  PROPOFOL ;  Surgeon: Cindie Carlin POUR, DO;  [facility];  Service: Endoscopy;  Laterality: N/A;  12:45 / ASA 2   DILATION AND CURETTAGE OF UTERUS  1997   GAS/FLUID EXCHANGE Right 08/28/2014   Procedure: GAS/FLUID EXCHANGE;  Surgeon: Norleen JONETTA Ku, MD;  Location: [facility] OR;  Service: Ophthalmology;  Laterality: Right;   LASER PHOTO ABLATION     left x 2, right x 1   LASER PHOTO ABLATION Right 08/28/2014   Procedure: LASER PHOTO ABLATION;  Surgeon: Norleen JONETTA Ku, MD;  Location: [facility] OR;  Service: Ophthalmology;  Laterality: Right;   MEMBRANE PEEL Right 08/28/2014   Procedure: MEMBRANE PEEL;  Surgeon: Norleen JONETTA Ku, MD;  [facility] OR;  Service: Ophthalmology;  Laterality: Right;   PARS  PLANA VITRECTOMY Right 08/28/2014   Procedure: PARS PLANA VITRECTOMY WITH 25 GAUGE;  Surgeon: Norleen JONETTA Ku, MD;  [facility] OR;  Service: Ophthalmology;  Laterality: Right;   PARS PLANA VITRECTOMY Left 12/24/2014   Procedure: LEFT PARS PLANA VITRECTOMY WITH 25 GAUGE WITH ENDOLASER ;  Surgeon: Arley DELENA Ruder, MD;  Location[facility] OR;  Service: Ophthalmology;  Laterality: Left;   POLYPECTOMY  06/16/2021   Procedure: POLYPECTOMY;  Surgeon: Cindie Carlin POUR, DO;  [facility];  Service: Endoscopy;;   TUBAL LIGATION  2002   WISDOM TOOTH EXTRACTION     age 69     ROS:  Feeling well. No dyspnea or chest pain on exertion.  No abdominal pain, change in bowel habits, black or bloody stools.  No urinary tract symptoms. GYN ROS: no breast pain or new or enlarging lumps on self exam, no vaginal bleeding. No neurological complaints.  Objective:   The patient appears well, alert, oriented x 3, in no distress. BP 137/75   Pulse 100   Temp 97.8 F (36.6 C)   Ht 5' 3 (1.6 m)   Wt 169 lb 6.4 oz (76.8 kg)   SpO2 98%   BMI 30.01 kg/m  ENT normal.  Neck supple. No adenopathy or thyromegaly. PERLA. Lungs are clear, good air entry, no wheezes, rhonchi or rales. S1 and S2 normal, no murmurs, regular rate and rhythm. Abdomen soft without tenderness, guarding, mass or organomegaly. Extremities show no edema, normal peripheral pulses. Neurological is normal, no focal findings.  BREAST EXAM: breasts appear normal, no suspicious masses, no skin or nipple changes or axillary nodes  PELVIC EXAM: normal external genitalia, vulva, vagina, cervix, uterus and adnexa, PAP: Pap smear done today, exam chaperoned by Jesusa Bustle  Assessment & Plan:   well woman  PLAN:  mammogram pap smear    Well woman exam Assessment & Plan: Routine pap today. No abnormalities on exam. Follow up in 5 years or sooner if indicated. She no longer has menstrual cycles. Mammogram reordered due to not being contacted yet  for scheduling. Declines vaccines today. No further concerns today. Ms Riffel is a patient of my partner and receives other routine medical care from him.   Orders: -     Pap, TP Imaging w/ CT/GC and w/ HPV RNA, rflx HPV Type 16/18  Cervical cancer screening -     Pap, TP Imaging w/ CT/GC and w/ HPV RNA, rflx HPV Type 16/18  Encounter for screening mammogram for malignant neoplasm of breast -     Digital Screening Mammogram, Left and Right; Future     Follow up plan: Return for as scheduled with PCP.  Jeoffrey GORMAN Barrio, FNP

## 2023-12-24 ENCOUNTER — Other Ambulatory Visit: Payer: Self-pay | Admitting: Family Medicine

## 2023-12-24 ENCOUNTER — Telehealth: Payer: Self-pay

## 2023-12-24 MED ORDER — TIRZEPATIDE 7.5 MG/0.5ML ~~LOC~~ SOAJ: 7.5000 mg | SUBCUTANEOUS | 2 refills | Status: DC

## 2023-12-24 NOTE — Telephone Encounter (Signed)
 Copied from CRM [id number]. Topic: Clinical - Medication Question >> Dec 24, 2023  2:03 PM Avram MATSU wrote: Reason for CRM: husband is calling for his wife and would like to increase the dose for tirzepatide  (MOUNJARO ) 5 MG/0.5ML Pen [[id number]]

## 2023-12-27 ENCOUNTER — Other Ambulatory Visit: Payer: Self-pay | Admitting: Family Medicine

## 2023-12-28 LAB — C. TRACHOMATIS/N. GONORRHOEAE RNA
C. trachomatis RNA, TMA: NOT DETECTED
N. gonorrhoeae RNA, TMA: NOT DETECTED

## 2023-12-28 LAB — PAP, TP IMAGING W/ HPV RNA, RFLX HPV TYPE 16,18/45: HPV DNA High Risk: DETECTED — AB

## 2023-12-28 LAB — PAP, TP IMAGING W/ CT/GC AND W/ HPV RNA, RFLX HPV TYPE 16/18

## 2023-12-28 LAB — HPV TYPE 16 AND 18/45 RNA
HPV Type 16 RNA: NOT DETECTED
HPV Type 18/45 RNA: NOT DETECTED

## 2023-12-28 NOTE — Telephone Encounter (Signed)
 Requested Prescriptions  Pending Prescriptions Disp Refills   carvedilol  (COREG ) 25 MG tablet [Pharmacy Med Name: Carvedilol  25 MG Oral Tablet] 90 tablet 1    Sig: TAKE 1 TABLET BY MOUTH ONCE DAILY AT  6  AM     Cardiovascular: Beta Blockers 3 Passed - 12/28/2023  4:47 PM      Passed - Cr in normal range and within 360 days    Creat  Date Value Ref Range Status  11/01/2023 0.69 0.50 - 0.99 mg/dL Final   Creatinine, Urine  Date Value Ref Range Status  11/01/2023 106 20 - 275 mg/dL Final         Passed - AST in normal range and within 360 days    AST  Date Value Ref Range Status  11/01/2023 20 10 - 35 U/L Final         Passed - ALT in normal range and within 360 days    ALT  Date Value Ref Range Status  11/01/2023 28 6 - 29 U/L Final         Passed - Last BP in normal range    BP Readings from Last 1 Encounters:  12/22/23 137/75         Passed - Last Heart Rate in normal range    Pulse Readings from Last 1 Encounters:  12/22/23 100         Passed - Valid encounter within last 6 months    Recent Outpatient Visits           6 days ago Well woman exam   [place] [facility] Kayla Jeoffrey RAMAN, FNP   1 month ago Type 1 diabetes mellitus with other circulatory complication [facility])   [place] [facility] Duanne Butler DASEN, MD   5 months ago ASCVD (arteriosclerotic cardiovascular disease)   [place] [facility] Duanne Butler DASEN, MD   8 months ago Diabetes mellitus type 1, controlled, insulin  dependent [facility])   [place] [facility] Duanne Butler DASEN, MD   11 months ago Diabetes mellitus type 1, controlled, insulin  dependent [facility])    [facility] Pickard, Butler DASEN, MD

## 2023-12-29 ENCOUNTER — Ambulatory Visit: Payer: Self-pay | Admitting: Family Medicine

## 2023-12-30 ENCOUNTER — Other Ambulatory Visit: Payer: Self-pay | Admitting: Family Medicine

## 2024-01-18 ENCOUNTER — Other Ambulatory Visit: Payer: Self-pay | Admitting: Family Medicine

## 2024-01-20 ENCOUNTER — Other Ambulatory Visit: Payer: Self-pay | Admitting: Family Medicine

## 2024-01-20 NOTE — Telephone Encounter (Signed)
 Patient needing next dosage to be sent to pharmacy below. Took last injection yesterday.

## 2024-01-20 NOTE — Telephone Encounter (Signed)
 Copied from CRM #[id number]. Topic: Clinical - Medication Refill >> Jan 20, 2024  2:13 PM Carla L wrote: Medication: tirzepatide  (MOUNJARO ) 7.5 MG/0.5ML Pen Patient needing next dosage to be sent to pharmacy below. Took last injection yesterday.   Has the patient contacted their pharmacy? Yes  This is the patient's preferred pharmacy:  [facility] - [place], [place] - [address] [place] [address] Phone: [phone] Fax: [phone]  Is this the correct pharmacy for this prescription? Yes  Has the prescription been filled recently? No  Is the patient out of the medication? Yes  Has the patient been seen for an appointment in the last year OR does the patient have an upcoming appointment? Yes  Can we respond through MyChart? No  Agent: Please be advised that Rx refills may take up to 3 business days. We ask that you follow-up with your pharmacy.

## 2024-01-21 NOTE — Telephone Encounter (Signed)
 Requested medications are due for refill today.  Pt is requesting next higher dose  Requested medications are on the active medications list.  yes  Last refill.  6 mL 2rf 12/24/2023    Future visit scheduled.   yes  Notes to clinic.  Medication not assigned to a protocol. Please review for refill.     Requested Prescriptions  Pending Prescriptions Disp Refills   tirzepatide  (MOUNJARO ) 7.5 MG/0.5ML Pen 6 mL 2    Sig: Inject 7.5 mg into the skin once a week.     Off-Protocol Failed - 01/21/2024  5:14 PM      Failed - Medication not assigned to a protocol, review manually.      Passed - Valid encounter within last 12 months    Recent Outpatient Visits           1 month ago Well woman exam   [place] [facility] Kayla Jeoffrey RAMAN, FNP   2 months ago Type 1 diabetes mellitus with other circulatory complication [facility])   [place] [facility] Duanne, Butler DASEN, MD   6 months ago ASCVD (arteriosclerotic cardiovascular disease)   [place] [facility] Duanne Butler DASEN, MD   9 months ago Diabetes mellitus type 1, controlled, insulin  dependent [facility])   [place] [facility] Duanne Butler DASEN, MD   1 year ago Diabetes mellitus type 1, controlled, insulin  dependent [facility])   [place] [facility] Pickard, Butler DASEN, MD

## 2024-01-25 MED ORDER — TIRZEPATIDE 7.5 MG/0.5ML ~~LOC~~ SOAJ: 7.5000 mg | SUBCUTANEOUS | 2 refills | Status: DC

## 2024-02-03 ENCOUNTER — Ambulatory Visit: Admitting: Family Medicine

## 2024-02-03 ENCOUNTER — Encounter: Payer: Self-pay | Admitting: Family Medicine

## 2024-02-03 VITALS — BP 120/68 | HR 97 | Temp 97.9°F | Ht 63.0 in | Wt 162.6 lb

## 2024-02-03 DIAGNOSIS — Z794 Long term (current) use of insulin: Secondary | ICD-10-CM

## 2024-02-03 DIAGNOSIS — E7800 Pure hypercholesterolemia, unspecified: Secondary | ICD-10-CM | POA: Diagnosis not present

## 2024-02-03 DIAGNOSIS — E1059 Type 1 diabetes mellitus with other circulatory complications: Secondary | ICD-10-CM

## 2024-02-03 DIAGNOSIS — I2510 Atherosclerotic heart disease of native coronary artery without angina pectoris: Secondary | ICD-10-CM | POA: Diagnosis not present

## 2024-02-03 NOTE — Progress Notes (Signed)
 Subjective:    Patient ID: [name], female    DOB: [birth year], 48 y.o.   MRN: [id number] Since I last saw the patient, she had a Pap smear.  HPV was found however there were no cancer cells.  Therefore we will check that again in a year.  She has reduced her smoking.  She is down to half a pack a day.  She is due for lung cancer screening as well as breast cancer screening.  She declines a CT scan to screen for lung cancer.  On a positive note, the patient's insurance has finally agreed to pay for Repatha .  I am very excited to see what her cholesterol is.  It has been very bad for years.  She also started taking Mounjaro .  For the last 90 days, the patient's blood sugars have been in range 89% of the time!.  Her anticipated A1c is 6.4.  She has been able to reduce her Lantus  to 20 units which is outstanding.  She is not having to take her rapid insulin  hardly at all.  Past Medical History:  Diagnosis Date   Asthma    Carpal tunnel syndrome on left    Complication of anesthesia 07/2014   Mouth was swollen on inside, Front tooth was chipped   Diabetes mellitus    19 years ago   Diabetic neuropathy (HCC)    Diabetic retinopathy (HCC)    Essential hypertension 12/23/2015   GERD (gastroesophageal reflux disease)    Headache    Migraine- a long time ago   Hypercholesteremia    Hyperlipidemia 12/23/2015   Hypertension    Muscle spasms of lower extremity    legs and weakness   Pneumonia    Restless leg    Statin myopathy    Type 1 diabetes mellitus (HCC)    Vitreous hemorrhage (HCC) 08/08/2014   Vitreous hemorrhage of right eye (HCC) 08/28/2014   Vitreous hemorrhage, left eye (HCC)    Past Surgical History:  Procedure Laterality Date   CARDIAC CATHETERIZATION N/A 12/27/2015   Procedure: Left Heart Cath and Coronary Angiography;  Surgeon: Debby DELENA Sor, MD;  [facility];  Service: Cardiovascular;  Laterality: N/A;   CESAREAN SECTION  1993, 1998, 2002    COLONOSCOPY WITH PROPOFOL  N/A 06/16/2021   Procedure: COLONOSCOPY WITH PROPOFOL ;  Surgeon: Cindie Carlin POUR, DO;  [facility];  Service: Endoscopy;  Laterality: N/A;  12:45 / ASA 2   DILATION AND CURETTAGE OF UTERUS  1997   GAS/FLUID EXCHANGE Right 08/28/2014   Procedure: GAS/FLUID EXCHANGE;  Surgeon: Norleen JONETTA Ku, MD;  Location: [facility] OR;  Service: Ophthalmology;  Laterality: Right;   LASER PHOTO ABLATION     left x 2, right x 1   LASER PHOTO ABLATION Right 08/28/2014   Procedure: LASER PHOTO ABLATION;  Surgeon: Norleen JONETTA Ku, MD;  Location: [facility] OR;  Service: Ophthalmology;  Laterality: Right;   MEMBRANE PEEL Right 08/28/2014   Procedure: MEMBRANE PEEL;  Surgeon: Norleen JONETTA Ku, MD;  Location: [facility] OR;  Service: Ophthalmology;  Laterality: Right;   PARS PLANA VITRECTOMY Right 08/28/2014   Procedure: PARS PLANA VITRECTOMY WITH 25 GAUGE;  Surgeon: Norleen JONETTA Ku, MD;  Location: [facility] OR;  Service: Ophthalmology;  Laterality: Right;   PARS PLANA VITRECTOMY Left 12/24/2014   Procedure: LEFT PARS PLANA VITRECTOMY WITH 25 GAUGE WITH ENDOLASER ;  Surgeon: Arley DELENA Ruder, MD;  Location: [facility] OR;  Service: Ophthalmology;  Laterality: Left;   POLYPECTOMY  06/16/2021   Procedure: POLYPECTOMY;  Surgeon: Cindie Carlin POUR, DO;  [facility];  Service: Endoscopy;;   TUBAL LIGATION  2002   WISDOM TOOTH EXTRACTION     age 71   Current Outpatient Medications on File Prior to Visit  Medication Sig Dispense Refill   albuterol  (PROAIR  HFA) 108 (90 Base) MCG/ACT inhaler 2 PUFFS EVERY 6 HOURS AS NEEDED for shortness of breath 3 each 3   brimonidine  (ALPHAGAN ) 0.2 % ophthalmic solution 1 drop 2 (two) times daily.     carvedilol  (COREG ) 25 MG tablet TAKE 1 TABLET BY MOUTH ONCE DAILY AT  6  AM 90 tablet 1   Continuous Glucose Receiver (DEXCOM G7 RECEIVER) DEVI SMARTSIG:Once     Continuous Glucose Sensor (DEXCOM G7 SENSOR) MISC APPLY 1 SENSOR EVERY 10 DAYS 3 each 0   Evolocumab  (REPATHA  SURECLICK) 140  MG/ML SOAJ Inject 140 mg into the skin every 14 (fourteen) days. 2 mL 2   Fluticasone  Furoate 100 MCG/ACT AEPB Inhale 2 puffs into the lungs 2 (two) times daily. 30 each 11   Glucose Blood (BLOOD GLUCOSE TEST STRIPS 333) STRP 1 strip by In Vitro route in the morning, at noon, and at bedtime. 300 strip 3   insulin  glargine (LANTUS  SOLOSTAR) 100 UNIT/ML Solostar Pen INJECT 35 UNITS SUBCUTANEOUSLY ONCE DAILY 30 mL 6   insulin  lispro (HUMALOG ) 100 UNIT/ML injection INJECT 3 TO 8 UNITS SUBCUTANEOUSLY THREE TIMES DAILY WITH MEALS 30 mL 3   LANTUS  SOLOSTAR 100 UNIT/ML Solostar Pen INJECT 35 UNITS SUBQ ONCE DAILY.   INCREASE BY 1 UNIT EACH DAY UNTIL FASTING BLOOD GLUCOSE IS LESS THAN 130. THEN HOLD AT THAT DOSE 30 mL 0   losartan  (COZAAR ) 100 MG tablet Take 1 tablet by mouth once daily 90 tablet 0   montelukast  (SINGULAIR ) 10 MG tablet Take 1 tablet (10 mg total) by mouth at bedtime. 30 tablet 11   OVER THE COUNTER MEDICATION Take 2 tablets by mouth daily as needed (restless legs). Restless leg relief     terbinafine  (LAMISIL ) 250 MG tablet Take 1 tablet by mouth once daily 30 tablet 0   tirzepatide  (MOUNJARO ) 7.5 MG/0.5ML Pen Inject 7.5 mg into the skin once a week. 6 mL 2   No current facility-administered medications on file prior to visit.   Allergies  Allergen Reactions   Fish Protein-Containing Drug Products Hives and Shortness Of Breath   Demerol  Itching and Rash   Morphine  And Codeine  Itching and Rash        Zocor [Simvastatin] Itching and Other (See Comments)    Leg pain   Social History   Socioeconomic History   Marital status: Married    Spouse name: Not on file   Number of children: Not on file   Years of education: Not on file   Highest education level: Not on file  Occupational History   Not on file  Tobacco Use   Smoking status: Every Day    Current packs/day: 2.00    Average packs/day: 2.0 packs/day for 9.0 years (18.0 ttl pk-yrs)    Types: Cigarettes   Smokeless  tobacco: Never  Substance and Sexual Activity   Alcohol use: No   Drug use: No   Sexual activity: Yes    Partners: Male    Birth control/protection: None, Surgical    Comment: tubal   Other Topics Concern   Not on file  Social History Narrative   Married, lives with spouse and 2 kids  Occupation: building surveyor   Social Drivers of Corporate Investment Banker Strain: Not on file  Food Insecurity: Not on file  Transportation Needs: Not on file  Physical Activity: Not on file  Stress: Not on file  Social Connections: Not on file  Intimate Partner Violence: Not on file     Review of Systems  All other systems reviewed and are negative.      Objective:   Physical Exam Vitals reviewed.  Constitutional:      Appearance: She is well-developed.  Neck:     Thyroid: No thyromegaly.  Cardiovascular:     Rate and Rhythm: Normal rate and regular rhythm.     Heart sounds: Normal heart sounds. No murmur heard. Pulmonary:     Effort: Pulmonary effort is normal. No respiratory distress.     Breath sounds: No decreased breath sounds, wheezing, rhonchi or rales.  Abdominal:     General: Bowel sounds are normal. There is no distension.     Palpations: Abdomen is soft.     Tenderness: There is no abdominal tenderness. There is no guarding or rebound.  Musculoskeletal:     Cervical back: Neck supple.  Lymphadenopathy:     Cervical: No cervical adenopathy.           Assessment & Plan:  Type 1 diabetes mellitus with other circulatory complication (HCC) - Plan: CBC with Differential/Platelet, Comprehensive metabolic panel with GFR, Lipid panel, Microalbumin / creatinine urine ratio, Hemoglobin A1c  ASCVD (arteriosclerotic cardiovascular disease)  Pure hypercholesterolemia I am extremely proud of the patient's blood sugars.  The sound outstanding.  Continue to fuss at her about smoking.  Recommended a mammogram as well as a CT scan of the lungs to screen for lung cancer.  Pap  smear is up-to-date.  Check cholesterol.  Goal LDL cholesterol is less than 100.  I anticipate that her A1c will be less than 7.  Continue Mounjaro  as she has had tremendous success with this.  I congratulated the patient

## 2024-02-04 ENCOUNTER — Ambulatory Visit: Payer: Self-pay | Admitting: Family Medicine

## 2024-02-04 LAB — CBC WITH DIFFERENTIAL/PLATELET
Absolute Lymphocytes: 2913 {cells}/uL (ref 850–3900)
Absolute Monocytes: 481 {cells}/uL (ref 200–950)
Basophils Absolute: 33 {cells}/uL (ref 0–200)
Basophils Relative: 0.4 %
Eosinophils Absolute: 100 {cells}/uL (ref 15–500)
Eosinophils Relative: 1.2 %
HCT: 46.7 % — ABNORMAL HIGH (ref 35.0–45.0)
Hemoglobin: 15.5 g/dL (ref 11.7–15.5)
MCH: 30.5 pg (ref 27.0–33.0)
MCHC: 33.2 g/dL (ref 32.0–36.0)
MCV: 91.9 fL (ref 80.0–100.0)
MPV: 12 fL (ref 7.5–12.5)
Monocytes Relative: 5.8 %
Neutro Abs: 4773 {cells}/uL (ref 1500–7800)
Neutrophils Relative %: 57.5 %
Platelets: 174 Thousand/uL (ref 140–400)
RBC: 5.08 Million/uL (ref 3.80–5.10)
RDW: 12.2 % (ref 11.0–15.0)
Total Lymphocyte: 35.1 %
WBC: 8.3 Thousand/uL (ref 3.8–10.8)

## 2024-02-04 LAB — COMPREHENSIVE METABOLIC PANEL WITH GFR
AG Ratio: 1.8 (calc) (ref 1.0–2.5)
ALT: 28 U/L (ref 6–29)
AST: 21 U/L (ref 10–35)
Albumin: 4.4 g/dL (ref 3.6–5.1)
Alkaline phosphatase (APISO): 56 U/L (ref 37–153)
BUN: 16 mg/dL (ref 7–25)
CO2: 27 mmol/L (ref 20–32)
Calcium: 9.3 mg/dL (ref 8.6–10.4)
Chloride: 107 mmol/L (ref 98–110)
Creat: 0.79 mg/dL (ref 0.50–1.03)
Globulin: 2.4 g/dL (ref 1.9–3.7)
Glucose, Bld: 135 mg/dL — ABNORMAL HIGH (ref 65–99)
Potassium: 4.4 mmol/L (ref 3.5–5.3)
Sodium: 141 mmol/L (ref 135–146)
Total Bilirubin: 0.5 mg/dL (ref 0.2–1.2)
Total Protein: 6.8 g/dL (ref 6.1–8.1)
eGFR: 91 mL/min/1.73m2 (ref >=60)

## 2024-02-04 LAB — HEMOGLOBIN A1C
Hgb A1c MFr Bld: 6.3 % — ABNORMAL HIGH (ref <5.7)
Mean Plasma Glucose: 134 mg/dL
eAG (mmol/L): 7.4 mmol/L

## 2024-02-04 LAB — LIPID PANEL
Cholesterol: 224 mg/dL — ABNORMAL HIGH (ref <200)
HDL: 26 mg/dL — ABNORMAL LOW (ref >=50)
LDL Cholesterol (Calc): 171 mg/dL — ABNORMAL HIGH
Non-HDL Cholesterol (Calc): 198 mg/dL — ABNORMAL HIGH (ref <130)
Total CHOL/HDL Ratio: 8.6 (calc) — ABNORMAL HIGH (ref <5.0)
Triglycerides: 132 mg/dL (ref <150)

## 2024-02-04 LAB — MICROALBUMIN / CREATININE URINE RATIO
Creatinine, Urine: 138 mg/dL (ref 20–275)
Microalb Creat Ratio: 4 mg/g{creat} (ref <30)
Microalb, Ur: 0.5 mg/dL

## 2024-02-15 ENCOUNTER — Other Ambulatory Visit: Payer: Self-pay | Admitting: Family Medicine

## 2024-02-17 ENCOUNTER — Telehealth: Payer: Self-pay | Admitting: Family Medicine

## 2024-02-17 NOTE — Telephone Encounter (Signed)
 Requested Prescriptions  Pending Prescriptions Disp Refills   Continuous Glucose Sensor (DEXCOM G7 SENSOR) MISC [Pharmacy Med Name: Dexcom G7 Sensor Miscellaneous] 3 each 0    Sig: APPLY 1 SENSOR EVERY 10 DAYS     Endocrinology: Diabetes - Testing Supplies Passed - 02/17/2024  2:16 PM      Passed - Valid encounter within last 12 months    Recent Outpatient Visits           2 weeks ago Type 1 diabetes mellitus with other circulatory complication [facility])   [place] [facility] Pickard, Butler DASEN, MD   1 month ago Well woman exam   [place] [facility] Kayla Jeoffrey RAMAN, FNP   3 months ago Type 1 diabetes mellitus with other circulatory complication [facility])   [place] [facility] Duanne Butler DASEN, MD   7 months ago ASCVD (arteriosclerotic cardiovascular disease)   [place] [facility] Duanne Butler DASEN, MD   10 months ago Diabetes mellitus type 1, controlled, insulin  dependent [facility])   [place] [facility] Pickard, Butler DASEN, MD

## 2024-02-17 NOTE — Telephone Encounter (Signed)
 Copied from CRM #[id number]. Topic: Clinical - Medication Question >> Feb 17, 2024  3:01 PM Delon DASEN wrote: Reason for CRM: tirzepatide  (MOUNJARO ) 7.5 MG/0.5ML Pen- patient is asking to move up to next dosage level- husband Evalene [phone]

## 2024-02-18 ENCOUNTER — Other Ambulatory Visit: Payer: Self-pay

## 2024-02-18 DIAGNOSIS — E1059 Type 1 diabetes mellitus with other circulatory complications: Secondary | ICD-10-CM

## 2024-02-18 MED ORDER — TIRZEPATIDE 10 MG/0.5ML ~~LOC~~ SOAJ: 10.0000 mg | SUBCUTANEOUS | 0 refills | Status: DC

## 2024-03-12 ENCOUNTER — Other Ambulatory Visit: Payer: Self-pay | Admitting: Family Medicine

## 2024-03-12 DIAGNOSIS — E1059 Type 1 diabetes mellitus with other circulatory complications: Secondary | ICD-10-CM

## 2024-03-14 ENCOUNTER — Other Ambulatory Visit: Payer: Self-pay | Admitting: Family Medicine

## 2024-03-15 ENCOUNTER — Other Ambulatory Visit: Payer: Self-pay | Admitting: Family Medicine

## 2024-04-08 ENCOUNTER — Other Ambulatory Visit: Payer: Self-pay | Admitting: Family Medicine

## 2024-04-08 DIAGNOSIS — E1059 Type 1 diabetes mellitus with other circulatory complications: Secondary | ICD-10-CM

## 2024-04-14 ENCOUNTER — Other Ambulatory Visit: Payer: Self-pay | Admitting: Family Medicine

## 2024-05-03 ENCOUNTER — Other Ambulatory Visit: Payer: Self-pay | Admitting: Family Medicine

## 2024-05-03 DIAGNOSIS — E1059 Type 1 diabetes mellitus with other circulatory complications: Secondary | ICD-10-CM

## 2024-11-28 ENCOUNTER — Ambulatory Visit: Admitting: Family Medicine
